# Patient Record
Sex: Male | Born: 1950 | Race: White | Hispanic: No | Marital: Married | Smoking: Never smoker
Health system: Southern US, Community
[De-identification: ages and names within clinical notes are randomized; demographics above are authoritative.]

## PROBLEM LIST (undated history)

## (undated) DIAGNOSIS — C44300 Unspecified malignant neoplasm of skin of unspecified part of face: Secondary | ICD-10-CM

## (undated) DIAGNOSIS — I712 Thoracic aortic aneurysm, without rupture: Secondary | ICD-10-CM

## (undated) DIAGNOSIS — I7121 Aneurysm of the ascending aorta, without rupture: Secondary | ICD-10-CM

## (undated) DIAGNOSIS — I359 Nonrheumatic aortic valve disorder, unspecified: Secondary | ICD-10-CM

## (undated) DIAGNOSIS — M7742 Metatarsalgia, left foot: Secondary | ICD-10-CM

## (undated) DIAGNOSIS — M25519 Pain in unspecified shoulder: Secondary | ICD-10-CM

## (undated) DIAGNOSIS — I2510 Atherosclerotic heart disease of native coronary artery without angina pectoris: Secondary | ICD-10-CM

## (undated) DIAGNOSIS — E079 Disorder of thyroid, unspecified: Secondary | ICD-10-CM

## (undated) DIAGNOSIS — I480 Paroxysmal atrial fibrillation: Secondary | ICD-10-CM

## (undated) DIAGNOSIS — M542 Cervicalgia: Secondary | ICD-10-CM

## (undated) DIAGNOSIS — J309 Allergic rhinitis, unspecified: Secondary | ICD-10-CM

## (undated) HISTORY — DX: Unspecified malignant neoplasm of skin of unspecified part of face: C44.300

## (undated) HISTORY — PX: EYE SURGERY: SHX253

## (undated) HISTORY — PX: CARDIAC CATHETERIZATION: SHX172

## (undated) HISTORY — DX: Pain in unspecified shoulder: M25.519

## (undated) HISTORY — DX: Metatarsalgia, left foot: M77.42

## (undated) HISTORY — DX: Allergic rhinitis, unspecified: J30.9

## (undated) HISTORY — DX: Cervicalgia: M54.2

---

## 1999-01-08 ENCOUNTER — Encounter: Admission: RE | Admit: 1999-01-08 | Discharge: 1999-01-08 | Payer: Self-pay | Admitting: Family Medicine

## 2000-05-01 ENCOUNTER — Encounter: Admission: RE | Admit: 2000-05-01 | Discharge: 2000-05-01 | Payer: Self-pay | Admitting: Sports Medicine

## 2000-06-01 ENCOUNTER — Encounter: Admission: RE | Admit: 2000-06-01 | Discharge: 2000-06-01 | Payer: Self-pay | Admitting: Sports Medicine

## 2000-06-20 ENCOUNTER — Encounter: Admission: RE | Admit: 2000-06-20 | Discharge: 2000-06-20 | Payer: Self-pay | Admitting: Family Medicine

## 2003-03-06 ENCOUNTER — Encounter: Admission: RE | Admit: 2003-03-06 | Discharge: 2003-03-06 | Payer: Self-pay | Admitting: Sports Medicine

## 2003-07-08 ENCOUNTER — Ambulatory Visit (HOSPITAL_COMMUNITY): Admission: RE | Admit: 2003-07-08 | Discharge: 2003-07-08 | Payer: Self-pay | Admitting: Sports Medicine

## 2006-11-09 DIAGNOSIS — J309 Allergic rhinitis, unspecified: Secondary | ICD-10-CM

## 2006-11-09 HISTORY — DX: Allergic rhinitis, unspecified: J30.9

## 2007-07-11 ENCOUNTER — Encounter: Payer: Self-pay | Admitting: Sports Medicine

## 2009-02-28 ENCOUNTER — Emergency Department (HOSPITAL_COMMUNITY): Admission: EM | Admit: 2009-02-28 | Discharge: 2009-02-28 | Payer: Self-pay | Admitting: Family Medicine

## 2009-02-28 ENCOUNTER — Emergency Department (HOSPITAL_COMMUNITY): Admission: EM | Admit: 2009-02-28 | Discharge: 2009-02-28 | Payer: Self-pay | Admitting: Emergency Medicine

## 2009-05-06 ENCOUNTER — Ambulatory Visit: Payer: Self-pay | Admitting: Sports Medicine

## 2009-05-06 DIAGNOSIS — M25519 Pain in unspecified shoulder: Secondary | ICD-10-CM

## 2009-05-06 DIAGNOSIS — S43109A Unspecified dislocation of unspecified acromioclavicular joint, initial encounter: Secondary | ICD-10-CM | POA: Insufficient documentation

## 2009-05-06 HISTORY — DX: Pain in unspecified shoulder: M25.519

## 2009-06-24 ENCOUNTER — Encounter: Payer: Self-pay | Admitting: Sports Medicine

## 2009-08-03 ENCOUNTER — Encounter: Payer: Self-pay | Admitting: Sports Medicine

## 2009-08-03 ENCOUNTER — Encounter: Admission: RE | Admit: 2009-08-03 | Discharge: 2009-08-03 | Payer: Self-pay | Admitting: Orthopedic Surgery

## 2009-08-04 ENCOUNTER — Encounter: Admission: RE | Admit: 2009-08-04 | Discharge: 2009-08-04 | Payer: Self-pay | Admitting: Orthopedic Surgery

## 2009-08-04 ENCOUNTER — Encounter: Payer: Self-pay | Admitting: Sports Medicine

## 2009-08-12 ENCOUNTER — Encounter: Payer: Self-pay | Admitting: Sports Medicine

## 2009-08-19 ENCOUNTER — Ambulatory Visit: Payer: Self-pay | Admitting: Sports Medicine

## 2009-08-19 DIAGNOSIS — S63529A Sprain of radiocarpal joint of unspecified wrist, initial encounter: Secondary | ICD-10-CM | POA: Insufficient documentation

## 2009-08-19 DIAGNOSIS — C44300 Unspecified malignant neoplasm of skin of unspecified part of face: Secondary | ICD-10-CM

## 2009-08-19 DIAGNOSIS — C44309 Unspecified malignant neoplasm of skin of other parts of face: Secondary | ICD-10-CM | POA: Insufficient documentation

## 2009-08-19 DIAGNOSIS — E041 Nontoxic single thyroid nodule: Secondary | ICD-10-CM | POA: Insufficient documentation

## 2009-08-19 HISTORY — DX: Unspecified malignant neoplasm of skin of unspecified part of face: C44.300

## 2009-08-19 LAB — CONVERTED CEMR LAB
T4, Total: 5.9 ug/dL (ref 5.0–12.5)
TSH: 6.346 microintl units/mL — ABNORMAL HIGH (ref 0.350–4.500)

## 2009-08-25 ENCOUNTER — Ambulatory Visit (HOSPITAL_COMMUNITY): Admission: RE | Admit: 2009-08-25 | Discharge: 2009-08-25 | Payer: Self-pay | Admitting: Sports Medicine

## 2009-08-27 ENCOUNTER — Ambulatory Visit: Payer: Self-pay | Admitting: Sports Medicine

## 2009-10-15 ENCOUNTER — Ambulatory Visit: Payer: Self-pay | Admitting: Family Medicine

## 2010-06-10 ENCOUNTER — Encounter: Payer: Self-pay | Admitting: Sports Medicine

## 2010-11-03 ENCOUNTER — Encounter: Payer: Self-pay | Admitting: *Deleted

## 2011-02-08 ENCOUNTER — Encounter: Payer: Self-pay | Admitting: Sports Medicine

## 2011-02-08 ENCOUNTER — Ambulatory Visit (INDEPENDENT_AMBULATORY_CARE_PROVIDER_SITE_OTHER): Payer: 59 | Admitting: Sports Medicine

## 2011-02-08 VITALS — BP 156/80 | HR 48 | Ht 68.0 in | Wt 135.0 lb

## 2011-02-08 DIAGNOSIS — S86919A Strain of unspecified muscle(s) and tendon(s) at lower leg level, unspecified leg, initial encounter: Secondary | ICD-10-CM | POA: Insufficient documentation

## 2011-02-08 DIAGNOSIS — IMO0002 Reserved for concepts with insufficient information to code with codable children: Secondary | ICD-10-CM

## 2011-02-08 NOTE — Assessment & Plan Note (Addendum)
Muscle strain of likely soleus muscle without evidence of tendon rupture.  Given instruction on eccentric heel exercises.  With excellent doppler flow I think we can hold off NTG at this point.consider restarting this in 2 weeks if no change  would hold running for at least 2 wks and then start slowly Use heel lifts

## 2011-02-08 NOTE — Progress Notes (Signed)
  Subjective:    Patient ID: James Mayo, male    DOB: 1950-09-24, 59 y.o.   MRN: [id number]  HPI 10 day history of right calf pain.  Acute onset while running.  No tripping or twisting, soreness despite icing afterwards.  Has been increasing walk-run and plans to do a triathlon in 2 weeks. Continued pain despite off x 1 week, pain when jogging with tightness in calves. Using some Dr Scholl's heel lifts and these do help.   Review of Systems See HPI    Objective:   Physical Exam Right Ankle/leg: No visible erythema or swelling. Range of motion is full in all directions or ankle, decreased flexibility of achilles No visible bruising or swelling Leg lengths, Right is .75 cm longer than leftresisted testing of post tib, flex hallus and flex digitorum all unremarkable Resisted testing of calf is neg Bent leg heel raise on 1 leg does hurt and fell tight on RT but not with straight leg  MSK [place]: Right Achilles tendon 0.54 At insertion of soleus into AT there is some disruption of distal mm fibers Hypoechoic change noted in this area There is also increased doppler flow AT is intact but is thicker than expected at 5 to 8 cms above insertion       Assessment & Plan:

## 2011-08-16 ENCOUNTER — Other Ambulatory Visit: Payer: Self-pay | Admitting: *Deleted

## 2011-08-16 MED ORDER — ZOSTER VACCINE LIVE 19400 UNT/0.65ML ~~LOC~~ SOLR: 0.6500 mL | Freq: Once | SUBCUTANEOUS | Status: AC

## 2011-11-14 ENCOUNTER — Other Ambulatory Visit: Payer: Self-pay | Admitting: Sports Medicine

## 2012-02-14 ENCOUNTER — Ambulatory Visit: Payer: 59 | Admitting: Sports Medicine

## 2012-02-16 ENCOUNTER — Ambulatory Visit (INDEPENDENT_AMBULATORY_CARE_PROVIDER_SITE_OTHER): Payer: 59 | Admitting: Sports Medicine

## 2012-02-16 VITALS — BP 128/70

## 2012-02-16 DIAGNOSIS — S86919A Strain of unspecified muscle(s) and tendon(s) at lower leg level, unspecified leg, initial encounter: Secondary | ICD-10-CM

## 2012-02-16 DIAGNOSIS — IMO0002 Reserved for concepts with insufficient information to code with codable children: Secondary | ICD-10-CM

## 2012-02-16 NOTE — Progress Notes (Signed)
  Subjective:    Patient ID: James Mayo, male    DOB: [birth year], 61 y.o.   MRN: [id number]  HPI 61 yo has a two month history of a crampy sensation upon training. He describes his training as a minute walk then a three minute jog and interval for 5 times.  He notes that after his second three minute run he starts to have a crampy pain in his right lateral lower leg that start at the lateral knee and radiates to the posterior leg.  The pain lasts for 24 hours and dissipates after 48 hours.     He was first evaluated by me for this one year ago. On ultrasound at that time he had a partial tear of the soleus muscle coming into the portion that comes Achilles tendon. He did cath exercises for while and use some compression and heel lifts. This improved his symptoms but they came back when he started his new running program.  Review of Systems     Objective:   Physical Exam NAD  Right gastrocnemius 1 1/2 cm smaller in circumference than the left gastrocnemius   5/5 strength bil in lower extremities. No loss of sensation in lower extremities bil  Leg length is normal  Cavus feet are noted bilaterally with marked widening of the forefoot  Running gait shows some intoeing of the left foot but not as bad as in the past and is generally neutral except he is not pushing off quite as well on the right leg       Assessment & Plan:

## 2012-02-16 NOTE — Assessment & Plan Note (Signed)
I think his muscle injury may have involved the sural nerve on the right calf as he has developed some significant atrophy His symptoms of tightness start fairly quickly and may represent some increased Sural irritation  We placed him in a calf compression sleeve He is to continue using heel lifts I gave him sports insoles to add a little bit of arch support  Start on calf exercise program and instructed in this  See how he responds to this with a gradual increase in his running as tolerated over the next one to 2 months

## 2013-05-09 ENCOUNTER — Other Ambulatory Visit: Payer: Self-pay | Admitting: Sports Medicine

## 2013-11-08 ENCOUNTER — Encounter: Payer: Self-pay | Admitting: Family Medicine

## 2013-11-08 ENCOUNTER — Ambulatory Visit (INDEPENDENT_AMBULATORY_CARE_PROVIDER_SITE_OTHER): Payer: 59 | Admitting: Family Medicine

## 2013-11-08 VITALS — BP 120/62 | HR 48 | Temp 97.8°F | Resp 16 | Wt 140.1 lb

## 2013-11-08 DIAGNOSIS — M999 Biomechanical lesion, unspecified: Secondary | ICD-10-CM | POA: Insufficient documentation

## 2013-11-08 DIAGNOSIS — M9981 Other biomechanical lesions of cervical region: Secondary | ICD-10-CM

## 2013-11-08 DIAGNOSIS — M542 Cervicalgia: Secondary | ICD-10-CM

## 2013-11-08 HISTORY — DX: Cervicalgia: M54.2

## 2013-11-08 NOTE — Progress Notes (Signed)
Pre visit review using our clinic review tool, if applicable. No additional management support is needed unless otherwise documented below in the visit note. 

## 2013-11-08 NOTE — Assessment & Plan Note (Signed)
Decision today to treat with OMT was based on Physical Exam  After verbal consent patient was treated with HVLA, ME techniques in cervical, thoracic, lumbar and sacral areas  Patient tolerated the procedure well with improvement in symptoms  Patient given exercises, stretches and lifestyle modifications  See medications in patient instructions if given  Patient will follow up in 3 weeks    

## 2013-11-08 NOTE — Patient Instructions (Signed)
Great to see you both Posture exercise heels, butt, shoulder and head against wall for goal for 5 minutes.  Y-T-A hold two seconds at each position one set of 10 reps daily.  Hip flexor stretch one knee down and then pelvis tilt forward feel in back and groin hold 10 seconds and repeat.  Take pills 3 times daily for 3 days.  Ice at end of activity if feeling tight.  Turmeric 500mg  twice daily Vitamin D 20000 IU daily.  Come back after your great

## 2013-11-08 NOTE — Progress Notes (Signed)
  [facility] [place],  [address] Phone: [phone] Subjective:     CC: neck pain left side and now both sides.   YJE:HUDJSHFWYO James Mayo is a 63 y.o. male coming in with complaint of neck pain mostly left-sided. Patient states it's been going on for multiple months. Seems to be worsening. Patient does not remember any true injury. Started more at the junction of the neck and the left shoulder and seem to radiate up towards his head now is bilateral. Denies any radiation in the arms, any numbness or weakness of the upper extremities. Patient states is very difficult to turn his head to the right side secondary to tightness he feels. Patient is an avid athlete and does have a bicycling trip to [place] where she will be riding 1200 miles in a week and states that he does not know if he is going to be able to tolerate it secondary to this pain. Patient rated 7/10 in severity. States that the pain does wake him up at night.     Past medical history, social, surgical and family history all reviewed in electronic medical record.   Review of Systems: No headache, visual changes, nausea, vomiting, diarrhea, constipation, dizziness, abdominal pain, skin rash, fevers, chills, night sweats, weight loss, swollen lymph nodes, body aches, joint swelling, muscle aches, chest pain, shortness of breath, mood changes.   Objective Blood pressure 120/62, pulse 48, temperature 97.8 F (36.6 C), temperature source Oral, resp. rate 16, weight 140 lb 1.9 oz (63.558 kg), SpO2 96.00%.  General: No apparent distress alert and oriented x3 mood and affect normal, dressed appropriately.  HEENT: Pupils equal, extraocular movements intact  Respiratory: Patient's speak in full sentences and does not appear short of breath  Cardiovascular: No lower extremity edema, non tender, no erythema  Skin: Warm dry intact with no signs of infection or rash on extremities or on axial  skeleton.  Abdomen: Soft nontender  Neuro: Cranial nerves II through XII are intact, neurovascularly intact in all extremities with 2+ DTRs and 2+ pulses.  Lymph: No lymphadenopathy of posterior or anterior cervical chain or axillae bilaterally.  Gait normal with good balance and coordination.  MSK:  Non tender with full range of motion and good stability and symmetric strength and tone of shoulders, elbows, wrist, hip, knee and ankles bilaterally.  Neck: Inspection unremarkable. Mild increase in lordosis No palpable stepoffs. Negative Spurling's maneuver. Limited range of motion with rotation to the right as well as side bending bilaterally. Full flexion and extension. Grip strength and sensation normal in bilateral hands Strength good C4 to T1 distribution No sensory change to C4 to T1 Negative Hoffman sign bilaterally Reflexes normal  OMT Physical Exam  Standing structural       Occiput  Shoulder right lower.   Inferior angle of scapula right lower.    Cervical  C2 flexed rotated and side bent left C7 flexed rotated and side bend right  Thoracic T1 rotated and side bend left and extension with elevated first rib T5 extended rotated and side bent left  Lumbar L2 flexed rotated inside that right significantly tight hip psoas muscle.  Sacrum Left on left     Impression and Recommendations:     This case required medical decision making of moderate complexity.

## 2013-11-08 NOTE — Assessment & Plan Note (Signed)
Patient's the pain is likely multifactorial with osteoarthritis as well as muscular skeletal complaints an elevated first rib Patient did respond to osteopathic manipulation. Patient was given different postural exercises and discussed sleeping position. Patient try these interventions and come back and see me again in 3-4 weeks. Likely will continue with osteopathic manipulation a regular basis.

## 2013-11-11 ENCOUNTER — Ambulatory Visit: Payer: 59 | Admitting: Family Medicine

## 2014-01-03 ENCOUNTER — Ambulatory Visit (INDEPENDENT_AMBULATORY_CARE_PROVIDER_SITE_OTHER): Payer: 59 | Admitting: Family Medicine

## 2014-01-03 VITALS — BP 112/72 | HR 55 | Wt 136.0 lb

## 2014-01-03 DIAGNOSIS — M542 Cervicalgia: Secondary | ICD-10-CM

## 2014-01-03 DIAGNOSIS — M999 Biomechanical lesion, unspecified: Secondary | ICD-10-CM

## 2014-01-03 DIAGNOSIS — M9981 Other biomechanical lesions of cervical region: Secondary | ICD-10-CM

## 2014-01-03 NOTE — Patient Instructions (Signed)
Good to see you and welcome back ! Monitor at eye level Two tennis balls duct tape together and put at base of skull and lay on it for 5 minutes if tension felt.  Continue exercises 3 times daily   See me again in 8 weeks.

## 2014-01-03 NOTE — Progress Notes (Signed)
  [facility] [place], [place] [address] Phone: [phone] Subjective:     CC: neck pain followup  James Mayo is a 63 y.o. male coming in  for followup of neck pain. Patient was seen previously and did have osteopathic manipulation as well as given home exercise program working on posture and back strengthening. Patient did go to [place] and did do a very long bicycle ride over the course multiple weeks. Patient was doing his exercises as well and has been back for 2 weeks. Patient states his pain is approximately 85% better and does have much more range of motion of his neck. Patient denies any radiation to his arms or any numbness or any new symptoms. Patient is very happy with the results so far. .     Past medical history, social, surgical and family history all reviewed in electronic medical record.   Review of Systems: No headache, visual changes, nausea, vomiting, diarrhea, constipation, dizziness, abdominal pain, skin rash, fevers, chills, night sweats, weight loss, swollen lymph nodes, body aches, joint swelling, muscle aches, chest pain, shortness of breath, mood changes.   Objective Blood pressure 112/72, pulse 55, weight 136 lb (61.689 kg), SpO2 97.00%.  General: No apparent distress alert and oriented x3 mood and affect normal, dressed appropriately.  HEENT: Pupils equal, extraocular movements intact  Respiratory: Patient's speak in full sentences and does not appear short of breath  Cardiovascular: No lower extremity edema, non tender, no erythema  Skin: Warm dry intact with no signs of infection or rash on extremities or on axial skeleton.  Abdomen: Soft nontender  Neuro: Cranial nerves II through XII are intact, neurovascularly intact in all extremities with 2+ DTRs and 2+ pulses.  Lymph: No lymphadenopathy of posterior or anterior cervical chain or axillae bilaterally.  Gait normal with good balance and  coordination.  MSK:  Non tender with full range of motion and good stability and symmetric strength and tone of shoulders, elbows, wrist, hip, knee and ankles bilaterally.  Neck: Inspection unremarkable. Mild increase in lordosis No palpable stepoffs. Negative Spurling's maneuver. Significant more rotation as well as side bending than previous exam. Patient does have some increased lordosis of the cervical spine. Full flexion and extension. Grip strength and sensation normal in bilateral hands Strength good C4 to T1 distribution No sensory change to C4 to T1 Negative Hoffman sign bilaterally Reflexes normal  OMT Physical Exam   Cervical  C2 flexed rotated and side bent left C7 flexed rotated and side bend right  Thoracic T1 rotated and side bend left and extension with elevated first rib T5 extended rotated and side bent left  Lumbar L2 flexed rotated inside that right significantly tight hip psoas muscle.  Sacrum Left on left     Impression and Recommendations:     This case required medical decision making of moderate complexity.

## 2014-01-04 ENCOUNTER — Encounter: Payer: Self-pay | Admitting: Family Medicine

## 2014-01-04 NOTE — Assessment & Plan Note (Signed)
Patient is doing considerably better. Patient does have likely a significant amount of osteoarthritic changes but I do not think this would change management at this time. Patient is responding well to conservative therapy. Patient will continue with this. Patient will stay active and will followup in 8 weeks for further evaluation and treatment.

## 2014-01-04 NOTE — Assessment & Plan Note (Addendum)
Decision today to treat with OMT was based on Physical Exam  After verbal consent patient was treated with HVLA, ME techniques in cervical, thoracic, lumbar and sacral areas  Patient tolerated the procedure well with improvement in symptoms  Patient given exercises, stretches and lifestyle modifications  See medications in patient instructions if given  Patient will follow up in 8 weeks

## 2014-02-28 ENCOUNTER — Ambulatory Visit: Payer: 59 | Admitting: Family Medicine

## 2014-04-29 ENCOUNTER — Encounter: Payer: Self-pay | Admitting: Sports Medicine

## 2014-04-29 ENCOUNTER — Ambulatory Visit (INDEPENDENT_AMBULATORY_CARE_PROVIDER_SITE_OTHER): Payer: 59 | Admitting: Sports Medicine

## 2014-04-29 VITALS — BP 127/75 | Ht 68.0 in | Wt 140.0 lb

## 2014-04-29 DIAGNOSIS — M7742 Metatarsalgia, left foot: Secondary | ICD-10-CM

## 2014-04-29 DIAGNOSIS — M7750 Other enthesopathy of unspecified foot: Secondary | ICD-10-CM

## 2014-04-29 DIAGNOSIS — M7741 Metatarsalgia, right foot: Secondary | ICD-10-CM

## 2014-04-29 HISTORY — DX: Metatarsalgia, left foot: M77.42

## 2014-04-29 NOTE — Progress Notes (Signed)
   Subjective:    Patient ID: James Mayo, male    DOB: 1951/03/24, 63 y.o.   MRN: [id number]  HPI Mr. Sparr is a 63 year old male who presents for left foot pain and pressure when he is biking beyond 30 miles. He he notes onset of symptoms was approximately one month ago following a bike trip to [place]. He notes that he had been biking at least 70 miles per day for one month continuously. 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 He says that his current pain feels different, as he does not feel a discrete nodule, numbness, tingling, or reproducibility with palpation. He notes no known relieving factors, nor has he tried anything. He does notice that he has a bunion deformity on the left, though this is not the region of his pain.  No past medical history on file. History   Social History  . Marital Status: Married    Spouse Name: N/A    Number of Children: N/A  . Years of Education: N/A   Occupational History  . Not on file.   Social History Main Topics  . Smoking status: Never Smoker   . Smokeless tobacco: Never Used  . Alcohol Use: Not on file  . Drug Use: Not on file  . Sexual Activity: Not on file   Other Topics Concern  . Not on file   Social History Narrative  . No narrative on file    Review of Systems As per history of present illness. 11 point review of systems was performed is otherwise negative.    Objective:   Physical Exam BP 127/75  Ht 5\' 8"  (1.727 m)  Wt 140 lb (63.504 kg)  BMI 21.29 kg/m2 GEN: The patient is well-developed well-nourished male and in no acute distress.  He is awake alert and oriented x3. SKIN: warm and well-perfused, no rash  EXTR: No lower extremity edema or calf tenderness Neuro: Strength 5/5 globally. Sensation intact throughout. DTRs 2/4 bilaterally. No focal deficits. Vasc: +2 bilateral distal pulses. No edema.  MSK: Examination of the  left foot reveals a slight bunion deformity of approximately 20 of valgus deviation at the first MTP joint. He has a slight pes planus bilaterally. Negative metatarsal compression test. No palpable masses in the inter webspace of the metatarsal heads. Healed incision is seen between the second and third metatarsals. No heel pain or pain with passive dorsiflexion of the foot. No pain with standing on tiptoes or with walking barefoot.     Assessment & Plan:  Please see problem based assessment in the problem list.

## 2014-04-29 NOTE — Assessment & Plan Note (Addendum)
Presents only when the patient has been cycling for 30 miles or longer. Without significant numbness or tingling. -Today we applied a metatarsal pad to the inferior surface of his bike sole. We also applied a Morton's neuroma pad between the first and second metatarsal, which the patient reported felt comfortable. -He will continue his cycling and see how these interventions work with her leaving some of the pain and pressure in the area of the left foot. -We will see him back in approximately one month if his symptoms persist or sooner if he desires to be reevaluated.

## 2014-08-05 ENCOUNTER — Ambulatory Visit (INDEPENDENT_AMBULATORY_CARE_PROVIDER_SITE_OTHER): Payer: Commercial Managed Care - PPO | Admitting: Physician Assistant

## 2014-08-05 VITALS — BP 120/62 | HR 49 | Temp 98.2°F | Resp 16 | Ht 67.5 in | Wt 137.0 lb

## 2014-08-05 DIAGNOSIS — Z130 Encounter for screening for diseases of the blood and blood-forming organs and certain disorders involving the immune mechanism: Secondary | ICD-10-CM

## 2014-08-05 DIAGNOSIS — Z1389 Encounter for screening for other disorder: Secondary | ICD-10-CM

## 2014-08-05 DIAGNOSIS — E039 Hypothyroidism, unspecified: Secondary | ICD-10-CM

## 2014-08-05 DIAGNOSIS — Z0000 Encounter for general adult medical examination without abnormal findings: Secondary | ICD-10-CM

## 2014-08-05 DIAGNOSIS — Z13220 Encounter for screening for lipoid disorders: Secondary | ICD-10-CM

## 2014-08-05 DIAGNOSIS — Z1329 Encounter for screening for other suspected endocrine disorder: Secondary | ICD-10-CM

## 2014-08-05 DIAGNOSIS — Z23 Encounter for immunization: Secondary | ICD-10-CM

## 2014-08-05 LAB — CBC WITH DIFFERENTIAL/PLATELET
BASOS PCT: 1 % (ref 0–1)
Basophils Absolute: 0.1 10*3/uL (ref 0.0–0.1)
EOS PCT: 0 % (ref 0–5)
Eosinophils Absolute: 0 10*3/uL (ref 0.0–0.7)
HEMATOCRIT: 42.2 % (ref 39.0–52.0)
HEMOGLOBIN: 14.4 g/dL (ref 13.0–17.0)
LYMPHS PCT: 26 % (ref 12–46)
Lymphs Abs: 1.5 10*3/uL (ref 0.7–4.0)
MCH: 33.5 pg (ref 26.0–34.0)
MCHC: 34.1 g/dL (ref 30.0–36.0)
MCV: 98.1 fL (ref 78.0–100.0)
MONO ABS: 0.4 10*3/uL (ref 0.1–1.0)
MONOS PCT: 7 % (ref 3–12)
MPV: 11.6 fL (ref 9.4–12.4)
NEUTROS ABS: 3.7 10*3/uL (ref 1.7–7.7)
Neutrophils Relative %: 66 % (ref 43–77)
Platelets: 188 10*3/uL (ref 150–400)
RBC: 4.3 MIL/uL (ref 4.22–5.81)
RDW: 13.4 % (ref 11.5–15.5)
WBC: 5.6 10*3/uL (ref 4.0–10.5)

## 2014-08-05 LAB — TSH: TSH: 6.843 u[IU]/mL — AB (ref 0.350–4.500)

## 2014-08-05 LAB — COMPREHENSIVE METABOLIC PANEL
ALT: 16 U/L (ref 0–53)
AST: 27 U/L (ref 0–37)
Albumin: 4.2 g/dL (ref 3.5–5.2)
Alkaline Phosphatase: 50 U/L (ref 39–117)
BILIRUBIN TOTAL: 0.9 mg/dL (ref 0.2–1.2)
BUN: 30 mg/dL — AB (ref 6–23)
CALCIUM: 8.9 mg/dL (ref 8.4–10.5)
CHLORIDE: 103 meq/L (ref 96–112)
CO2: 26 mEq/L (ref 19–32)
CREATININE: 1.07 mg/dL (ref 0.50–1.35)
Glucose, Bld: 96 mg/dL (ref 70–99)
Potassium: 4.3 mEq/L (ref 3.5–5.3)
Sodium: 136 mEq/L (ref 135–145)
Total Protein: 6.1 g/dL (ref 6.0–8.3)

## 2014-08-05 LAB — LIPID PANEL
CHOL/HDL RATIO: 2.4 ratio
CHOLESTEROL: 195 mg/dL (ref 0–200)
HDL: 81 mg/dL (ref >39)
LDL CALC: 101 mg/dL — AB (ref 0–99)
TRIGLYCERIDES: 63 mg/dL (ref <150)
VLDL: 13 mg/dL (ref 0–40)

## 2014-08-05 LAB — T4, FREE: FREE T4: 0.94 ng/dL (ref 0.80–1.80)

## 2014-08-05 NOTE — Progress Notes (Addendum)
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  SUBJECTIVE  Chief Complaint: Annual Exam   History of present illness: James Mayo is a 63 y.o. year old male who presents for an annual exam.   He has not had the seasonal flu vaccine and would like that today.  He is not eligible for pneumococal vaccine at this time.  He has received a shingles vaccine in 2013.    He has a history of hypothyroidism diagnosed in the summer of 2010.  He is not sure if he still needs this medication and would like to have his thyroid checked today.  He does not take an ASA daily.  He is a non smoker.  His blood pressure is well controlled.  He does report that he had some hypertension 8 years ago, but he reduced his salt intake and his BP returned to normal levels, and has been normal since.    He had a colonoscopy two years ago. He was negative for polyps at that time. He was ask to have another screening in ten years     He does not want his prostate screened at this time.    He has been  married over 30 years in a monogamous relationship and denies a history of IV drug use.    He reports exercising 6 days a week at roughly 90 mins per session. He also walks the dog daily.   He has a Paediatric nurse and dentist whom he sees yearly.  He does not have an eye doctor.    He  has a past medical history of SHOULDER PAIN, RIGHT (05/06/2009); Neck pain, bilateral (11/08/2013); BASAL CELL CARCINOMA, FACE (08/19/2009); RHINITIS, ALLERGIC (11/09/2006); BASAL CELL CARCINOMA, FACE (08/19/2009); and Metatarsalgia of left foot (04/29/2014).    He has a current medication list which includes the following prescription(s): levothyroxine.  James Mayo has No Known Allergies. He  reports that he has never smoked. He has never used smokeless tobacco. He  has no sexual activity history on file.  The patient  has no past surgical  history on file.  His family history is not on file.  Review of Systems  Constitutional: Negative for fever, chills, weight loss, malaise/fatigue and diaphoresis.  HENT: Negative for congestion, ear discharge, ear pain, hearing loss, nosebleeds, sore throat and tinnitus.   Eyes: Negative for blurred vision, double vision, photophobia, pain, discharge and redness.  Respiratory: Negative for cough, hemoptysis, shortness of breath, wheezing and stridor.   Cardiovascular: Negative for chest pain, palpitations, orthopnea, leg swelling and PND.  Gastrointestinal: Negative for heartburn, nausea, vomiting, abdominal pain, diarrhea, constipation, blood in stool and melena.  Genitourinary: Negative for dysuria, urgency, frequency, hematuria and flank pain.  Musculoskeletal: Positive for back pain. Negative for myalgias, joint pain, falls and neck pain.  Skin: Negative for itching and rash.  Neurological: Negative for dizziness, tingling, tremors, weakness and headaches.  Endo/Heme/Allergies: Negative for environmental allergies and polydipsia. Does not bruise/bleed easily.  Psychiatric/Behavioral: Negative for depression and suicidal ideas. The patient is not nervous/anxious and does not have insomnia.     OBJECTIVE  Blood pressure 120/62, pulse 49, temperature 98.2 F (36.8 C), temperature source Oral, resp. rate 16, height 5' 7.5" (1.715 m), weight 137 lb (62.143 kg), SpO2 99 %. The patient's body mass index is 21.13 kg/(m^2).  Physical Exam  Constitutional: He is oriented to person, place, and time. He  appears well-developed and well-nourished. No distress.  HENT:  Head: Normocephalic and atraumatic.  Right Ear: Hearing, tympanic membrane and external ear normal.  Left Ear: Hearing, tympanic membrane and external ear normal.  Nose: Nose normal.  Mouth/Throat: Oropharynx is clear and moist. No oropharyngeal exudate.  Eyes: Conjunctivae and EOM are normal. Pupils are equal, round, and reactive  to light. No scleral icterus.  Neck: Normal range of motion. No thyromegaly present.  Cardiovascular: Normal rate, regular rhythm, normal heart sounds and intact distal pulses.   Respiratory: Effort normal and breath sounds normal.  GI: Soft. Bowel sounds are normal.  Musculoskeletal: Normal range of motion.  Lymphadenopathy:    He has no cervical adenopathy.  Neurological: He is alert and oriented to person, place, and time. He has normal reflexes.  Skin: Skin is warm and dry. He is not diaphoretic.  Psychiatric: He has a normal mood and affect. His behavior is normal. Judgment and thought content normal.      Results for orders placed or performed in visit on 08/05/14 (from the past 24 hour(s))  TSH     Status: Abnormal   Collection Time: 08/05/14  1:18 PM  Result Value Ref Range   TSH 6.843 (H) 0.350 - 4.500 uIU/mL   Narrative   Performed at:  [address], [address]                [place], [place] [address]  T4, Free     Status: None   Collection Time: 08/05/14  1:18 PM  Result Value Ref Range   Free T4 0.94 0.80 - 1.80 ng/dL   Narrative   Performed at:  [address], [address]                [place], [place] [address]  Comprehensive metabolic panel     Status: Abnormal   Collection Time: 08/05/14  1:18 PM  Result Value Ref Range   Sodium 136 135 - 145 mEq/L   Potassium 4.3 3.5 - 5.3 mEq/L   Chloride 103 96 - 112 mEq/L   CO2 26 19 - 32 mEq/L   Glucose, Bld 96 70 - 99 mg/dL   BUN 30 (H) 6 - 23 mg/dL   Creat 1.07 0.50 - 1.35 mg/dL   Total Bilirubin 0.9 0.2 - 1.2 mg/dL   Alkaline Phosphatase 50 39 - 117 U/L   AST 27 0 - 37 U/L   ALT 16 0 - 53 U/L   Total Protein 6.1 6.0 - 8.3 g/dL   Albumin 4.2 3.5 - 5.2 g/dL   Calcium 8.9 8.4 - 10.5 mg/dL   Narrative   Performed at:  [address], [address]                [place], [place] [address]  CBC with Differential      Status: None   Collection Time: 08/05/14  1:18 PM  Result Value Ref Range   WBC 5.6 4.0 - 10.5 K/uL   RBC 4.30 4.22 - 5.81 MIL/uL   Hemoglobin 14.4 13.0 - 17.0 g/dL   HCT 42.2 39.0 - 52.0 %   MCV 98.1 78.0 - 100.0 fL   MCH  33.5 26.0 - 34.0 pg   MCHC 34.1 30.0 - 36.0 g/dL   RDW 13.4 11.5 - 15.5 %   Platelets 188 150 - 400 K/uL   Neutrophils Relative % 66 43 - 77 %   Neutro Abs 3.7 1.7 - 7.7 K/uL   Lymphocytes Relative 26 12 - 46 %   Lymphs Abs 1.5 0.7 - 4.0 K/uL   Monocytes Relative 7 3 - 12 %   Monocytes Absolute 0.4 0.1 - 1.0 K/uL   Eosinophils Relative 0 0 - 5 %   Eosinophils Absolute 0.0 0.0 - 0.7 K/uL   Basophils Relative 1 0 - 1 %   Basophils Absolute 0.1 0.0 - 0.1 K/uL   Smear Review Criteria for review not met    MPV 11.6 9.4 - 12.4 fL   Narrative   Performed at:  [address], [address]                [place], [place] [address]  Lipid panel     Status: Abnormal   Collection Time: 08/05/14  1:18 PM  Result Value Ref Range   Cholesterol 195 0 - 200 mg/dL   Triglycerides 63 <150 mg/dL   HDL 81 >39 mg/dL   Total CHOL/HDL Ratio 2.4 Ratio   VLDL 13 0 - 40 mg/dL   LDL Cholesterol 101 (H) 0 - 99 mg/dL   Narrative   Performed at:  [facility], [address]                [place], [place] [address]    ASSESSMENT & PLAN  James Mayo was seen today for annual exam.  Diagnoses and associated orders for this visit:  Physical exam  Screening for cholesterol level - Lipid panel  Screening for nephropathy - Comprehensive metabolic panel  Screening for thyroid disorder - TSH - T4, Free  Screening for deficiency anemia - CBC with Differential -     MCV approaching macrocytosis, however is normal at this time.  It is unknown weather this is chronic or acute.  Patient reports eating one to two large salads with dark leafy green vegetables daily.  Will reevaluate with an CBC in 6 weeks.    Need for  prophylactic vaccination and inoculation against influenza - Flu Vaccine QUAD 36+ mos IM  Hypothyroidism, unspecified hypothyroidism type -     TSH elevated well out of proportion to normal.  Free T4 low normal. After speaking with patient, 75 mcg or levothyroxine was ordered.  Patient advised to call or come back to clinic with symptoms of constipation, dry skin, anxiety and tachycardia.  -      Future orders for TSH and Free T4, 6 weeks from today, have been ordered      The patient was instructed to to call or comeback to clinic as needed, or should symptoms warrant.  Philis Fendt, MHS, PA-C [facility] 08/06/2014 8:34 AM

## 2014-08-05 NOTE — Progress Notes (Signed)
The patient was discussed with me and I agree with the diagnosis and treatment plan.  

## 2014-08-06 ENCOUNTER — Encounter: Payer: Self-pay | Admitting: Physician Assistant

## 2014-08-06 DIAGNOSIS — E039 Hypothyroidism, unspecified: Secondary | ICD-10-CM | POA: Insufficient documentation

## 2014-08-06 MED ORDER — LEVOTHYROXINE SODIUM 75 MCG PO TABS: 75.0000 ug | ORAL_TABLET | Freq: Every day | ORAL | Status: DC

## 2014-08-06 NOTE — Addendum Note (Signed)
Addended by: Tereasa Coop on: 08/06/2014 09:25 AM   Modules accepted: Orders, SmartSet

## 2015-02-26 ENCOUNTER — Ambulatory Visit (INDEPENDENT_AMBULATORY_CARE_PROVIDER_SITE_OTHER): Payer: 59 | Admitting: Sports Medicine

## 2015-02-26 ENCOUNTER — Encounter: Payer: Self-pay | Admitting: Sports Medicine

## 2015-02-26 VITALS — BP 140/79 | Ht 68.0 in | Wt 135.0 lb

## 2015-02-26 DIAGNOSIS — M7741 Metatarsalgia, right foot: Secondary | ICD-10-CM

## 2015-02-26 NOTE — Progress Notes (Signed)
  James Mayo - 64 y.o. male MRN [id number]  Date of birth: Jan 15, 1951  SUBJECTIVE: Including CC, HPI, ROS HISTORY:  CC: Left forefoot pain, reevaluation   HPI: Forefoot pain at begins after approximately 2 hours of riding, 30-35 miles. He first noticed this while on extended bike trip in [place] last year and has been having recurrent symptoms with this throughout the year if going greater than 2 hours. Pain is located between the first and second interspace and described as a match or flame underneath his toe. Pain is better after rest. Does not occur during other times of the day. Has tried additional metatarsal padding without significant improvement. Pain was improved with ibuprofen but is not taking this on a regular basis. No new changes in bike position, shoes or other equipment.  ROS: per HPI   No specialty comments available. Social History   Occupational History  . Not on file.   Social History Main Topics  . Smoking status: Never Smoker   . Smokeless tobacco: Never Used  . Alcohol Use: Not on file  . Drug Use: Not on file  . Sexual Activity: Not on file      Problem  Metatarsalgia of Right Foot   Transverse arch breakdown. Causing "HotSpot" during cycling. Cleat position is neutral/posterior (appropriate). Specialzied shoes.     OBJECTIVE: HT:5\' 8"  (172.7 cm) WT:135 lb (61.236 kg) BMI:20.6 BP:140/79 mmHg HR: bpm TEMP: ( ) RESP:  PHYSICAL EXAM: GENERAL: Adult male, athletic. No acute distress PSYCH: Alert and appropriately interactive. SKIN: No open skin lesions or abnormal skin markings on areas inspected as below VASCULAR: DP & PT pulses 2+/4. No pre-tibial edema NEURO: Lower extremity strength is 5+/5 in all myotomes; sensation is intact to light touch in all dermatomes. BILATERAL FEET:  Significant left transverse arch rate down with onion formation on the left only. Early Morton's callus. Positive Mulder's click bilaterally but nonpainful. Moderate  longitudinal arch that is maintained with weightbearing. Neutral calcaneus at rest with moderate posterior tibialis recruitment .     ASSESSMENT & PLAN: See Patient instructions for additional information Problem List Items Addressed This Visit    Metatarsalgia of right foot - Primary      PROCEDURE NOTE : CUSTOM ORTHOTICS - sample IGLI All Arounder  The patient was fitted for a cushioned IGLI orthotic . Patient was placed in this patient was placed in the prone position ankle was brought to subtalar neutral and carbon orthotic was heated with a heat gun and molded to the foot. Additional transverse arch button on left was added in ground down to comfort . No other additional postings provided. Patient reported comfort will at rest and will trial these bike ride today. >50% of this 30 minute visit spent in direct patient counseling and/or coordination of care.   FOLLOW UP:  Return if symptoms worsen or fail to improve.

## 2015-07-22 ENCOUNTER — Other Ambulatory Visit: Payer: Self-pay | Admitting: *Deleted

## 2015-07-22 DIAGNOSIS — M79669 Pain in unspecified lower leg: Secondary | ICD-10-CM

## 2015-07-22 DIAGNOSIS — M545 Low back pain, unspecified: Secondary | ICD-10-CM

## 2015-08-17 ENCOUNTER — Other Ambulatory Visit: Payer: Self-pay | Admitting: Physician Assistant

## 2015-08-31 ENCOUNTER — Telehealth: Payer: Self-pay

## 2015-08-31 NOTE — Telephone Encounter (Signed)
-----   Message from Tereasa Coop, PA-C sent at 08/12/2015  9:10 AM EST ----- Regarding: Overdue labs Can we call this patient and find out if he gave [place] blood or if [facility] has dropped the ball?  If he has not given labs then we may need to schedule an appointment to improve compliance.  Philis Fendt, MS, PA-C 9:11 AM, 08/12/2015   ----- Message -----    From: SYSTEM    Sent: 08/12/2015  12:04 AM      To: Tereasa Coop, PA-C

## 2015-08-31 NOTE — Telephone Encounter (Signed)
See previous labs- LMOM to CB

## 2015-09-15 NOTE — Telephone Encounter (Signed)
Spoke with pt. He had forgotten about it. He is going to set up an appt with Legrand Como for a CPE

## 2015-09-16 ENCOUNTER — Ambulatory Visit (INDEPENDENT_AMBULATORY_CARE_PROVIDER_SITE_OTHER): Payer: 59 | Admitting: Physician Assistant

## 2015-09-16 ENCOUNTER — Encounter: Payer: Self-pay | Admitting: Physician Assistant

## 2015-09-16 VITALS — BP 121/68 | HR 51 | Temp 99.4°F | Resp 16 | Ht 67.75 in | Wt 136.4 lb

## 2015-09-16 DIAGNOSIS — Z418 Encounter for other procedures for purposes other than remedying health state: Secondary | ICD-10-CM

## 2015-09-16 DIAGNOSIS — Z0000 Encounter for general adult medical examination without abnormal findings: Secondary | ICD-10-CM

## 2015-09-16 DIAGNOSIS — Z760 Encounter for issue of repeat prescription: Secondary | ICD-10-CM | POA: Diagnosis not present

## 2015-09-16 DIAGNOSIS — Z299 Encounter for prophylactic measures, unspecified: Secondary | ICD-10-CM

## 2015-09-16 DIAGNOSIS — Z23 Encounter for immunization: Secondary | ICD-10-CM

## 2015-09-16 DIAGNOSIS — Z139 Encounter for screening, unspecified: Secondary | ICD-10-CM | POA: Diagnosis not present

## 2015-09-16 LAB — COMPLETE METABOLIC PANEL WITH GFR
ALBUMIN: 4.2 g/dL (ref 3.6–5.1)
ALT: 16 U/L (ref 9–46)
AST: 24 U/L (ref 10–35)
Alkaline Phosphatase: 60 U/L (ref 40–115)
BILIRUBIN TOTAL: 1.1 mg/dL (ref 0.2–1.2)
BUN: 24 mg/dL (ref 7–25)
CO2: 28 mmol/L (ref 20–31)
CREATININE: 0.9 mg/dL (ref 0.70–1.25)
Calcium: 9.7 mg/dL (ref 8.6–10.3)
Chloride: 102 mmol/L (ref 98–110)
GFR, Est African American: >89 mL/min (ref >=60)
GLUCOSE: 102 mg/dL — AB (ref 65–99)
Potassium: 4.3 mmol/L (ref 3.5–5.3)
SODIUM: 139 mmol/L (ref 135–146)
TOTAL PROTEIN: 6.5 g/dL (ref 6.1–8.1)

## 2015-09-16 LAB — LIPID PANEL
Cholesterol: 186 mg/dL (ref 125–200)
HDL: 96 mg/dL (ref >=40)
LDL CALC: 81 mg/dL (ref <130)
Total CHOL/HDL Ratio: 1.9 Ratio (ref <=5.0)
Triglycerides: 46 mg/dL (ref <150)
VLDL: 9 mg/dL (ref <30)

## 2015-09-16 LAB — TSH: TSH: 11.87 u[IU]/mL — ABNORMAL HIGH (ref 0.350–4.500)

## 2015-09-16 MED ORDER — LEVOTHYROXINE SODIUM 75 MCG PO TABS: 75.0000 ug | ORAL_TABLET | Freq: Every day | ORAL | Status: DC

## 2015-09-16 MED FILL — LEVOTHYROXINE 75 MCG TABLET: 75 | 90 days supply | Qty: 90 | Fill #0

## 2015-09-16 NOTE — Progress Notes (Signed)
09/16/2015 3:47 PM   DOB: Dec 27, 1950 / MRN: [id number]  SUBJECTIVE:  James Mayo is a 65 y.o. male presenting for an annual physical.  He feels well today and denies complaint. He exercises daily via swimming, cycling, lifting and running, and these sessions last roughly 1 to 1.5 hours.  He eats healthily. He has a history of hypothyroidism.  He would like a flu shot and is due for a TDAP and would like this today.   His last colonoscopy was three years ago and negative for polyps. He is a never smoker.  He declines PSA screening today but would like to talk to his wife, and if he decides to screen he would like to monitor the PSA velocity.     Depression screen PHQ 2/9 09/16/2015  Decreased Interest 0  Down, Depressed, Hopeless 0  PHQ - 2 Score 0     Immunization History  Administered Date(s) Administered  . Influenza,inj,Quad PF,36+ Mos 08/05/2014, 09/16/2015  . Tdap 09/16/2015    He has No Known Allergies.   He  has a past medical history of SHOULDER PAIN, RIGHT (05/06/2009); Neck pain, bilateral (11/08/2013); BASAL CELL CARCINOMA, FACE (08/19/2009); RHINITIS, ALLERGIC (11/09/2006); BASAL CELL CARCINOMA, FACE (08/19/2009); and Metatarsalgia of left foot (04/29/2014).    He  reports that he has never smoked. He has never used smokeless tobacco. He reports that he drinks about 1.2 oz of alcohol per week. He  has no sexual activity history on file. The patient  has past surgical history that includes Eye surgery (Bilateral).  His family history is not on file.  Review of Systems  Constitutional: Negative for fever and chills.  Eyes: Negative for blurred vision.  Respiratory: Negative for cough and shortness of breath.   Cardiovascular: Negative for chest pain.  Gastrointestinal: Negative for nausea and abdominal pain.  Genitourinary: Negative for dysuria, urgency and frequency.  Musculoskeletal: Negative for myalgias.  Skin: Negative for rash.  Neurological: Negative for dizziness,  tingling and headaches.  Psychiatric/Behavioral: Negative for depression. The patient is not nervous/anxious.     Problem list and medications reviewed and updated by myself where necessary, and exist elsewhere in the encounter.   OBJECTIVE:  BP 121/68 mmHg  Pulse 51  Temp(Src) 99.4 F (37.4 C) (Oral)  Resp 16  Ht 5' 7.75" (1.721 m)  Wt 136 lb 6.4 oz (61.871 kg)  BMI 20.89 kg/m2  SpO2 97%  Physical Exam  Constitutional: He is oriented to person, place, and time. He appears well-developed. He does not appear ill.  Eyes: Conjunctivae and EOM are normal. Pupils are equal, round, and reactive to light.  Cardiovascular: Normal rate, regular rhythm, normal heart sounds and intact distal pulses.   Pulmonary/Chest: Effort normal and breath sounds normal.  Abdominal: He exhibits no distension.  Musculoskeletal: Normal range of motion.  Neurological: He is alert and oriented to person, place, and time. No cranial nerve deficit. Coordination normal.  Skin: Skin is warm and dry. He is not diaphoretic.  Psychiatric: He has a normal mood and affect.  Nursing note and vitals reviewed.   No results found for this or any previous visit (from the past 48 hour(s)).  [place] was seen today for annual exam and medication refill.  Diagnoses and all orders for this visit:  Annual physical exam: He is doing very well. He exercises daily and has a resting bradycardia.  No colonoscopy exist in [facility] however he did receive this three years ago and  had no polyps. He will bring documentation for scanning purposes.   Will hold on PSA screening for now given the risk.  Advised that if he changes his mind after speaking with his wife will add on to today's labs and all he needs to do is call.    Screening -     HIV antibody -     TSH -     Hepatitis C antibody -     Hemoglobin A1c -     COMPLETE METABOLIC PANEL WITH GFR -     Lipid panel  Need for prophylactic measure -     Tdap  vaccine greater than or equal to 7yo IM -     Flu Vaccine QUAD 36+ mos IM  Other orders -     Cancel: Lipid panel -     Cancel: Ambulatory referral to Gastroenterology    The patient was advised to call or return to clinic if he does not see an improvement in symptoms or to seek the care of the closest emergency department if he worsens with the above plan.   Philis Fendt, MHS, PA-C [facility] 09/16/2015 3:47 PM

## 2015-09-17 LAB — HEMOGLOBIN A1C
Hgb A1c MFr Bld: 5.7 % — ABNORMAL HIGH (ref <5.7)
MEAN PLASMA GLUCOSE: 117 mg/dL — AB (ref <117)

## 2015-09-17 LAB — HEPATITIS C ANTIBODY: HCV Ab: NEGATIVE

## 2015-09-17 LAB — HIV ANTIBODY (ROUTINE TESTING W REFLEX): HIV 1&2 Ab, 4th Generation: NONREACTIVE

## 2015-09-21 ENCOUNTER — Other Ambulatory Visit: Payer: Self-pay | Admitting: Physician Assistant

## 2015-09-21 DIAGNOSIS — R7989 Other specified abnormal findings of blood chemistry: Secondary | ICD-10-CM

## 2015-09-21 MED ORDER — LEVOTHYROXINE SODIUM 25 MCG PO TABS: 25.0000 ug | ORAL_TABLET | Freq: Every day | ORAL | Status: DC

## 2015-10-07 MED FILL — LEVOTHYROXINE 25 MCG TABLET: 25 | 90 days supply | Qty: 90 | Fill #0

## 2015-10-08 ENCOUNTER — Encounter: Payer: Self-pay | Admitting: *Deleted

## 2015-10-08 ENCOUNTER — Encounter: Payer: Self-pay | Admitting: Physician Assistant

## 2015-10-14 ENCOUNTER — Ambulatory Visit (INDEPENDENT_AMBULATORY_CARE_PROVIDER_SITE_OTHER): Payer: 59 | Admitting: Family Medicine

## 2015-10-14 ENCOUNTER — Encounter: Payer: Self-pay | Admitting: Family Medicine

## 2015-10-14 VITALS — BP 124/78 | HR 55 | Temp 98.8°F | Resp 17 | Ht 67.75 in | Wt 137.0 lb

## 2015-10-14 DIAGNOSIS — R5383 Other fatigue: Secondary | ICD-10-CM

## 2015-10-14 LAB — POCT CBC
Granulocyte percent: 75.5 %G (ref 37–80)
HCT, POC: 44 % (ref 43.5–53.7)
Hemoglobin: 15 g/dL (ref 14.1–18.1)
Lymph, poc: 1.3 (ref 0.6–3.4)
MCH, POC: 32.7 pg — AB (ref 27–31.2)
MCHC: 34.1 g/dL (ref 31.8–35.4)
MCV: 96 fL (ref 80–97)
MID (cbc): 0.4 (ref 0–0.9)
MPV: 7.3 fL (ref 0–99.8)
POC Granulocyte: 5.3 (ref 2–6.9)
POC LYMPH PERCENT: 19.1 %L (ref 10–50)
POC MID %: 5.4 %M (ref 0–12)
Platelet Count, POC: 206 10*3/uL (ref 142–424)
RBC: 4.58 M/uL — AB (ref 4.69–6.13)
RDW, POC: 13.2 %
WBC: 7 10*3/uL (ref 4.6–10.2)

## 2015-10-14 MED ORDER — AMOXICILLIN-POT CLAVULANATE 875-125 MG PO TABS: 1.0000 | ORAL_TABLET | Freq: Two times a day (BID) | ORAL | Status: DC

## 2015-10-14 MED FILL — AMOX TR-K CLV 875-125 MG TA: 875-125 | 10 days supply | Qty: 20 | Fill #0

## 2015-10-14 NOTE — Progress Notes (Addendum)
   Subjective:    Patient ID: James Mayo, male    DOB: 1951-02-11, 65 y.o.   MRN: [id number] By signing my name below, I, Zola Button, attest that this documentation has been prepared under the direction and in the presence of Robyn Haber, MD.  Electronically Signed: Zola Button, Medical Scribe. 10/14/2015. 1:25 PM.  HPI HPI Comments: James Mayo is a 65 y.o. male with a history of hypothyroidism who presents to the [facility] complaining of gradual onset, generalized, mild fatigue only with exercise that started about a month ago. Patient notes his symptoms were the worst about a week ago, when he began having chills and a subjective fever. He states that he has been having to increase his physical exertion for his normal exercise routine; his day-to-day activities have not been affected. Patient denies SOB, appetite changes, abdominal pain, cough, and congestion. He has not had any difficulty sleeping. His TSH was elevated during his annual physical exam with Philis Fendt, PA-C so the dosage of his thyroid medications were changed at that time.   Review of Systems  Constitutional: Positive for fever, chills and fatigue. Negative for appetite change.  HENT: Negative for congestion.   Respiratory: Negative for cough and shortness of breath.   Gastrointestinal: Negative for abdominal pain.  Psychiatric/Behavioral: Negative for sleep disturbance.       Objective:   Physical Exam CONSTITUTIONAL: Well developed/well nourished HEAD: Normocephalic/atraumatic EYES: EOM/PERRL ENMT: Mucous membranes moist NECK: supple no meningeal signs. No thyromegaly. SPINE: entire spine nontender CV: S1/S2 noted, no rubs/gallops noted. Soft murmur of the right sternal border without radiation. LUNGS: Lungs are clear to auscultation bilaterally, no apparent distress ABDOMEN: soft, nontender, no rebound or guarding. No splenomegaly. GU: no cva tenderness NEURO: Pt is awake/alert,  moves all extremitiesx4 EXTREMITIES: pulses normal, full ROM SKIN: warm, color normal PSYCH: no abnormalities of mood noted  Results for orders placed or performed in visit on 10/14/15  POCT CBC  Result Value Ref Range   WBC 7.0 4.6 - 10.2 K/uL   Lymph, poc 1.3 0.6 - 3.4   POC LYMPH PERCENT 19.1 10 - 50 %L   MID (cbc) 0.4 0 - 0.9   POC MID % 5.4 0 - 12 %M   POC Granulocyte 5.3 2 - 6.9   Granulocyte percent 75.5 37 - 80 %G   RBC 4.58 (A) 4.69 - 6.13 M/uL   Hemoglobin 15.0 14.1 - 18.1 g/dL   HCT, POC 44.0 43.5 - 53.7 %   MCV 96.0 80 - 97 fL   MCH, POC 32.7 (A) 27 - 31.2 pg   MCHC 34.1 31.8 - 35.4 g/dL   RDW, POC 13.2 %   Platelet Count, POC 206 142 - 424 K/uL   MPV 7.3 0 - 99.8 fL         Assessment & Plan:   This chart was scribed in my presence and reviewed by me personally.    ICD-9-CM ICD-10-CM   1. Other fatigue 780.79 R53.83 POCT CBC     amoxicillin-clavulanate (AUGMENTIN) 875-125 MG tablet     Signed, Robyn Haber, MD

## 2015-11-25 ENCOUNTER — Other Ambulatory Visit (INDEPENDENT_AMBULATORY_CARE_PROVIDER_SITE_OTHER): Payer: 59 | Admitting: *Deleted

## 2015-11-25 DIAGNOSIS — R7989 Other specified abnormal findings of blood chemistry: Secondary | ICD-10-CM

## 2015-11-25 DIAGNOSIS — R946 Abnormal results of thyroid function studies: Secondary | ICD-10-CM | POA: Diagnosis not present

## 2015-11-26 LAB — T4, FREE: FREE T4: 1.3 ng/dL (ref 0.8–1.8)

## 2015-11-26 LAB — T3, FREE: T3, Free: 2.8 pg/mL (ref 2.3–4.2)

## 2015-11-26 LAB — TSH: TSH: 3.13 mIU/L (ref 0.40–4.50)

## 2015-12-23 ENCOUNTER — Other Ambulatory Visit: Payer: Self-pay | Admitting: Physician Assistant

## 2015-12-24 MED FILL — LEVOTHYROXINE 75 MCG TABLET: 75 | 90 days supply | Qty: 90 | Fill #1

## 2016-02-22 DIAGNOSIS — C44319 Basal cell carcinoma of skin of other parts of face: Secondary | ICD-10-CM | POA: Diagnosis not present

## 2016-02-22 DIAGNOSIS — L820 Inflamed seborrheic keratosis: Secondary | ICD-10-CM | POA: Diagnosis not present

## 2016-02-22 DIAGNOSIS — L570 Actinic keratosis: Secondary | ICD-10-CM | POA: Diagnosis not present

## 2016-02-22 DIAGNOSIS — D225 Melanocytic nevi of trunk: Secondary | ICD-10-CM | POA: Diagnosis not present

## 2016-02-22 DIAGNOSIS — L821 Other seborrheic keratosis: Secondary | ICD-10-CM | POA: Diagnosis not present

## 2016-02-22 DIAGNOSIS — Z85828 Personal history of other malignant neoplasm of skin: Secondary | ICD-10-CM | POA: Diagnosis not present

## 2016-02-22 DIAGNOSIS — D485 Neoplasm of uncertain behavior of skin: Secondary | ICD-10-CM | POA: Diagnosis not present

## 2016-03-16 MED FILL — LEVOTHYROXINE 75 MCG TABLET: 75 | 90 days supply | Qty: 90 | Fill #2

## 2016-05-05 DIAGNOSIS — L570 Actinic keratosis: Secondary | ICD-10-CM | POA: Diagnosis not present

## 2016-06-02 MED FILL — LEVOTHYROXINE 75 MCG TABLET: 75 | 90 days supply | Qty: 90 | Fill #3

## 2016-06-06 DIAGNOSIS — C44319 Basal cell carcinoma of skin of other parts of face: Secondary | ICD-10-CM | POA: Diagnosis not present

## 2016-08-18 DIAGNOSIS — L570 Actinic keratosis: Secondary | ICD-10-CM | POA: Diagnosis not present

## 2016-08-25 ENCOUNTER — Ambulatory Visit (INDEPENDENT_AMBULATORY_CARE_PROVIDER_SITE_OTHER): Payer: 59 | Admitting: Physician Assistant

## 2016-08-25 ENCOUNTER — Encounter: Payer: Self-pay | Admitting: Physician Assistant

## 2016-08-25 VITALS — BP 110/78 | HR 52 | Temp 98.5°F | Resp 16 | Ht 67.75 in | Wt 134.0 lb

## 2016-08-25 DIAGNOSIS — Z1389 Encounter for screening for other disorder: Secondary | ICD-10-CM

## 2016-08-25 DIAGNOSIS — Z13220 Encounter for screening for lipoid disorders: Secondary | ICD-10-CM | POA: Diagnosis not present

## 2016-08-25 DIAGNOSIS — Z131 Encounter for screening for diabetes mellitus: Secondary | ICD-10-CM

## 2016-08-25 DIAGNOSIS — Z23 Encounter for immunization: Secondary | ICD-10-CM | POA: Diagnosis not present

## 2016-08-25 DIAGNOSIS — Z1329 Encounter for screening for other suspected endocrine disorder: Secondary | ICD-10-CM

## 2016-08-25 DIAGNOSIS — E039 Hypothyroidism, unspecified: Secondary | ICD-10-CM

## 2016-08-25 DIAGNOSIS — Z130 Encounter for screening for diseases of the blood and blood-forming organs and certain disorders involving the immune mechanism: Secondary | ICD-10-CM | POA: Diagnosis not present

## 2016-08-25 DIAGNOSIS — Z0000 Encounter for general adult medical examination without abnormal findings: Secondary | ICD-10-CM | POA: Diagnosis not present

## 2016-08-25 MED FILL — LEVOTHYROXINE 75 MCG TABLET: 75 | 90 days supply | Qty: 90 | Fill #4

## 2016-08-25 NOTE — Progress Notes (Addendum)
08/25/2016 1:57 PM   DOB: 12-Dec-1950 / MRN: [id number]  SUBJECTIVE:  James Mayo is a 65 y.o. male presenting for an annual physical.  He does aerobic exercise for about 10 hours a week.  Never smoker.  Drinks 3-5 beers a week and can only drink two at a time. Takes thyroid medication.    He has a history of skin cancer and sees a dermatologist 1-2 times per year. Jari Pigg).  He does wear sunscreen if he is out side.   Depression screen PHQ 2/9 08/25/2016  Decreased Interest 0  Down, Depressed, Hopeless 0  PHQ - 2 Score 0     Immunization History  Administered Date(s) Administered  . Influenza,inj,Quad PF,36+ Mos 08/05/2014, 09/16/2015  . Tdap 09/16/2015   He has No Known Allergies.   He  has a past medical history of BASAL CELL CARCINOMA, FACE (08/19/2009); BASAL CELL CARCINOMA, FACE (08/19/2009); Metatarsalgia of left foot (04/29/2014); Neck pain, bilateral (11/08/2013); RHINITIS, ALLERGIC (11/09/2006); and SHOULDER PAIN, RIGHT (05/06/2009).    He  reports that he has never smoked. He has never used smokeless tobacco. He reports that he drinks about 1.2 oz of alcohol per week . He  has no sexual activity history on file. The patient  has a past surgical history that includes Eye surgery (Bilateral).  His family history is not on file.  Review of Systems  Constitutional: Negative for chills and fever.  Eyes: Negative for blurred vision.  Respiratory: Negative for cough and shortness of breath.   Cardiovascular: Negative for chest pain and leg swelling.  Gastrointestinal: Negative for abdominal pain and nausea.  Genitourinary: Negative for dysuria, frequency and urgency.  Musculoskeletal: Negative for myalgias.  Skin: Negative for rash.  Neurological: Negative for dizziness, tingling and headaches.  Psychiatric/Behavioral: Negative for depression. The patient is not nervous/anxious.     The problem list and medications were reviewed and updated by myself where necessary and  exist elsewhere in the encounter.   OBJECTIVE:  BP 110/78 (BP Location: Right Arm, Patient Position: Sitting, Cuff Size: Small)   Pulse (!) 52   Temp 98.5 F (36.9 C) (Oral)   Resp 16   Ht 5' 7.75" (1.721 m)   Wt 134 lb (60.8 kg)   SpO2 99%   BMI 20.53 kg/m   Physical Exam  Constitutional: He is oriented to person, place, and time. He appears well-developed. He does not appear ill.  Eyes: Conjunctivae and EOM are normal. Pupils are equal, round, and reactive to light.  Cardiovascular: Normal rate.   Pulmonary/Chest: Effort normal and breath sounds normal.  Abdominal: He exhibits no distension and no mass. There is no tenderness. There is no rebound and no guarding.  Musculoskeletal: Normal range of motion.  Neurological: He is alert and oriented to person, place, and time. No cranial nerve deficit. Coordination normal.  Skin: Skin is warm and dry. He is not diaphoretic.  Psychiatric: He has a normal mood and affect.  Nursing note and vitals reviewed.  Lab Results  Component Value Date   TSH 3.13 11/25/2015   Lab Results  Component Value Date   ALT 16 09/16/2015   AST 24 09/16/2015   ALKPHOS 60 09/16/2015   BILITOT 1.1 09/16/2015   Lab Results  Component Value Date   CREATININE 0.90 09/16/2015    No results found for this or any previous visit (from the past 72 hour(s)).  No results found.  [place] was seen today for annual  exam.  Diagnoses and all orders for this visit:  Annual physical exam: He is incredibly healthy.  Needs flu and first pneumo shot today.  Up to date otherwise.   Screening for deficiency anemia -     CBC  Screening for nephropathy -     CMP14+EGFR  Screening for lipid disorders -     Lipid panel  Screening for thyroid disorder -     TSH  Screening for diabetes mellitus -     Hemoglobin A1c  Hypothyroidism unspecified and elevated TSH: See phone encounter on 09/12/16.        The patient is advised to call  or return to clinic if he does not see an improvement in symptoms, or to seek the care of the closest emergency department if he worsens with the above plan.   Philis Fendt, MHS, PA-C [facility] 08/25/2016 1:57 PM

## 2016-08-26 LAB — HEMOGLOBIN A1C
ESTIMATED AVERAGE GLUCOSE: 117 mg/dL
HEMOGLOBIN A1C: 5.7 % — AB (ref 4.8–5.6)

## 2016-08-26 LAB — CBC
HEMATOCRIT: 43.8 % (ref 37.5–51.0)
HEMOGLOBIN: 15.1 g/dL (ref 13.0–17.7)
MCH: 33.4 pg — AB (ref 26.6–33.0)
MCHC: 34.5 g/dL (ref 31.5–35.7)
MCV: 97 fL (ref 79–97)
Platelets: 220 10*3/uL (ref 150–379)
RBC: 4.52 x10E6/uL (ref 4.14–5.80)
RDW: 13.7 % (ref 12.3–15.4)
WBC: 6 10*3/uL (ref 3.4–10.8)

## 2016-08-26 LAB — CMP14+EGFR
A/G RATIO: 2.5 — AB (ref 1.2–2.2)
ALBUMIN: 5 g/dL — AB (ref 3.6–4.8)
ALK PHOS: 52 IU/L (ref 39–117)
ALT: 20 IU/L (ref 0–44)
AST: 36 IU/L (ref 0–40)
BILIRUBIN TOTAL: 1 mg/dL (ref 0.0–1.2)
BUN / CREAT RATIO: 25 — AB (ref 10–24)
BUN: 23 mg/dL (ref 8–27)
CO2: 26 mmol/L (ref 18–29)
CREATININE: 0.92 mg/dL (ref 0.76–1.27)
Calcium: 9.9 mg/dL (ref 8.6–10.2)
Chloride: 101 mmol/L (ref 96–106)
GFR calc Af Amer: 101 mL/min/{1.73_m2} (ref >59)
GFR calc non Af Amer: 87 mL/min/{1.73_m2} (ref >59)
GLOBULIN, TOTAL: 2 g/dL (ref 1.5–4.5)
Glucose: 90 mg/dL (ref 65–99)
POTASSIUM: 4.7 mmol/L (ref 3.5–5.2)
SODIUM: 144 mmol/L (ref 134–144)
Total Protein: 7 g/dL (ref 6.0–8.5)

## 2016-08-26 LAB — LIPID PANEL
CHOLESTEROL TOTAL: 195 mg/dL (ref 100–199)
Chol/HDL Ratio: 1.9 ratio units (ref 0.0–5.0)
HDL: 101 mg/dL (ref >39)
LDL CALC: 85 mg/dL (ref 0–99)
TRIGLYCERIDES: 44 mg/dL (ref 0–149)
VLDL Cholesterol Cal: 9 mg/dL (ref 5–40)

## 2016-08-26 LAB — TSH: TSH: 4.69 u[IU]/mL — ABNORMAL HIGH (ref 0.450–4.500)

## 2016-09-12 ENCOUNTER — Telehealth: Payer: Self-pay | Admitting: Physician Assistant

## 2016-09-12 DIAGNOSIS — E039 Hypothyroidism, unspecified: Secondary | ICD-10-CM

## 2016-09-12 DIAGNOSIS — R7989 Other specified abnormal findings of blood chemistry: Secondary | ICD-10-CM

## 2016-09-12 MED ORDER — LEVOTHYROXINE SODIUM 75 MCG PO TABS: 75.0000 ug | ORAL_TABLET | Freq: Every day | ORAL | 1 refills | Status: DC

## 2016-09-12 NOTE — Telephone Encounter (Signed)
Please call and let him know that his TSH is mildly elevated.  I'd like to maintain his current dose for the next six months and then have him come in for a recheck as I'd prefer not to increase his medication for such a mild increase.

## 2016-09-13 MED FILL — LEVOTHYROXINE 75 MCG TABLET: 75 | 90 days supply | Qty: 90 | Fill #0

## 2016-09-13 NOTE — Telephone Encounter (Signed)
Left message to return call for lab results.

## 2016-09-20 DIAGNOSIS — L570 Actinic keratosis: Secondary | ICD-10-CM | POA: Diagnosis not present

## 2016-09-22 ENCOUNTER — Encounter: Payer: Self-pay | Admitting: Emergency Medicine

## 2016-11-22 ENCOUNTER — Telehealth: Payer: Self-pay | Admitting: Physician Assistant

## 2016-11-22 ENCOUNTER — Ambulatory Visit (INDEPENDENT_AMBULATORY_CARE_PROVIDER_SITE_OTHER): Payer: 59 | Admitting: Physician Assistant

## 2016-11-22 VITALS — BP 140/78 | HR 56 | Temp 98.6°F | Resp 16 | Ht 67.75 in | Wt 138.0 lb

## 2016-11-22 DIAGNOSIS — C44612 Basal cell carcinoma of skin of right upper limb, including shoulder: Secondary | ICD-10-CM | POA: Diagnosis not present

## 2016-11-22 DIAGNOSIS — R5383 Other fatigue: Secondary | ICD-10-CM

## 2016-11-22 DIAGNOSIS — Z23 Encounter for immunization: Secondary | ICD-10-CM | POA: Diagnosis not present

## 2016-11-22 DIAGNOSIS — I517 Cardiomegaly: Secondary | ICD-10-CM

## 2016-11-22 DIAGNOSIS — R6883 Chills (without fever): Secondary | ICD-10-CM

## 2016-11-22 DIAGNOSIS — R52 Pain, unspecified: Secondary | ICD-10-CM | POA: Diagnosis not present

## 2016-11-22 DIAGNOSIS — R9431 Abnormal electrocardiogram [ECG] [EKG]: Secondary | ICD-10-CM

## 2016-11-22 DIAGNOSIS — Z85828 Personal history of other malignant neoplasm of skin: Secondary | ICD-10-CM | POA: Diagnosis not present

## 2016-11-22 DIAGNOSIS — L570 Actinic keratosis: Secondary | ICD-10-CM | POA: Diagnosis not present

## 2016-11-22 DIAGNOSIS — L821 Other seborrheic keratosis: Secondary | ICD-10-CM | POA: Diagnosis not present

## 2016-11-22 DIAGNOSIS — D485 Neoplasm of uncertain behavior of skin: Secondary | ICD-10-CM | POA: Diagnosis not present

## 2016-11-22 LAB — POCT URINALYSIS DIP (MANUAL ENTRY)
Bilirubin, UA: NEGATIVE
Glucose, UA: NEGATIVE
Ketones, POC UA: NEGATIVE
Leukocytes, UA: NEGATIVE
NITRITE UA: NEGATIVE
PH UA: 5.5
Protein Ur, POC: NEGATIVE
Spec Grav, UA: 1.01
Urobilinogen, UA: 0.2

## 2016-11-22 MED ORDER — AMOXICILLIN-POT CLAVULANATE 875-125 MG PO TABS: 1.0000 | ORAL_TABLET | Freq: Two times a day (BID) | ORAL | 0 refills | Status: AC

## 2016-11-22 MED ORDER — AMOXICILLIN-POT CLAVULANATE 875-125 MG PO TABS: 1.0000 | ORAL_TABLET | Freq: Two times a day (BID) | ORAL | 0 refills | Status: DC

## 2016-11-22 MED FILL — AMOX-CLAV 875-125 MG TABLET: 875-125 | 10 days supply | Qty: 20 | Fill #0

## 2016-11-22 NOTE — Progress Notes (Signed)
PRIMARY CARE AT [place], [place] [address] [phone]  Date:  11/22/2016   Name:  James Mayo   DOB:  April 15, 1951   MRN:  [id number]  PCP:  Kathlen Brunswick, PA-C    History of Present Illness:  James Mayo is a 66 y.o. male patient who presents to PCP with  Chief Complaint  Patient presents with  . Chills    Pt states he has symptoms for about 7 weeks   . Generalized Body Aches  . Fatigue   Patient reports flu-like symptoms for 7 weeks.  Chills and aching, but the symptoms would return.  He has another bout of these symptoms.  Night sweats.  Feels feverish but is around 98.6.  Body aches are generalized. He exercises frequently per week, he feels much more fatigue.  Feels like 70%.  He feels more hot than achey.  He has no coughing.  Mild sore throat.  No ear pain. No dysuria, hematuria, frequency, or abdominal pain.   No black or blood in stooll No sob, or chest pains.   Non-smoker.  No procedures on the thyroid.  He is compliant on the synthroid. He has no fever.     Patient Active Problem List   Diagnosis Date Noted  . Metatarsalgia of right foot 02/26/2015  . Hypothyroidism 08/06/2014  . 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 BASAL CELL CARCINOMA, FACE 08/19/2009   Qualifier: Diagnosis of By: Oneida Alar MD, KARL    . BASAL CELL CARCINOMA, FACE 08/19/2009   Qualifier: Diagnosis of  By: Oneida Alar MD, KARL    . Metatarsalgia of left foot 04/29/2014  . Neck pain, bilateral 11/08/2013  . RHINITIS, ALLERGIC 11/09/2006   Qualifier: Diagnosis of  By: Samara Snide    . SHOULDER PAIN, RIGHT 05/06/2009   Qualifier: Diagnosis of  By: Oneida Alar MD, KARL      Past Surgical History:  Procedure Laterality Date  . EYE SURGERY Bilateral    LASIK 10 years    Social History  Substance Use Topics  . Smoking status: Never Smoker  . Smokeless tobacco: Never Used  . Alcohol use 1.2 oz/week    2 Standard drinks or equivalent per week   Comment: BEER    No family history on file.  No Known Allergies  Medication list has been reviewed and updated.  Current Outpatient Prescriptions on File Prior to Visit  Medication Sig Dispense Refill  . levothyroxine (SYNTHROID) 75 MCG tablet Take 1 tablet (75 mcg total) by mouth daily before breakfast. Please come in for lab draw only in June 18. 90 tablet 1   No current facility-administered medications on file prior to visit.     ROS ROS otherwise unremarkable unless listed above.  Physical Examination: BP 140/78   Pulse (!) 56   Temp 98.6 F (37 C) (Oral)   Resp 16   Ht 5' 7.75" (1.721 m)   Wt 138 lb (62.6 kg)   SpO2 99%   BMI 21.14 kg/m  Ideal Body Weight: Weight in (lb) to have BMI = 25: 162.9  Physical Exam  Constitutional: He is oriented to person, place, and time. He appears well-developed and well-nourished. No distress.  HENT:  Head: Normocephalic and atraumatic.  Right Ear: Tympanic membrane, external ear and ear canal normal.  Left Ear: Tympanic membrane, external ear and ear canal normal.  Nose: No mucosal edema or rhinorrhea.  Mouth/Throat: No oropharyngeal exudate or posterior oropharyngeal  edema.  Eyes: Conjunctivae and EOM are normal. Pupils are equal, round, and reactive to light.  Cardiovascular: Regular rhythm.  Bradycardia present.  Exam reveals no friction rub.   Murmur (grade 3 systolic murmur appreciated at the right sternal border.) heard. Pulses:      Carotid pulses are 2+ on the right side, and 2+ on the left side.      Radial pulses are 2+ on the right side, and 2+ on the left side.       Dorsalis pedis pulses are 2+ on the right side, and 2+ on the left side.  Pulmonary/Chest: Effort normal. No accessory muscle usage. No apnea. No respiratory distress. He has no decreased breath sounds. He has no wheezes. He has no rhonchi.  Neurological: He is alert and oriented to person, place, and time.  Skin: Skin is warm and dry. He is not  diaphoretic.  Psychiatric: He has a normal mood and affect. His behavior is normal.     Assessment and Plan: James Mayo is a 66 y.o. male who is here today for flu-like symptoms.   We will recheck his tsh today.   Possible aging process, hypothyroidism, malignancy, pulmonary etiology, mono, cardiac etiology.   Due to abnormal ekg, cardiology referral appreciated at this time.    Chills - Plan: TSH, CBC with Differential/Platelet, Sedimentation Rate, CMP14+EGFR, EKG 12-Lead, POCT urinalysis dipstick  Other fatigue - Plan: TSH, CBC with Differential/Platelet, Sedimentation Rate, CMP14+EGFR, EKG 12-Lead, POCT urinalysis dipstick  Body aches - Plan: TSH, CBC with Differential/Platelet, Sedimentation Rate, CMP14+EGFR, EKG 12-Lead, POCT urinalysis dipstick  Nonspecific abnormal electrocardiogram (ECG) (EKG) - Plan: Ambulatory referral to Cardiology  LVH (left ventricular hypertrophy) - Plan: Ambulatory referral to Cardiology  Ivar Drape, PA-C [facility] 3/14/201811:36 AM

## 2016-11-22 NOTE — Telephone Encounter (Signed)
[facility] called to state that the medication that was called in to be cancelled the patient has already picked it up and they have tried to call and leave him a message that he should not take it

## 2016-11-22 NOTE — Patient Instructions (Addendum)
  I will have your lab results shortly. Please await contact for the cardiology referral.      IF you received an x-ray today, you will receive an invoice from [facility]. Please contact [facility] at [phone] with questions or concerns regarding your invoice.   IF you received labwork today, you will receive an invoice from [facility]. Please contact [facility] at [phone] with questions or concerns regarding your invoice.   Our billing staff will not be able to assist you with questions regarding bills from these companies.  You will be contacted with the lab results as soon as they are available. The fastest way to get your results is to activate your My Chart account. Instructions are located on the last page of this paperwork. If you have not heard from [place] regarding the results in 2 weeks, please contact this office.

## 2016-11-22 NOTE — Telephone Encounter (Signed)
resolved 

## 2016-11-23 LAB — CBC WITH DIFFERENTIAL/PLATELET
Basophils Absolute: 0 10*3/uL (ref 0.0–0.2)
Basos: 0 %
EOS (ABSOLUTE): 0 10*3/uL (ref 0.0–0.4)
Eos: 1 %
HEMATOCRIT: 44.6 % (ref 37.5–51.0)
Hemoglobin: 15.1 g/dL (ref 13.0–17.7)
Immature Grans (Abs): 0 10*3/uL (ref 0.0–0.1)
Immature Granulocytes: 0 %
LYMPHS ABS: 1.3 10*3/uL (ref 0.7–3.1)
Lymphs: 20 %
MCH: 32.8 pg (ref 26.6–33.0)
MCHC: 33.9 g/dL (ref 31.5–35.7)
MCV: 97 fL (ref 79–97)
MONOS ABS: 0.5 10*3/uL (ref 0.1–0.9)
Monocytes: 8 %
NEUTROS PCT: 71 %
Neutrophils Absolute: 4.7 10*3/uL (ref 1.4–7.0)
Platelets: 187 10*3/uL (ref 150–379)
RBC: 4.6 x10E6/uL (ref 4.14–5.80)
RDW: 14.1 % (ref 12.3–15.4)
WBC: 6.6 10*3/uL (ref 3.4–10.8)

## 2016-11-23 LAB — CMP14+EGFR
ALBUMIN: 4.2 g/dL (ref 3.6–4.8)
ALK PHOS: 47 IU/L (ref 39–117)
ALT: 15 IU/L (ref 0–44)
AST: 23 IU/L (ref 0–40)
Albumin/Globulin Ratio: 2 (ref 1.2–2.2)
BILIRUBIN TOTAL: 0.8 mg/dL (ref 0.0–1.2)
BUN / CREAT RATIO: 19 (ref 10–24)
BUN: 18 mg/dL (ref 8–27)
CHLORIDE: 102 mmol/L (ref 96–106)
CO2: 24 mmol/L (ref 18–29)
Calcium: 9.8 mg/dL (ref 8.6–10.2)
Creatinine, Ser: 0.93 mg/dL (ref 0.76–1.27)
GFR calc Af Amer: 99 mL/min/{1.73_m2} (ref >59)
GFR calc non Af Amer: 86 mL/min/{1.73_m2} (ref >59)
GLUCOSE: 112 mg/dL — AB (ref 65–99)
Globulin, Total: 2.1 g/dL (ref 1.5–4.5)
Potassium: 4.7 mmol/L (ref 3.5–5.2)
SODIUM: 139 mmol/L (ref 134–144)
Total Protein: 6.3 g/dL (ref 6.0–8.5)

## 2016-11-23 LAB — SEDIMENTATION RATE: Sed Rate: 2 mm/hr (ref 0–30)

## 2016-11-23 LAB — TSH: TSH: 5.87 u[IU]/mL — ABNORMAL HIGH (ref 0.450–4.500)

## 2016-12-23 ENCOUNTER — Ambulatory Visit (INDEPENDENT_AMBULATORY_CARE_PROVIDER_SITE_OTHER): Payer: 59 | Admitting: Interventional Cardiology

## 2016-12-23 ENCOUNTER — Encounter: Payer: Self-pay | Admitting: Interventional Cardiology

## 2016-12-23 VITALS — BP 142/80 | HR 53 | Resp 16 | Ht 68.0 in | Wt 135.8 lb

## 2016-12-23 DIAGNOSIS — E039 Hypothyroidism, unspecified: Secondary | ICD-10-CM

## 2016-12-23 DIAGNOSIS — R011 Cardiac murmur, unspecified: Secondary | ICD-10-CM | POA: Diagnosis not present

## 2016-12-23 DIAGNOSIS — R9431 Abnormal electrocardiogram [ECG] [EKG]: Secondary | ICD-10-CM | POA: Diagnosis not present

## 2016-12-23 NOTE — Patient Instructions (Signed)
Medication Instructions:  Your physician recommends that you continue on your current medications as directed. Please refer to the Current Medication list given to you today.   Labwork: None ordered  Testing/Procedures: Your physician has requested that you have an echocardiogram. Echocardiography is a painless test that uses sound waves to create images of your heart. It provides your doctor with information about the size and shape of your heart and how well your heart's chambers and valves are working. This procedure takes approximately one hour. There are no restrictions for this procedure.    Follow-Up: As needed   Any Other Special Instructions Will Be Listed Below (If Applicable).  Echocardiogram An echocardiogram, or echocardiography, uses sound waves (ultrasound) to produce an image of your heart. The echocardiogram is simple, painless, obtained within a short period of time, and offers valuable information to your health care provider. The images from an echocardiogram can provide information such as:  Evidence of coronary artery disease (CAD).  Heart size.  Heart muscle function.  Heart valve function.  Aneurysm detection.  Evidence of a past heart attack.  Fluid buildup around the heart.  Heart muscle thickening.  Assess heart valve function. Tell a health care provider about:  Any allergies you have.  All medicines you are taking, including vitamins, herbs, eye drops, creams, and over-the-counter medicines.  Any problems you or family members have had with anesthetic medicines.  Any blood disorders you have.  Any surgeries you have had.  Any medical conditions you have.  Whether you are pregnant or may be pregnant. What happens before the procedure? No special preparation is needed. Eat and drink normally. What happens during the procedure?  In order to produce an image of your heart, gel will be applied to your chest and a wand-like tool  (transducer) will be moved over your chest. The gel will help transmit the sound waves from the transducer. The sound waves will harmlessly bounce off your heart to allow the heart images to be captured in real-time motion. These images will then be recorded.  You may need an IV to receive a medicine that improves the quality of the pictures. What happens after the procedure? You may return to your normal schedule including diet, activities, and medicines, unless your health care provider tells you otherwise. This information is not intended to replace advice given to you by your health care provider. Make sure you discuss any questions you have with your health care provider. Document Released: 08/26/2000 Document Revised: 04/16/2016 Document Reviewed: 05/06/2013 Elsevier Interactive Patient Education  2017 [employer].    If you need a refill on your cardiac medications before your next appointment, please call your pharmacy.

## 2016-12-23 NOTE — Progress Notes (Signed)
Cardiology Office Note   Date:  12/23/2016   ID:  James Mayo, DOB 12/07/1950, MRN [id number]  PCP:  Kathlen Brunswick, PA-C    No chief complaint on file. ? Enlarged heart   Wt Readings from Last 3 Encounters:  12/23/16 135 lb 12.8 oz (61.6 kg)  11/22/16 138 lb (62.6 kg)  08/25/16 134 lb (60.8 kg)       History of Present Illness: James Mayo is a 66 y.o. male  who has a history of hypothyroidism. He is referred to our office due to an abnormal ECG. He was told that he had an enlarged heart. He has been very athletic for many years. He does endurance training.  Of the past 10 weeks, he has had some flulike symptoms. He is still having some minor chills and dizziness as well. He feels he is about 80% of full strength. He also feels that his exercise tolerance has decreased slightly. He continues to swim and bike and lift weights. His only lab abnormality has been an elevated TSH.  Although, below his usual standard, he is still exercising at a high level.  No chest pain or SHOB.   He heats healthy as well.  James Mayo is a 65 y.o. male who is being seen today for the evaluation of enlarged heart by ECG at the request of English, Stephanie D, [place].    Past Medical History:  Diagnosis Date  . BASAL CELL CARCINOMA, FACE 08/19/2009   Qualifier: Diagnosis of By: Oneida Alar MD, KARL    . BASAL CELL CARCINOMA, FACE 08/19/2009   Qualifier: Diagnosis of  By: Oneida Alar MD, KARL    . Metatarsalgia of left foot 04/29/2014  . Neck pain, bilateral 11/08/2013  . RHINITIS, ALLERGIC 11/09/2006   Qualifier: Diagnosis of  By: Samara Snide    . SHOULDER PAIN, RIGHT 05/06/2009   Qualifier: Diagnosis of  By: Oneida Alar MD, KARL      Past Surgical History:  Procedure Laterality Date  . EYE SURGERY Bilateral    LASIK 10 years     Current Outpatient Prescriptions  Medication Sig Dispense Refill  . levothyroxine (SYNTHROID) 75 MCG tablet Take 1 tablet (75 mcg total) by mouth daily before  breakfast. Please come in for lab draw only in June 18. 90 tablet 1   No current facility-administered medications for this visit.     Allergies:   Patient has no known allergies.    Social History:  The patient  reports that he has never smoked. He has never used smokeless tobacco. He reports that he drinks about 1.2 oz of alcohol per week .   Family History:  The patient's family history includes Hypertension in his mother; Mental illness in his brother.    ROS:  Please see the history of present illness.   Otherwise, review of systems are positive for mild fatigue.   All other systems are reviewed and negative.    PHYSICAL EXAM: VS:  BP (!) 142/80   Pulse (!) 53   Resp 16   Ht 5\' 8"  (1.727 m)   Wt 135 lb 12.8 oz (61.6 kg)   SpO2 98%   BMI 20.65 kg/m  , BMI Body mass index is 20.65 kg/m. GEN: Well nourished, well developed, in no acute distress  HEENT: normal  Neck: no JVD, carotid bruits, or masses Cardiac: RRR; 2/6 early systolic and 2/6 holodiastolic murmurs, no rubs, or gallops,no edema  Respiratory:  clear to auscultation bilaterally, normal work  of breathing GI: soft, nontender, nondistended, + BS MS: no deformity or atrophy  Skin: warm and dry, no rash Neuro:  Strength and sensation are intact Psych: euthymic mood, full affect   EKG:   The ekg ordered today demonstrates NSR, increased voltage, inferior T wave inversions   Recent Labs: 11/22/2016: ALT 15; BUN 18; Creatinine, Ser 0.93; Platelets 187; Potassium 4.7; Sodium 139; TSH 5.870   Lipid Panel    Component Value Date/Time   CHOL 195 08/25/2016 1437   TRIG 44 08/25/2016 1437   HDL 101 08/25/2016 1437   CHOLHDL 1.9 08/25/2016 1437   CHOLHDL 1.9 09/16/2015 1529   VLDL 9 09/16/2015 1529   LDLCALC 85 08/25/2016 1437     Other studies Reviewed: Additional studies/ records that were reviewed today with results demonstrating: TSH increased.   ASSESSMENT AND PLAN:  1. Abnormal ECG: Likely that we  will find an athletic heart on echo..   No cardiac sx at this time.  No restrictions on activity.  Echo would be best test to eval for LVH or chamber enlargement, suspected by ECG finding. 2. Murmur: Sounds like mild AI with some outflow murmur, possible aortic sclerosis.  Check echo. 3. Hypothyroid: TSH increased. Consider checking free T4.   Current medicines are reviewed at length with the patient today.  The patient concerns regarding his medicines were addressed.  The following changes have been made:  No change  Labs/ tests ordered today include: echo  Orders Placed This Encounter  Procedures  . ECHOCARDIOGRAM COMPLETE    Recommend 150 minutes/week of aerobic exercise Low fat, low carb, high fiber diet recommended  Disposition:   FU in prn   Signed, Larae Grooms, MD  12/23/2016 1:58 PM    [facility] [address] [address], [place], [place]  [address] Phone: [phone]; Fax: [phone]

## 2016-12-27 ENCOUNTER — Ambulatory Visit: Payer: 59 | Admitting: Interventional Cardiology

## 2017-01-04 ENCOUNTER — Emergency Department (HOSPITAL_COMMUNITY)
Admission: EM | Admit: 2017-01-04 | Discharge: 2017-01-04 | Disposition: A | Discharge status: Home / Self Care | Payer: 59 | Attending: Emergency Medicine | Admitting: Emergency Medicine

## 2017-01-04 ENCOUNTER — Encounter (HOSPITAL_COMMUNITY): Payer: Self-pay | Admitting: Emergency Medicine

## 2017-01-04 ENCOUNTER — Emergency Department (HOSPITAL_COMMUNITY): Payer: 59

## 2017-01-04 DIAGNOSIS — S0083XA Contusion of other part of head, initial encounter: Secondary | ICD-10-CM | POA: Insufficient documentation

## 2017-01-04 DIAGNOSIS — S22009A Unspecified fracture of unspecified thoracic vertebra, initial encounter for closed fracture: Secondary | ICD-10-CM

## 2017-01-04 DIAGNOSIS — R930 Abnormal findings on diagnostic imaging of skull and head, not elsewhere classified: Secondary | ICD-10-CM | POA: Diagnosis not present

## 2017-01-04 DIAGNOSIS — Y939 Activity, unspecified: Secondary | ICD-10-CM | POA: Diagnosis not present

## 2017-01-04 DIAGNOSIS — S0081XA Abrasion of other part of head, initial encounter: Secondary | ICD-10-CM | POA: Insufficient documentation

## 2017-01-04 DIAGNOSIS — S2241XA Multiple fractures of ribs, right side, initial encounter for closed fracture: Secondary | ICD-10-CM | POA: Insufficient documentation

## 2017-01-04 DIAGNOSIS — Y92410 Unspecified street and highway as the place of occurrence of the external cause: Secondary | ICD-10-CM | POA: Diagnosis not present

## 2017-01-04 DIAGNOSIS — S0993XA Unspecified injury of face, initial encounter: Secondary | ICD-10-CM | POA: Diagnosis not present

## 2017-01-04 DIAGNOSIS — I712 Thoracic aortic aneurysm, without rupture, unspecified: Secondary | ICD-10-CM

## 2017-01-04 DIAGNOSIS — S80211A Abrasion, right knee, initial encounter: Secondary | ICD-10-CM | POA: Insufficient documentation

## 2017-01-04 DIAGNOSIS — S22018A Other fracture of first thoracic vertebra, initial encounter for closed fracture: Secondary | ICD-10-CM | POA: Insufficient documentation

## 2017-01-04 DIAGNOSIS — M542 Cervicalgia: Secondary | ICD-10-CM | POA: Diagnosis not present

## 2017-01-04 DIAGNOSIS — Y999 Unspecified external cause status: Secondary | ICD-10-CM | POA: Diagnosis not present

## 2017-01-04 DIAGNOSIS — S299XXA Unspecified injury of thorax, initial encounter: Secondary | ICD-10-CM | POA: Diagnosis present

## 2017-01-04 DIAGNOSIS — S0990XA Unspecified injury of head, initial encounter: Secondary | ICD-10-CM | POA: Diagnosis not present

## 2017-01-04 DIAGNOSIS — R51 Headache: Secondary | ICD-10-CM | POA: Diagnosis not present

## 2017-01-04 DIAGNOSIS — S199XXA Unspecified injury of neck, initial encounter: Secondary | ICD-10-CM | POA: Diagnosis not present

## 2017-01-04 DIAGNOSIS — S27321A Contusion of lung, unilateral, initial encounter: Secondary | ICD-10-CM | POA: Diagnosis not present

## 2017-01-04 HISTORY — DX: Disorder of thyroid, unspecified: E07.9

## 2017-01-04 LAB — CBC WITH DIFFERENTIAL/PLATELET
BASOS PCT: 0 %
Basophils Absolute: 0 10*3/uL (ref 0.0–0.1)
EOS ABS: 0.2 10*3/uL (ref 0.0–0.7)
Eosinophils Relative: 4 %
HEMATOCRIT: 42.6 % (ref 39.0–52.0)
HEMOGLOBIN: 14.3 g/dL (ref 13.0–17.0)
LYMPHS ABS: 1.5 10*3/uL (ref 0.7–4.0)
Lymphocytes Relative: 30 %
MCH: 32.7 pg (ref 26.0–34.0)
MCHC: 33.6 g/dL (ref 30.0–36.0)
MCV: 97.5 fL (ref 78.0–100.0)
MONO ABS: 0.3 10*3/uL (ref 0.1–1.0)
MONOS PCT: 7 %
NEUTROS ABS: 2.9 10*3/uL (ref 1.7–7.7)
Neutrophils Relative %: 59 %
Platelets: 157 10*3/uL (ref 150–400)
RBC: 4.37 MIL/uL (ref 4.22–5.81)
RDW: 13.2 % (ref 11.5–15.5)
WBC: 4.9 10*3/uL (ref 4.0–10.5)

## 2017-01-04 LAB — COMPREHENSIVE METABOLIC PANEL
ALBUMIN: 3.8 g/dL (ref 3.5–5.0)
ALK PHOS: 44 U/L (ref 38–126)
ALT: 56 U/L (ref 17–63)
AST: 81 U/L — ABNORMAL HIGH (ref 15–41)
Anion gap: 10 (ref 5–15)
BUN: 18 mg/dL (ref 6–20)
CHLORIDE: 104 mmol/L (ref 101–111)
CO2: 24 mmol/L (ref 22–32)
Calcium: 8.8 mg/dL — ABNORMAL LOW (ref 8.9–10.3)
Creatinine, Ser: 1.07 mg/dL (ref 0.61–1.24)
GFR calc Af Amer: >60 mL/min (ref >60)
GFR calc non Af Amer: >60 mL/min (ref >60)
GLUCOSE: 135 mg/dL — AB (ref 65–99)
Potassium: 4.3 mmol/L (ref 3.5–5.1)
SODIUM: 138 mmol/L (ref 135–145)
Total Bilirubin: 0.9 mg/dL (ref 0.3–1.2)
Total Protein: 5.9 g/dL — ABNORMAL LOW (ref 6.5–8.1)

## 2017-01-04 MED ORDER — BACITRACIN ZINC 500 UNIT/GM EX OINT
1.0000 "application " | TOPICAL_OINTMENT | Freq: Two times a day (BID) | CUTANEOUS | Status: DC
  Administered 2017-01-04: 1 via TOPICAL
  Filled 2017-01-04: qty 1.8

## 2017-01-04 MED ORDER — MORPHINE SULFATE (PF) 4 MG/ML IV SOLN
4.0000 mg | Freq: Once | INTRAVENOUS | Status: AC
  Administered 2017-01-04: 4 mg via INTRAVENOUS
  Filled 2017-01-04: qty 1

## 2017-01-04 MED ORDER — IOPAMIDOL (ISOVUE-370) INJECTION 76%
INTRAVENOUS | Status: AC
  Administered 2017-01-04: 100 mL
  Filled 2017-01-04: qty 100

## 2017-01-04 MED ORDER — HYDROCODONE-ACETAMINOPHEN 5-325 MG PO TABS
2.0000 | ORAL_TABLET | Freq: Once | ORAL | Status: AC
  Administered 2017-01-04: 2 via ORAL
  Filled 2017-01-04: qty 2

## 2017-01-04 MED ORDER — OXYCODONE-ACETAMINOPHEN 5-325 MG PO TABS: 1.0000 | ORAL_TABLET | Freq: Four times a day (QID) | ORAL | 0 refills | Status: DC | PRN

## 2017-01-04 NOTE — ED Notes (Signed)
Pt to CT scan for CT chest

## 2017-01-04 NOTE — ED Notes (Signed)
Aspen collar applied by this RN while maintaining stabilization of c-spine.  Pt a/o x 4 at discharge.  Pt was ambulatory with steady gait but was escorted out via wheelchair.

## 2017-01-04 NOTE — ED Triage Notes (Signed)
See trauma chart

## 2017-01-04 NOTE — Discharge Instructions (Signed)
Use incentive spirometer multiple times daily. Take ibuprofen on a scheduled for the next 3-4 days and use Percocet as needed for breakthrough pain. Return immediately if you have any numbness or weakness of extremities, breathing problems, fever, abdominal pain, vomiting, or confusion.

## 2017-01-04 NOTE — ED Notes (Signed)
Family at beside. Family given emotional support. 
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
## 2017-01-04 NOTE — Progress Notes (Signed)
Orthopedic Tech Progress Note Patient Details:  James Mayo 10-Sep-1951 [id number]  Patient ID: James Mayo, male   DOB: 1950/10/19, 66 y.o.   MRN: [id number]   James Mayo 01/04/2017, 9:58 AM Made level 2 trauma visit

## 2017-01-04 NOTE — Progress Notes (Signed)
   Chaplain unable to reach Emergency Contact (spouse).  Met w/ patient at bedside.  Per patient, Emergency Contact informed of patient admission. Unclear whether or not EC is coming to hospital.   Will follow, as needed.   - Rev. [place] MDiv ThM

## 2017-01-04 NOTE — ED Provider Notes (Addendum)
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 reassuring vital signs. He was mildly bradycardic but reports that this is his baseline. He complained of mild right chest wall tenderness but no breathing problems and had no crepitus on exam. Abrasions as above. Obtained CT of head, C-spine, and face as well as chest x-ray. Gave Norco for pain as he reports his pain is very mild.  Chest x-ray showed multiple rib fractures therefore obtained CT angiogram of the chest which showed right posterior rib fractures 1 through 9 and incidental finding of thoracic aortic aneurysm. I did inform this patient of his aneurysm and need for outpatient follow-up for serial imaging. Because of the number of rib fractures, I did contact trauma surgery and patient was evaluated by Dr. Barry Dienes. Pain has been very well controlled here, 2500 on IS, and he wants to go home. She extensively discussed return precautions with him and the risks of multiple rib fractures and he has voiced understanding.  His head CT was negative for intercranial injury but he did have possible C7 fracture and T1 transverse process fracture on cervical CT. I discussed with neurosurgery, Dr. Ronnald Ramp, who recommended MRI. MRI shows  transverse process fracture but no C7 fracture and no evidence of impingement. Discussed w/ Dr. Ronnald Ramp, who recommended aspen collar and f/u in 2 weeks. Patient is completely asymptomatic with no neck pain and no upper ext numbness/weakness. I discussed supportive measures. Extensively reviewed return precautions regarding blunt trauma with the patient and his wife. They voiced understanding and patient was discharged in satisfactory condition.  Final Clinical Impressions(s) / ED Diagnoses   Final diagnoses:  Closed fracture of multiple ribs of right side, initial encounter  Abrasion of face, initial encounter  Abrasion of right knee, initial encounter  Closed fracture of transverse process of thoracic vertebra, initial encounter (HCC)    New Prescriptions New Prescriptions   OXYCODONE-ACETAMINOPHEN (PERCOCET) 5-325 MG TABLET    Take 1-2 tablets by mouth every 6 (six) hours as needed.     Sharlett Iles, MD 01/04/17 [place], MD 01/04/17 [phone]

## 2017-01-05 ENCOUNTER — Encounter: Payer: Self-pay | Admitting: Physician Assistant

## 2017-01-05 ENCOUNTER — Encounter: Payer: Self-pay | Admitting: Thoracic Surgery (Cardiothoracic Vascular Surgery)

## 2017-01-05 ENCOUNTER — Encounter: Payer: Self-pay | Admitting: Interventional Cardiology

## 2017-01-05 DIAGNOSIS — E039 Hypothyroidism, unspecified: Secondary | ICD-10-CM | POA: Insufficient documentation

## 2017-01-05 DIAGNOSIS — I712 Thoracic aortic aneurysm, without rupture, unspecified: Secondary | ICD-10-CM | POA: Insufficient documentation

## 2017-01-05 MED FILL — OXYCODONE W/APAP 5/325 TAB: 5-325 | 3 days supply | Qty: 18 | Fill #0

## 2017-01-09 ENCOUNTER — Encounter: Payer: Self-pay | Admitting: Sports Medicine

## 2017-01-09 MED ORDER — OXYCODONE-ACETAMINOPHEN 5-325 MG PO TABS: 1.0000 | ORAL_TABLET | Freq: Four times a day (QID) | ORAL | 0 refills | Status: DC | PRN

## 2017-01-09 MED FILL — OXYCODONE W/APAP 5/325 TAB: 5-325 | 2 days supply | Qty: 10 | Fill #0

## 2017-01-10 ENCOUNTER — Encounter: Payer: Self-pay | Admitting: Sports Medicine

## 2017-01-10 ENCOUNTER — Ambulatory Visit (INDEPENDENT_AMBULATORY_CARE_PROVIDER_SITE_OTHER): Payer: 59 | Admitting: Sports Medicine

## 2017-01-10 ENCOUNTER — Other Ambulatory Visit (HOSPITAL_COMMUNITY): Payer: 59

## 2017-01-10 VITALS — BP 167/78 | Ht 68.0 in | Wt 135.0 lb

## 2017-01-10 DIAGNOSIS — S2241XA Multiple fractures of ribs, right side, initial encounter for closed fracture: Secondary | ICD-10-CM | POA: Diagnosis not present

## 2017-01-10 DIAGNOSIS — I712 Thoracic aortic aneurysm, without rupture, unspecified: Secondary | ICD-10-CM

## 2017-01-10 DIAGNOSIS — S2239XA Fracture of one rib, unspecified side, initial encounter for closed fracture: Secondary | ICD-10-CM | POA: Insufficient documentation

## 2017-01-10 DIAGNOSIS — S2249XA Multiple fractures of ribs, unspecified side, initial encounter for closed fracture: Secondary | ICD-10-CM | POA: Insufficient documentation

## 2017-01-10 MED ORDER — OXYCODONE-ACETAMINOPHEN 5-325 MG PO TABS: 1.0000 | ORAL_TABLET | Freq: Four times a day (QID) | ORAL | 0 refills | Status: DC | PRN

## 2017-01-10 MED ORDER — PROMETHAZINE HCL 25 MG PO TABS: 25.0000 mg | ORAL_TABLET | Freq: Four times a day (QID) | ORAL | 0 refills | Status: DC | PRN

## 2017-01-10 MED FILL — OXYCODONE W/APAP 5/325 TAB: 5-325 | 5 days supply | Qty: 40 | Fill #0

## 2017-01-10 NOTE — Patient Instructions (Signed)
Rest!  You will get a call from the cardiothoracic surgeon concerning follow up on the aneurysm..  Continue the Norco and Ibuprofen.  Follow up as needed.
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
## 2017-01-12 ENCOUNTER — Encounter: Payer: Self-pay | Admitting: Cardiothoracic Surgery

## 2017-01-12 ENCOUNTER — Institutional Professional Consult (permissible substitution) (INDEPENDENT_AMBULATORY_CARE_PROVIDER_SITE_OTHER): Payer: 59 | Admitting: Cardiothoracic Surgery

## 2017-01-12 VITALS — BP 170/90 | HR 53 | Resp 16 | Ht 68.0 in | Wt 137.0 lb

## 2017-01-12 DIAGNOSIS — I712 Thoracic aortic aneurysm, without rupture, unspecified: Secondary | ICD-10-CM
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
## 2017-01-12 NOTE — Patient Instructions (Signed)
It's best to avoid activities that cause grunting or straining (medically referred to as a "valsalva maneuver"). This happens when a person bears down against a closed throat to increase the strength of arm or abdominal muscles. There's often a tendency to do this when lifting heavy weights, doing sit-ups, push-ups or chin-ups, etc., but it may be harmful.      Thoracic Aortic Aneurysm An aneurysm is a bulge in an artery. It happens when blood pushes up against a weakened or damaged artery wall. A thoracic aortic aneurysm is an aneurysm that occurs in the first part of the aorta, between the heart and the diaphragm. The aorta is the main artery of the body. It supplies blood from the heart to the rest of the body. Some aneurysms may not cause symptoms or problems. However, the major concern with a thoracic aortic aneurysm is that it can enlarge and burst (rupture), or blood can flow between the layers of the wall of the aorta through a tear (aorticdissection). Both of these conditions can cause bleeding inside the body and can be life-threatening if they are not diagnosed and treated right away. What are the causes? The exact cause of this condition is not known. What increases the risk? The following factors may make you more likely to develop this condition:  Being age 75 or older.  Having a hardening of the arteries caused by the buildup of fat and other substances in the lining of a blood vessel (arteriosclerosis).  Having inflammation of the walls of an artery (arteritis).  Having a genetic disease that weakens the body's connective tissue, such as Marfan syndrome.  Having an injury or trauma to the aorta.  Having an infection that is caused by bacteria, such as syphilis or staphylococcus, in the wall of the aorta (infectious aortitis).  Having high blood pressure (hypertension).  Being male.  Being white (Caucasian).  Having high cholesterol.  Having a family history of  aneurysms.  Using tobacco.  Having chronic obstructive pulmonary disease (COPD). What are the signs or symptoms? Symptoms of this condition vary depending on the size and rate of growth of the aneurysm. Most grow slowly and do not cause any symptoms. When symptoms do occur, they may include:  Pain in the chest, back, sides, or abdomen. The pain may vary in intensity. A sudden onset of severe pain may indicate that the aneurysm has ruptured.  Hoarseness.  Cough.  Shortness of breath.  Swallowing problems.  Swelling in the face, arms, or legs.  Fever.  Unexplained weight loss. How is this diagnosed? This condition may be diagnosed with:  An ultrasound.  X-rays.  A CT scan.  An MRI.  Tests to check the arteries for damage or blockages (angiogram). Most unruptured thoracic aortic aneurysms cause no symptoms, so they are often found during exams for other conditions. How is this treated? Treatment for this condition depends on:  The size of the aneurysm.  How fast the aneurysm is growing.  Your age.  Risk factors for rupture. Aneurysms that are smaller than 2.2 inches (5.5 cm) may be managed by using medicines to control blood pressure, manage pain, or fight infection. You may need regular monitoring to see if the aneurysm is getting bigger. Your health care provider may recommend that you have an ultrasound every year or every 6 months. How often you need to have an ultrasound depends on the size of the aneurysm, how fast it is growing, and whether you have a family history  of aneurysms. Surgical repair may be needed if your aneurysm is larger than 2.2 inches or if it is growing quickly. Follow these instructions at home: Eating and drinking   Eat a healthy diet. Your health care provider may recommend that you:  Lower your salt (sodium) intake. In some people, too much salt can raise blood pressure and increase the risk of thoracic aortic aneurysm.  Avoid foods  that are high in saturated fat and cholesterol, such as red meat and dairy.  Eat a diet that is low in sugar.  Increase your fiber intake by including whole grains, vegetables, and fruits in your diet. Eating these foods may help to lower blood pressure.  Limit or avoid alcohol as recommended by your health care provider. Lifestyle   Follow instructions from your health care provider about healthy lifestyle habits. Your health care provider may recommend that you:  Do not use any products that contain nicotine or tobacco, such as cigarettes and e-cigarettes. If you need help quitting, ask your health care provider.  Keep your blood pressure within normal limits. The target limit for most people is below 120/80. Check your blood pressure regularly. If it is high, ask your health care provider about ways that you can control it.  Keep your blood sugar (glucose) level and cholesterol levels within normal limits. Target limits for most people are:  Blood glucose level: Less than 100 mg/dL.  Total cholesterol level: Less than 200 mg/dL.  Maintain a healthy weight. Activity   Stay physically active and exercise regularly. Talk with your health care provider about how often you should exercise and ask which types of exercise are safe for you.  Avoid heavy lifting and activities that take a lot of effort (are strenuous). Ask your health care provider what activities are safe for you. General instructions   Keep all follow-up visits as told by your health care provider. This is important.  Talk with your health care provider about regular screenings to see if the aneurysm is getting bigger.  Take over-the-counter and prescription medicines only as told by your health care provider. Contact a health care provider if:  You have discomfort in your upper back, neck, or abdomen.  You have trouble swallowing.  You have a cough or hoarseness.  You have a family history of aneurysms.  You  have unexplained weight loss. Get help right away if:  You have sudden, severe pain in your upper back and abdomen. This pain may move into your chest and arms.  You have shortness of breath.  You have a fever. This information is not intended to replace advice given to you by your health care provider. Make sure you discuss any questions you have with your health care provider. Document Released: 08/29/2005 Document Revised: 06/10/2016 Document Reviewed: 06/10/2016 Elsevier Interactive Patient Education  2017 [employer] [place] [employer].  Aortic Dissection An aortic dissection happens when there is a tear in the main blood vessel of the body (aorta). The aorta comes out of the heart, curves around, and then goes down the chest (thoracic aorta) and into the abdomen (abdominal aorta) to supply arteries with blood. The wall of the aorta has inner and outer layers. Aortic dissection occurs most often in the thoracic aorta. As the tear widens and blood flows through it, the aorta becomes "double-barreled." This means that one part of the aorta continues to carry blood to the body, but blood also flows into the tear, between the layers of the aorta. The torn  part of the aorta fills with blood and swells up. This can reduce blood flow through the part of the aorta that is still supplying blood to the body. Aortic dissection is a medical emergency. What are the causes? An aortic dissection is commonly caused by weakening of the artery wall due to high blood pressure. Other causes may include:  An injury, such as from a car crash.  Birth defects that affect the heart (congenital heart defects).  Thickening of the artery walls. In some cases, the cause is not known. What increases the risk? The following factors may make you more likely to develop this condition:  Having certain medical conditions, such as:  High blood pressure (hypertension).  Hardening and narrowing of the arteries (atherosclerosis).  A  genetic disorder that affects the connective tissue, such as Marfan syndrome or Ehlers-Danlos syndrome.  A condition that causes inflammation of blood vessels, such as giant cell arteritis.  Having a chest injury.  Having surgery on the aorta.  Being born with a congenital heart defect.  Being male.  Being older than age 47.  Using cocaine.  Smoking.  Lifting heavy weights or doing other types of high-intensity resistance training. What are the signs or symptoms? Signs and symptoms of aortic dissection start suddenly. The most common symptoms are:  Severe chest pain that may feel like tearing, stabbing, or sharp pain.  Severe pain that spreads (radiates) to the back, neck, jaw, or abdomen. Other symptoms may include:  Trouble breathing.  Dizziness or fainting.  Sudden weakness on one side of the body.  Nausea or vomiting.  Trouble swallowing.  Coughing up blood.  Vomiting blood.  Clammy skin. How is this diagnosed? This condition may be diagnosed based on:  Your symptoms.  A physical exam. This may include:  Listening for abnormal blood flow sounds (murmurs) in your chest or abdomen.  Checking your pulse in your arms and legs.  Checking your blood pressure to see whether it is low or whether there is a difference between the measurements in your right and left arm.  Electrocardiogram (ECG). This test measures the electrical activity in your heart.  Chest X-ray.  CT scan.  MRI.  Aortic angiogram. This test involves injecting dye to make it easier to see your blood vessels clearly.  Echocardiogram to study your heart using sound waves.  Blood tests. How is this treated? It is important to treat an aortic dissection as quickly as possible. Treatment may start as soon as your health care provider thinks that you have aortic dissection. Treatment depends on the location and severity of your dissection and your overall health. Treatment may  include:  Medicines to lower your blood pressure.  Surgery to repair the dissected part of your aorta with artificial material (syntheticgraft).  A medical procedure to insert a stent-graft into the aorta (endovascular procedure). During this procedure, a long, thin tube (stent) is inserted into an artery near the groin (femoral artery) and moved up to the damaged part of the aorta. Then, the stent is opened to help improve blood flow and prevent future dissection. Follow these instructions at home: Activity   Avoid activities that could injure your chest or your abdomen. Ask your health care provider what activities are safe for you.  After you have recovered, try to stay active. Ask your health care provider what activities are safe for you after recovery.  Do not lift anything that is heavier than 10 lb (4.5 kg) until your health care  provider approves.  Do not drive or use heavy machinery while taking prescription pain medicine. Eating and drinking   Eat a heart-healthy diet, which includes lots of fresh fruits and vegetables, low-fat (lean) protein, and whole grains.  Check ingredients and nutrition facts on packaged foods and beverages, and avoid foods with high amounts of:  Salt (sodium).  Saturated fats (like red meat).  Trans fats (like fried food). General instructions   Take over-the-counter and prescription medicines only as told by your health care provider.  Work with your health care provider to manage your blood pressure.  Talk with your health care provider about how to manage stress.  Do not use any products that contain nicotine or tobacco, such as cigarettes and e-cigarettes. If you need help quitting, ask your health care provider.  Keep all follow-up visits as told by your health care provider. This is important. Get help right away if:  You develop any symptoms of aortic dissection after treatment, including severe pain in your chest, back, or  abdomen.  You have a pain in your abdomen.  You have trouble breathing or you develop a cough.  You faint.  You develop a racing heartbeat. These symptoms may represent a serious problem that is an emergency. Do not wait to see if the symptoms will go away. Get medical help right away. Call your local emergency services (911 in the [place] Do not drive yourself to the hospital. Summary  An aortic dissection happens when there is a tear in the main blood vessel of the body (aorta). It is a medical emergency.  The most common symptom is severe pain in the chest that spreads (radiates) to the back, neck, jaw, or abdomen.  It is important to treat an aortic dissection as quickly as possible. Treatment typically includes surgery and medicines. This information is not intended to replace advice given to you by your health care provider. Make sure you discuss any questions you have with your health care provider. Document Released: 12/06/2007 Document Revised: 07/18/2016 Document Reviewed: 07/18/2016 Elsevier Interactive Patient Education  2017 [employer].

## 2017-01-13 ENCOUNTER — Other Ambulatory Visit: Payer: Self-pay | Admitting: *Deleted

## 2017-01-13 ENCOUNTER — Telehealth: Payer: Self-pay | Admitting: Interventional Cardiology

## 2017-01-13 DIAGNOSIS — I712 Thoracic aortic aneurysm, without rupture, unspecified: Secondary | ICD-10-CM

## 2017-01-13 NOTE — Telephone Encounter (Signed)
New Message     Pt wants to know if he should change appt for Dr Irish Lack due to not having echo done ?

## 2017-01-13 NOTE — Telephone Encounter (Signed)
Patient wanting to know if he needs to wait to have his OV with Dr. Irish Lack on 01/16/17 rescheduled until after the echocardiogram is done. Patient made aware that Dr. Servando Snare wanted him to have an echocardiogram done and to see Dr. Irish Lack for a reevaluation to see if he needed a heart cath. Patient requesting to reschedule appointment until after he has his echo, so that he can discuss the echo and possible catheterization all at the same time. Patient made aware that it could delay his surgery. Patient would still like to reschedule so that he can discuss everything together. Appointment rescheduled for 01/18/17 at 11:40 AM (first available after echo). Patient verbalized understanding.

## 2017-01-16 ENCOUNTER — Ambulatory Visit (HOSPITAL_COMMUNITY)
Admission: RE | Admit: 2017-01-16 | Discharge: 2017-01-16 | Disposition: A | Discharge status: Home / Self Care | Payer: 59 | Source: Ambulatory Visit | Attending: Cardiothoracic Surgery | Admitting: Cardiothoracic Surgery

## 2017-01-16 ENCOUNTER — Encounter (HOSPITAL_COMMUNITY): Payer: Self-pay

## 2017-01-16 ENCOUNTER — Ambulatory Visit: Payer: 59 | Admitting: Interventional Cardiology

## 2017-01-16 ENCOUNTER — Other Ambulatory Visit: Payer: Self-pay

## 2017-01-16 ENCOUNTER — Ambulatory Visit (HOSPITAL_BASED_OUTPATIENT_CLINIC_OR_DEPARTMENT_OTHER): Payer: 59

## 2017-01-16 DIAGNOSIS — I712 Thoracic aortic aneurysm, without rupture, unspecified: Secondary | ICD-10-CM

## 2017-01-16 DIAGNOSIS — I2510 Atherosclerotic heart disease of native coronary artery without angina pectoris: Secondary | ICD-10-CM | POA: Diagnosis not present

## 2017-01-16 DIAGNOSIS — I253 Aneurysm of heart: Secondary | ICD-10-CM | POA: Diagnosis not present

## 2017-01-16 DIAGNOSIS — R011 Cardiac murmur, unspecified: Secondary | ICD-10-CM | POA: Diagnosis not present

## 2017-01-16 DIAGNOSIS — I352 Nonrheumatic aortic (valve) stenosis with insufficiency: Secondary | ICD-10-CM | POA: Diagnosis not present

## 2017-01-16 DIAGNOSIS — Z01818 Encounter for other preprocedural examination: Secondary | ICD-10-CM | POA: Diagnosis not present

## 2017-01-16 LAB — ECHOCARDIOGRAM COMPLETE

## 2017-01-16 MED ORDER — METOPROLOL TARTRATE 50 MG PO TABS
ORAL_TABLET | ORAL | Status: AC
  Filled 2017-01-16: qty 1

## 2017-01-16 MED ORDER — IOPAMIDOL (ISOVUE-370) INJECTION 76%
INTRAVENOUS | Status: AC
  Administered 2017-01-16: 80 mL
  Filled 2017-01-16: qty 100

## 2017-01-16 MED ORDER — NITROGLYCERIN 0.4 MG SL SUBL
0.4000 mg | SUBLINGUAL_TABLET | SUBLINGUAL | Status: DC | PRN
  Administered 2017-01-16: 0.4 mg via SUBLINGUAL

## 2017-01-16 MED ORDER — NITROGLYCERIN 0.4 MG SL SUBL
SUBLINGUAL_TABLET | SUBLINGUAL | Status: AC
  Filled 2017-01-16: qty 1

## 2017-01-16 MED ORDER — METOPROLOL TARTRATE 50 MG PO TABS
50.0000 mg | ORAL_TABLET | Freq: Once | ORAL | Status: AC
  Administered 2017-01-16: 50 mg via ORAL

## 2017-01-16 NOTE — Progress Notes (Signed)
CT scan completed. Tolerated well. D/C home walking with wife. Awake and alert. In no distress. 

## 2017-01-17 ENCOUNTER — Encounter: Payer: 59 | Admitting: Thoracic Surgery (Cardiothoracic Vascular Surgery)

## 2017-01-17 ENCOUNTER — Encounter: Payer: Self-pay | Admitting: Interventional Cardiology

## 2017-01-18 ENCOUNTER — Encounter: Payer: Self-pay | Admitting: Cardiothoracic Surgery

## 2017-01-18 ENCOUNTER — Encounter: Payer: Self-pay | Admitting: Interventional Cardiology

## 2017-01-18 ENCOUNTER — Ambulatory Visit (INDEPENDENT_AMBULATORY_CARE_PROVIDER_SITE_OTHER): Payer: 59 | Admitting: Cardiothoracic Surgery

## 2017-01-18 ENCOUNTER — Ambulatory Visit (INDEPENDENT_AMBULATORY_CARE_PROVIDER_SITE_OTHER): Payer: 59 | Admitting: Interventional Cardiology

## 2017-01-18 VITALS — BP 170/98 | HR 48 | Ht 68.0 in | Wt 135.0 lb

## 2017-01-18 VITALS — BP 162/90 | HR 62 | Resp 20 | Ht 68.0 in | Wt 135.0 lb

## 2017-01-18 DIAGNOSIS — I712 Thoracic aortic aneurysm, without rupture, unspecified: Secondary | ICD-10-CM

## 2017-01-18 DIAGNOSIS — I351 Nonrheumatic aortic (valve) insufficiency: Secondary | ICD-10-CM | POA: Diagnosis not present

## 2017-01-18 DIAGNOSIS — I350 Nonrheumatic aortic (valve) stenosis: Secondary | ICD-10-CM | POA: Diagnosis not present
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
## 2017-01-18 NOTE — Patient Instructions (Signed)
Medication Instructions:  Your physician recommends that you continue on your current medications as directed. Please refer to the Current Medication list given to you today.   Labwork: CBC, BMET, PT/INR  Testing/Procedures: Your physician has requested that you have a cardiac catheterization. Cardiac catheterization is used to diagnose and/or treat various heart conditions. Doctors may recommend this procedure for a number of different reasons. The most common reason is to evaluate chest pain. Chest pain can be a symptom of coronary artery disease (CAD), and cardiac catheterization can show whether plaque is narrowing or blocking your heart's arteries. This procedure is also used to evaluate the valves, as well as measure the blood flow and oxygen levels in different parts of your heart. For further information please visit [email or web address]. Please follow instruction sheet, as given.     [facility] [address], [place] [address] [place] [address] Dept: [phone] Loc: [place]  01/18/2017  You are scheduled for a Cardiac Catheterization on Wednesday, May 16 with Dr. Shelva Majestic.  1. Please arrive at the [facility] (Main Entrance A) at [facility]: [address] [place], [place] [address] at 11:00 AM (two hours before your procedure to ensure your preparation). Free valet parking service is available.   Special note: Every effort is made to have your procedure done on time. Please understand that emergencies sometimes delay scheduled procedures.  2. Diet: Do not eat or drink anything after midnight prior to your procedure except sips of water to take medications.  3. Labs: Labs drawn today.  4. Medication instructions in preparation for your procedure:    On the morning of your procedure, take your morning medicines NOT listed above.  You may use sips of  water.  5. Plan for one night stay--bring personal belongings. 6. Bring a current list of your medications and current insurance cards. 7. You MUST have a responsible person to drive you home. 8. Someone MUST be with you the first 24 hours after you arrive home or your discharge will be delayed. 9. Please wear clothes that are easy to get on and off and wear slip-on shoes.  Thank you for allowing [place] to care for you!   -- [facility]    Follow-Up: Follow up with Dr Irish Lack after cath and surgery as needed.  Any Other Special Instructions Will Be Listed Below (If Applicable).  Coronary Angiogram A coronary angiogram is an X-ray procedure that is used to examine the arteries in the heart. In this procedure, a dye (contrast dye) is injected through a long, thin tube (catheter). The catheter is inserted through the groin, wrist, or arm. The dye is injected into each artery, then X-rays are taken to show if there is a blockage in the arteries of the heart. This procedure can also show if you have valve disease or a disease of the aorta, and it can be used to check the overall function of your heart muscle. You may have a coronary angiogram if:  You are having chest pain, or other symptoms of angina, and you are at risk for heart disease.  You have an abnormal electrocardiogram (ECG) or stress test.  You have chest pain and heart failure.  You are having irregular heart rhythms.  You and your health care provider determine that the benefits of the test information outweigh the risks of the procedure. Let your health care provider  know about:  Any allergies you have, including allergies to contrast dye.  All medicines you are taking, including vitamins, herbs, eye drops, creams, and over-the-counter medicines.  Any problems you or family members have had with anesthetic medicines.  Any blood disorders you have.  Any surgeries you have had.  History  of kidney problems or kidney failure.  Any medical conditions you have.  Whether you are pregnant or may be pregnant. What are the risks? Generally, this is a safe procedure. However, problems may occur, including:  Infection.  Allergic reaction to medicines or dyes that are used.  Bleeding from the access site or other locations.  Kidney injury, especially in people with impaired kidney function.  Stroke (rare).  Heart attack (rare).  Damage to other structures or organs. What happens before the procedure? Staying hydrated  Follow instructions from your health care provider about hydration, which may include:  Up to 2 hours before the procedure - you may continue to drink clear liquids, such as water, clear fruit juice, black coffee, and plain tea. Eating and drinking restrictions  Follow instructions from your health care provider about eating and drinking, which may include:  8 hours before the procedure - stop eating heavy meals or foods such as meat, fried foods, or fatty foods.  6 hours before the procedure - stop eating light meals or foods, such as toast or cereal.  2 hours before the procedure - stop drinking clear liquids. General instructions   Ask your health care provider about:  Changing or stopping your regular medicines. This is especially important if you are taking diabetes medicines or blood thinners.  Taking medicines such as ibuprofen. These medicines can thin your blood. Do not take these medicines before your procedure if your health care provider instructs you not to, though aspirin may be recommended prior to coronary angiograms.  Plan to have someone take you home from the hospital or clinic.  You may need to have blood tests or X-rays done. What happens during the procedure?  An IV tube will be inserted into one of your veins.  You will be given one or more of the following:  A medicine to help you relax (sedative).  A medicine to numb  the area where the catheter will be inserted into an artery (local anesthetic).  To reduce your risk of infection:  Your health care team will wash or sanitize their hands.  Your skin will be washed with soap.  Hair may be removed from the area where the catheter will be inserted.  You will be connected to a continuous ECG monitor.  The catheter will be inserted into an artery. The location may be in your groin, in your wrist, or in the fold of your arm (near your elbow).  A type of X-ray (fluoroscopy) will be used to help guide the catheter to the opening of the blood vessel that is being examined.  A dye will be injected into the catheter, and X-rays will be taken. The dye will help to show where any narrowing or blockages are located in the heart arteries.  Tell your health care provider if you have any chest pain or trouble breathing during the procedure.  If blockages are found, your health care provider may perform another procedure, such as inserting a coronary stent. The procedure may vary among health care providers and hospitals. What happens after the procedure?  After the procedure, you will need to keep the area still for a few  hours, or for as long as told by your health care provider. If the procedure is done through the groin, you will be instructed to not bend and not cross your legs.  The insertion site will be checked frequently.  The pulse in your foot or wrist will be checked frequently.  You may have additional blood tests, X-rays, and a test that records the electrical activity of your heart (ECG).  Do not drive for 24 hours if you were given a sedative. Summary  A coronary angiogram is an X-ray procedure that is used to look into the arteries in the heart.  During the procedure, a dye (contrast dye) is injected through a long, thin tube (catheter). The catheter is inserted through the groin, wrist, or arm.  Tell your health care provider about any  allergies you have, including allergies to contrast dye.  After the procedure, you will need to keep the area still for a few hours, or for as long as told by your health care provider. This information is not intended to replace advice given to you by your health care provider. Make sure you discuss any questions you have with your health care provider. Document Released: 03/05/2003 Document Revised: 06/10/2016 Document Reviewed: 06/10/2016 Elsevier Interactive Patient Education  2017 [employer].     If you need a refill on your cardiac medications before your next appointment, please call your pharmacy.

## 2017-01-19 LAB — CBC
HEMATOCRIT: 43 % (ref 37.5–51.0)
Hemoglobin: 14.3 g/dL (ref 13.0–17.7)
MCH: 32.1 pg (ref 26.6–33.0)
MCHC: 33.3 g/dL (ref 31.5–35.7)
MCV: 96 fL (ref 79–97)
Platelets: 330 10*3/uL (ref 150–379)
RBC: 4.46 x10E6/uL (ref 4.14–5.80)
RDW: 14.5 % (ref 12.3–15.4)
WBC: 6.8 10*3/uL (ref 3.4–10.8)

## 2017-01-19 LAB — PROTIME-INR
INR: 1 (ref 0.8–1.2)
PROTHROMBIN TIME: 10.4 s (ref 9.1–12.0)

## 2017-01-19 LAB — BASIC METABOLIC PANEL
BUN / CREAT RATIO: 16 (ref 10–24)
BUN: 16 mg/dL (ref 8–27)
CALCIUM: 9.1 mg/dL (ref 8.6–10.2)
CHLORIDE: 100 mmol/L (ref 96–106)
CO2: 26 mmol/L (ref 18–29)
Creatinine, Ser: 0.98 mg/dL (ref 0.76–1.27)
GFR calc Af Amer: 92 mL/min/{1.73_m2} (ref >59)
GFR calc non Af Amer: 80 mL/min/{1.73_m2} (ref >59)
GLUCOSE: 97 mg/dL (ref 65–99)
Potassium: 5 mmol/L (ref 3.5–5.2)
Sodium: 141 mmol/L (ref 134–144)

## 2017-01-20 ENCOUNTER — Other Ambulatory Visit (HOSPITAL_COMMUNITY): Payer: 59

## 2017-01-20 ENCOUNTER — Telehealth: Payer: Self-pay | Admitting: Interventional Cardiology

## 2017-01-20 NOTE — Telephone Encounter (Signed)
Returned call to wife (ok per DPR)-questions answered in regards to upcoming heart cath.  Wife aware and verbalized understanding.  Advised to call with further questions or concerns.

## 2017-01-20 NOTE — Telephone Encounter (Signed)
New message      Does someone need to stay with her husband after the Cath ?  Is it necessary for her to be at the hospital while pt is in recovery ?  How long can she be with him pre-procedure?   Can her husband bring his phone and a book with him while he is waiting pre procedure and after?

## 2017-01-23 MED FILL — LEVOTHYROXINE 75 MCG TABLET: 75 | 90 days supply | Qty: 90 | Fill #1

## 2017-01-24 ENCOUNTER — Encounter: Payer: Self-pay | Admitting: Sports Medicine

## 2017-01-24 ENCOUNTER — Telehealth: Payer: Self-pay | Admitting: Cardiovascular Disease

## 2017-01-24 ENCOUNTER — Ambulatory Visit (INDEPENDENT_AMBULATORY_CARE_PROVIDER_SITE_OTHER): Payer: 59 | Admitting: Sports Medicine

## 2017-01-24 DIAGNOSIS — I712 Thoracic aortic aneurysm, without rupture, unspecified: Secondary | ICD-10-CM

## 2017-01-24 DIAGNOSIS — S2241XD Multiple fractures of ribs, right side, subsequent encounter for fracture with routine healing: Secondary | ICD-10-CM

## 2017-01-24 MED ORDER — OXYCODONE-ACETAMINOPHEN 5-325 MG PO TABS: 1.0000 | ORAL_TABLET | Freq: Four times a day (QID) | ORAL | 0 refills | Status: DC | PRN

## 2017-01-24 MED FILL — OXYCODONE W/APAP 5/325 TAB: 5-325 | 4 days supply | Qty: 28 | Fill #0

## 2017-01-24 NOTE — Assessment & Plan Note (Signed)
Under evaluation and plan is for surgery in June  Cardiac Cath tomorrow

## 2017-01-24 NOTE — Assessment & Plan Note (Signed)
These are healing on schedule Cont. Vicodin primarily at night  I suggested biking outside was high risk at present Stationary bike OK to swim easy  Walking  Deep breathing

## 2017-01-24 NOTE — Telephone Encounter (Signed)
Patient wife calling, states that patient is scheduled for cath tomorrow and that she received two conflicting sets of instructions. Mrs. Latterell would like to know if patient can have a "small breakfast and a cup of coffee" and if he can, what time should he eat? Please call, thanks.

## 2017-01-24 NOTE — Telephone Encounter (Signed)
Lm2cb-per cath letter:  Diet: Do not eat or drink anything after midnight prior to your procedure except sips of water to take medications.
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
## 2017-01-25 ENCOUNTER — Ambulatory Visit (HOSPITAL_COMMUNITY)
Admission: RE | Admit: 2017-01-25 | Discharge: 2017-01-25 | Disposition: A | Discharge status: Home / Self Care | Payer: 59 | Source: Ambulatory Visit | Attending: Cardiovascular Disease | Admitting: Cardiovascular Disease

## 2017-01-25 ENCOUNTER — Encounter (HOSPITAL_COMMUNITY)
Admission: RE | Disposition: A | Discharge status: Home / Self Care | Payer: Self-pay | Source: Ambulatory Visit | Attending: Cardiovascular Disease

## 2017-01-25 DIAGNOSIS — I2510 Atherosclerotic heart disease of native coronary artery without angina pectoris: Secondary | ICD-10-CM | POA: Diagnosis not present

## 2017-01-25 DIAGNOSIS — I712 Thoracic aortic aneurysm, without rupture: Secondary | ICD-10-CM | POA: Insufficient documentation

## 2017-01-25 DIAGNOSIS — R001 Bradycardia, unspecified: Secondary | ICD-10-CM | POA: Diagnosis not present

## 2017-01-25 DIAGNOSIS — E039 Hypothyroidism, unspecified: Secondary | ICD-10-CM | POA: Diagnosis not present

## 2017-01-25 DIAGNOSIS — I352 Nonrheumatic aortic (valve) stenosis with insufficiency: Secondary | ICD-10-CM | POA: Insufficient documentation

## 2017-01-25 DIAGNOSIS — I350 Nonrheumatic aortic (valve) stenosis: Secondary | ICD-10-CM

## 2017-01-25 HISTORY — PX: RIGHT/LEFT HEART CATH AND CORONARY ANGIOGRAPHY: CATH118266

## 2017-01-25 LAB — POCT I-STAT 3, VENOUS BLOOD GAS (G3P V)
ACID-BASE EXCESS: 2 mmol/L (ref 0.0–2.0)
Bicarbonate: 26.7 mmol/L (ref 20.0–28.0)
O2 Saturation: 75 %
PH VEN: 7.402 (ref 7.250–7.430)
TCO2: 28 mmol/L (ref 0–100)
pCO2, Ven: 42.9 mmHg — ABNORMAL LOW (ref 44.0–60.0)
pO2, Ven: 40 mmHg (ref 32.0–45.0)

## 2017-01-25 LAB — POCT I-STAT 3, ART BLOOD GAS (G3+)
ACID-BASE DEFICIT: 1 mmol/L (ref 0.0–2.0)
Bicarbonate: 22.8 mmol/L (ref 20.0–28.0)
O2 SAT: 98 %
PCO2 ART: 35.9 mmHg (ref 32.0–48.0)
PO2 ART: 100 mmHg (ref 83.0–108.0)
TCO2: 24 mmol/L (ref 0–100)
pH, Arterial: 7.41 (ref 7.350–7.450)

## 2017-01-25 SURGERY — RIGHT/LEFT HEART CATH AND CORONARY ANGIOGRAPHY
Anesthesia: LOCAL

## 2017-01-25 MED ORDER — SODIUM CHLORIDE 0.9 % IV SOLN: 250.0000 mL | INTRAVENOUS | Status: DC | PRN

## 2017-01-25 MED ORDER — MIDAZOLAM HCL 2 MG/2ML IJ SOLN
INTRAMUSCULAR | Status: AC
  Filled 2017-01-25: qty 2

## 2017-01-25 MED ORDER — ONDANSETRON HCL 4 MG/2ML IJ SOLN: 4.0000 mg | Freq: Four times a day (QID) | INTRAMUSCULAR | Status: DC | PRN

## 2017-01-25 MED ORDER — MIDAZOLAM HCL 2 MG/2ML IJ SOLN
INTRAMUSCULAR | Status: DC | PRN
  Administered 2017-01-25: 2 mg via INTRAVENOUS
  Administered 2017-01-25: 1 mg via INTRAVENOUS

## 2017-01-25 MED ORDER — SODIUM CHLORIDE 0.9 % WEIGHT BASED INFUSION: 1.0000 mL/kg/h | INTRAVENOUS | Status: DC

## 2017-01-25 MED ORDER — HEPARIN (PORCINE) IN NACL 2-0.9 UNIT/ML-% IJ SOLN
INTRAMUSCULAR | Status: AC | PRN
  Administered 2017-01-25: 1000 mL

## 2017-01-25 MED ORDER — VERAPAMIL HCL 2.5 MG/ML IV SOLN
INTRAVENOUS | Status: DC | PRN
  Administered 2017-01-25: 10 mL via INTRA_ARTERIAL

## 2017-01-25 MED ORDER — SODIUM CHLORIDE 0.9 % IV SOLN: INTRAVENOUS | Status: DC

## 2017-01-25 MED ORDER — LIDOCAINE HCL (PF) 1 % IJ SOLN
INTRAMUSCULAR | Status: AC
  Filled 2017-01-25: qty 30

## 2017-01-25 MED ORDER — LIDOCAINE HCL (PF) 1 % IJ SOLN
INTRAMUSCULAR | Status: DC | PRN
  Administered 2017-01-25 (×2): 2 mL

## 2017-01-25 MED ORDER — IOPAMIDOL (ISOVUE-370) INJECTION 76%
INTRAVENOUS | Status: AC
  Filled 2017-01-25: qty 100

## 2017-01-25 MED ORDER — FENTANYL CITRATE (PF) 100 MCG/2ML IJ SOLN
INTRAMUSCULAR | Status: DC | PRN
  Administered 2017-01-25 (×2): 25 ug via INTRAVENOUS

## 2017-01-25 MED ORDER — HEPARIN SODIUM (PORCINE) 1000 UNIT/ML IJ SOLN
INTRAMUSCULAR | Status: AC
  Filled 2017-01-25: qty 1

## 2017-01-25 MED ORDER — ACETAMINOPHEN 325 MG PO TABS: 650.0000 mg | ORAL_TABLET | ORAL | Status: DC | PRN

## 2017-01-25 MED ORDER — VERAPAMIL HCL 2.5 MG/ML IV SOLN
INTRAVENOUS | Status: AC
  Filled 2017-01-25: qty 2

## 2017-01-25 MED ORDER — SODIUM CHLORIDE 0.9% FLUSH: 3.0000 mL | INTRAVENOUS | Status: DC | PRN

## 2017-01-25 MED ORDER — SODIUM CHLORIDE 0.9% FLUSH: 3.0000 mL | Freq: Two times a day (BID) | INTRAVENOUS | Status: DC

## 2017-01-25 MED ORDER — HEPARIN SODIUM (PORCINE) 1000 UNIT/ML IJ SOLN
INTRAMUSCULAR | Status: DC | PRN
  Administered 2017-01-25: 3500 [IU] via INTRAVENOUS

## 2017-01-25 MED ORDER — FENTANYL CITRATE (PF) 100 MCG/2ML IJ SOLN
INTRAMUSCULAR | Status: AC
  Filled 2017-01-25: qty 2

## 2017-01-25 MED ORDER — SODIUM CHLORIDE 0.9 % WEIGHT BASED INFUSION
3.0000 mL/kg/h | INTRAVENOUS | Status: DC
  Administered 2017-01-25: 3 mL/kg/h via INTRAVENOUS

## 2017-01-25 MED ORDER — IOPAMIDOL (ISOVUE-370) INJECTION 76%
INTRAVENOUS | Status: DC | PRN
  Administered 2017-01-25: 100 mL via INTRA_ARTERIAL

## 2017-01-25 MED ORDER — HEPARIN (PORCINE) IN NACL 2-0.9 UNIT/ML-% IJ SOLN
INTRAMUSCULAR | Status: AC
  Filled 2017-01-25: qty 1000

## 2017-01-25 SURGICAL SUPPLY — 16 items
CATH BALLN WEDGE 5F 110CM (CATHETERS) ×2 IMPLANT
CATH INFINITI 5FR AL1 (CATHETERS) ×4 IMPLANT
CATH INFINITI 5FR ANG PIGTAIL (CATHETERS) ×2 IMPLANT
CATH INFINITI 5FR JL5 (CATHETERS) ×2 IMPLANT
CATH OPTITORQUE TIG 4.0 5F (CATHETERS) ×2 IMPLANT
DEVICE RAD COMP TR BAND LRG (VASCULAR PRODUCTS) ×2 IMPLANT
GLIDESHEATH SLEND SS 6F .021 (SHEATH) ×2 IMPLANT
GUIDEWIRE INQWIRE 1.5J.035X260 (WIRE) ×1 IMPLANT
INQWIRE 1.5J .035X260CM (WIRE) ×2
KIT HEART LEFT (KITS) ×2 IMPLANT
PACK CARDIAC CATHETERIZATION (CUSTOM PROCEDURE TRAY) ×2 IMPLANT
SHEATH GLIDE SLENDER 4/5FR (SHEATH) ×2 IMPLANT
SYR MEDRAD MARK V 150ML (SYRINGE) ×2 IMPLANT
TRANSDUCER W/STOPCOCK (MISCELLANEOUS) ×2 IMPLANT
TUBING CIL FLEX 10 FLL-RA (TUBING) ×2 IMPLANT
WIRE EMERALD ST .035X260CM (WIRE) ×2 IMPLANT

## 2017-01-25 NOTE — Telephone Encounter (Signed)
Patient had cath 5/16. Encounter closed.

## 2017-01-25 NOTE — Interval H&P Note (Signed)
History and Physical Interval Note:  01/25/2017 1:10 PM  James Mayo  has presented today for surgery, with the diagnosis of thoracic aneurysm with rupture  The various methods of treatment have been discussed with the patient and family. After consideration of risks, benefits and other options for treatment, the patient has consented to  Procedure(s): Right/Left Heart Cath and Coronary Angiography (N/A) as a surgical intervention .  The patient's history has been reviewed, patient examined, no change in status, stable for surgery.  I have reviewed the patient's chart and labs.  Questions were answered to the patient's satisfaction.     Shelva Majestic

## 2017-01-25 NOTE — Discharge Instructions (Signed)
Radial Site Care °Refer to this sheet in the next few weeks. These instructions provide you with information about caring for yourself after your procedure. Your health care provider may also give you more specific instructions. Your treatment has been planned according to current medical practices, but problems sometimes occur. Call your health care provider if you have any problems or questions after your procedure. °What can I expect after the procedure? °After your procedure, it is typical to have the following: °· Bruising at the radial site that usually fades within 1-2 weeks. °· Blood collecting in the tissue (hematoma) that may be painful to the touch. It should usually decrease in size and tenderness within 1-2 weeks. °Follow these instructions at home: °· Take medicines only as directed by your health care provider. °· You may shower 24-48 hours after the procedure or as directed by your health care provider. Remove the bandage (dressing) and gently wash the site with plain soap and water. Pat the area dry with a clean towel. Do not rub the site, because this may cause bleeding. °· Do not take baths, swim, or use a hot tub until your health care provider approves. °· Check your insertion site every day for redness, swelling, or drainage. °· Do not apply powder or lotion to the site. °· Do not flex or bend the affected arm for 24 hours or as directed by your health care provider. °· Do not push or pull heavy objects with the affected arm for 24 hours or as directed by your health care provider. °· Do not lift over 10 lb (4.5 kg) for 5 days after your procedure or as directed by your health care provider. °· Ask your health care provider when it is okay to: °¨ Return to work or school. °¨ Resume usual physical activities or sports. °¨ Resume sexual activity. °· Do not drive home if you are discharged the same day as the procedure. Have someone else drive you. °· You may drive 24 hours after the procedure  unless otherwise instructed by your health care provider. °· Do not operate machinery or power tools for 24 hours after the procedure. °· If your procedure was done as an outpatient procedure, which means that you went home the same day as your procedure, a responsible adult should be with you for the first 24 hours after you arrive home. °· Keep all follow-up visits as directed by your health care provider. This is important. °Contact a health care provider if: °· You have a fever. °· You have chills. °· You have increased bleeding from the radial site. Hold pressure on the site. °Get help right away if: °· You have unusual pain at the radial site. °· You have redness, warmth, or swelling at the radial site. °· You have drainage (other than a small amount of blood on the dressing) from the radial site. °· The radial site is bleeding, and the bleeding does not stop after 30 minutes of holding steady pressure on the site. °· Your arm or hand becomes pale, cool, tingly, or numb. °This information is not intended to replace advice given to you by your health care provider. Make sure you discuss any questions you have with your health care provider. °Document Released: 10/01/2010 Document Revised: 02/04/2016 Document Reviewed: 03/17/2014 °Elsevier Interactive Patient Education © 2017 Elsevier Inc. ° °
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
## 2017-01-26 ENCOUNTER — Encounter (HOSPITAL_COMMUNITY): Payer: Self-pay | Admitting: Cardiovascular Disease

## 2017-02-02 ENCOUNTER — Encounter: Payer: Self-pay | Admitting: Cardiothoracic Surgery

## 2017-02-02 ENCOUNTER — Other Ambulatory Visit: Payer: Self-pay | Admitting: *Deleted

## 2017-02-02 ENCOUNTER — Ambulatory Visit (INDEPENDENT_AMBULATORY_CARE_PROVIDER_SITE_OTHER): Payer: 59 | Admitting: Cardiothoracic Surgery

## 2017-02-02 VITALS — BP 173/90 | HR 55 | Resp 16 | Ht 68.0 in | Wt 135.0 lb

## 2017-02-02 DIAGNOSIS — I712 Thoracic aortic aneurysm, without rupture, unspecified: Secondary | ICD-10-CM

## 2017-02-02 DIAGNOSIS — I77810 Thoracic aortic ectasia: Secondary | ICD-10-CM
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
## 2017-02-19 ENCOUNTER — Telehealth: Payer: Self-pay | Admitting: Physician Assistant

## 2017-02-19 DIAGNOSIS — E039 Hypothyroidism, unspecified: Secondary | ICD-10-CM

## 2017-02-19 NOTE — Telephone Encounter (Signed)
Please contact patient.  Can you have patient return for a labs only tsh and free t4 within the next 1-2 weeks.  I want to recheck this.  It was not elevated enough to warrant changing dosage, but we should make sure it has normalized.

## 2017-02-22 NOTE — Telephone Encounter (Signed)
Called , and left a message on AM asking the patient to RTC for lab. Only. Advised to call back if has any questions.

## 2017-02-28 ENCOUNTER — Ambulatory Visit (INDEPENDENT_AMBULATORY_CARE_PROVIDER_SITE_OTHER): Payer: 59 | Admitting: Sports Medicine

## 2017-02-28 VITALS — BP 120/74

## 2017-02-28 DIAGNOSIS — S2241XD Multiple fractures of ribs, right side, subsequent encounter for fracture with routine healing: Secondary | ICD-10-CM | POA: Diagnosis not present
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
## 2017-02-28 NOTE — Patient Instructions (Signed)
Chest Expansion Exercises: 1. Lift 2 pound weighs in three planes shown in clinic taking a deep breath with each rep. Do 5-10 reps in each plane. 2. H and T exercise with weights, taking 5 deep breaths with each rep. Do 3 reps of each.  Follow up after surgery.

## 2017-03-02 ENCOUNTER — Ambulatory Visit (HOSPITAL_COMMUNITY)
Admission: RE | Admit: 2017-03-02 | Discharge: 2017-03-02 | Disposition: A | Discharge status: Home / Self Care | Payer: 59 | Source: Ambulatory Visit | Attending: Cardiothoracic Surgery | Admitting: Cardiothoracic Surgery

## 2017-03-02 ENCOUNTER — Ambulatory Visit (INDEPENDENT_AMBULATORY_CARE_PROVIDER_SITE_OTHER): Payer: 59 | Admitting: Cardiothoracic Surgery

## 2017-03-02 ENCOUNTER — Encounter (HOSPITAL_COMMUNITY): Payer: Self-pay

## 2017-03-02 ENCOUNTER — Ambulatory Visit (HOSPITAL_BASED_OUTPATIENT_CLINIC_OR_DEPARTMENT_OTHER)
Admission: RE | Admit: 2017-03-02 | Discharge: 2017-03-02 | Disposition: A | Discharge status: Home / Self Care | Payer: 59 | Source: Ambulatory Visit | Attending: Cardiothoracic Surgery | Admitting: Cardiothoracic Surgery

## 2017-03-02 ENCOUNTER — Encounter: Payer: Self-pay | Admitting: Cardiothoracic Surgery

## 2017-03-02 VITALS — BP 149/80 | HR 59 | Resp 20 | Ht 67.0 in | Wt 133.4 lb

## 2017-03-02 DIAGNOSIS — R001 Bradycardia, unspecified: Secondary | ICD-10-CM | POA: Diagnosis not present

## 2017-03-02 DIAGNOSIS — I2510 Atherosclerotic heart disease of native coronary artery without angina pectoris: Secondary | ICD-10-CM | POA: Insufficient documentation

## 2017-03-02 DIAGNOSIS — Z85828 Personal history of other malignant neoplasm of skin: Secondary | ICD-10-CM | POA: Diagnosis not present

## 2017-03-02 DIAGNOSIS — I77810 Thoracic aortic ectasia: Secondary | ICD-10-CM | POA: Insufficient documentation

## 2017-03-02 DIAGNOSIS — Z0183 Encounter for blood typing: Secondary | ICD-10-CM | POA: Insufficient documentation

## 2017-03-02 DIAGNOSIS — Q231 Congenital insufficiency of aortic valve: Secondary | ICD-10-CM | POA: Diagnosis not present

## 2017-03-02 DIAGNOSIS — Z79899 Other long term (current) drug therapy: Secondary | ICD-10-CM | POA: Insufficient documentation

## 2017-03-02 DIAGNOSIS — Z01818 Encounter for other preprocedural examination: Secondary | ICD-10-CM | POA: Diagnosis not present

## 2017-03-02 DIAGNOSIS — Z01812 Encounter for preprocedural laboratory examination: Secondary | ICD-10-CM | POA: Diagnosis not present

## 2017-03-02 DIAGNOSIS — R9431 Abnormal electrocardiogram [ECG] [EKG]: Secondary | ICD-10-CM | POA: Insufficient documentation

## 2017-03-02 DIAGNOSIS — S2231XA Fracture of one rib, right side, initial encounter for closed fracture: Secondary | ICD-10-CM | POA: Diagnosis not present

## 2017-03-02 DIAGNOSIS — I712 Thoracic aortic aneurysm, without rupture, unspecified: Secondary | ICD-10-CM

## 2017-03-02 DIAGNOSIS — I517 Cardiomegaly: Secondary | ICD-10-CM | POA: Insufficient documentation

## 2017-03-02 DIAGNOSIS — E039 Hypothyroidism, unspecified: Secondary | ICD-10-CM | POA: Diagnosis not present

## 2017-03-02 HISTORY — DX: Atherosclerotic heart disease of native coronary artery without angina pectoris: I25.10

## 2017-03-02 HISTORY — DX: Thoracic aortic aneurysm, without rupture: I71.2

## 2017-03-02 HISTORY — DX: Aneurysm of the ascending aorta, without rupture: I71.21

## 2017-03-02 LAB — URINALYSIS, ROUTINE W REFLEX MICROSCOPIC
Bilirubin Urine: NEGATIVE
Glucose, UA: NEGATIVE mg/dL
Hgb urine dipstick: NEGATIVE
Ketones, ur: NEGATIVE mg/dL
Leukocytes, UA: NEGATIVE
Nitrite: NEGATIVE
Protein, ur: NEGATIVE mg/dL
Specific Gravity, Urine: 1.024 (ref 1.005–1.030)
pH: 5 (ref 5.0–8.0)

## 2017-03-02 LAB — PULMONARY FUNCTION TEST
DL/VA % pred: 107 %
DL/VA: 4.75 ml/min/mmHg/L
DLCO unc % pred: 111 %
DLCO unc: 31.5 ml/min/mmHg
FEF 25-75 Post: 2.93 L/sec
FEF 25-75 Pre: 2.52 L/sec
FEF2575-%Change-Post: 16 %
FEF2575-%Pred-Post: 122 %
FEF2575-%Pred-Pre: 105 %
FEV1-%Change-Post: 7 %
FEV1-%Pred-Post: 113 %
FEV1-%Pred-Pre: 105 %
FEV1-Post: 3.42 L
FEV1-Pre: 3.17 L
FEV1FVC-%Change-Post: 4 %
FEV1FVC-%Pred-Pre: 99 %
FEV6-%Change-Post: 4 %
FEV6-%Pred-Post: 114 %
FEV6-%Pred-Pre: 109 %
FEV6-Post: 4.41 L
FEV6-Pre: 4.21 L
FEV6FVC-%Change-Post: 1 %
FEV6FVC-%Pred-Post: 105 %
FEV6FVC-%Pred-Pre: 103 %
FVC-%Change-Post: 3 %
FVC-%Pred-Post: 109 %
FVC-%Pred-Pre: 106 %
FVC-Post: 4.47 L
FVC-Pre: 4.32 L
Post FEV1/FVC ratio: 76 %
Post FEV6/FVC ratio: 99 %
Pre FEV1/FVC ratio: 73 %
Pre FEV6/FVC Ratio: 97 %
RV % pred: 101 %
RV: 2.24 L
TLC % pred: 104 %
TLC: 6.71 L

## 2017-03-02 LAB — COMPREHENSIVE METABOLIC PANEL
ALT: 22 U/L (ref 17–63)
AST: 29 U/L (ref 15–41)
Albumin: 4.3 g/dL (ref 3.5–5.0)
Alkaline Phosphatase: 78 U/L (ref 38–126)
Anion gap: 8 (ref 5–15)
BUN: 22 mg/dL — ABNORMAL HIGH (ref 6–20)
CO2: 22 mmol/L (ref 22–32)
Calcium: 9.4 mg/dL (ref 8.9–10.3)
Chloride: 107 mmol/L (ref 101–111)
Creatinine, Ser: 0.79 mg/dL (ref 0.61–1.24)
GFR calc Af Amer: >60 mL/min (ref >60)
GFR calc non Af Amer: >60 mL/min (ref >60)
Glucose, Bld: 108 mg/dL — ABNORMAL HIGH (ref 65–99)
Potassium: 4.2 mmol/L (ref 3.5–5.1)
Sodium: 137 mmol/L (ref 135–145)
Total Bilirubin: 1.1 mg/dL (ref 0.3–1.2)
Total Protein: 6.7 g/dL (ref 6.5–8.1)

## 2017-03-02 LAB — VAS US DOPPLER PRE CABG
LEFT ECA DIAS: -10 cm/s
LEFT VERTEBRAL DIAS: -31 cm/s
Left CCA dist dias: 21 cm/s
Left CCA dist sys: 71 cm/s
Left CCA prox dias: 18 cm/s
Left CCA prox sys: 116 cm/s
Left ICA dist dias: -32 cm/s
Left ICA dist sys: -84 cm/s
Left ICA prox dias: 18 cm/s
Left ICA prox sys: 60 cm/s
RIGHT ECA DIAS: -17 cm/s
RIGHT VERTEBRAL DIAS: 17 cm/s
Right CCA prox dias: 19 cm/s
Right CCA prox sys: 89 cm/s
Right cca dist sys: -99 cm/s

## 2017-03-02 LAB — BLOOD GAS, ARTERIAL
Acid-Base Excess: 0.4 mmol/L (ref 0.0–2.0)
Bicarbonate: 23.7 mmol/L (ref 20.0–28.0)
Drawn by: 449841
FIO2: 21
O2 Saturation: 94.8 %
Patient temperature: 98.6
pCO2 arterial: 33.2 mmHg (ref 32.0–48.0)
pH, Arterial: 7.468 — ABNORMAL HIGH (ref 7.350–7.450)
pO2, Arterial: 70.4 mmHg — ABNORMAL LOW (ref 83.0–108.0)

## 2017-03-02 LAB — PROTIME-INR
INR: 0.99
Prothrombin Time: 13.1 seconds (ref 11.4–15.2)

## 2017-03-02 LAB — CBC
HCT: 42.3 % (ref 39.0–52.0)
Hemoglobin: 14.2 g/dL (ref 13.0–17.0)
MCH: 32.9 pg (ref 26.0–34.0)
MCHC: 33.6 g/dL (ref 30.0–36.0)
MCV: 97.9 fL (ref 78.0–100.0)
Platelets: 203 10*3/uL (ref 150–400)
RBC: 4.32 MIL/uL (ref 4.22–5.81)
RDW: 13.4 % (ref 11.5–15.5)
WBC: 6.5 10*3/uL (ref 4.0–10.5)

## 2017-03-02 LAB — SURGICAL PCR SCREEN
MRSA, PCR: NEGATIVE
Staphylococcus aureus: NEGATIVE

## 2017-03-02 LAB — APTT: aPTT: 24 seconds (ref 24–36)

## 2017-03-02 LAB — ABO/RH: ABO/RH(D): A POS

## 2017-03-02 MED ORDER — ALBUTEROL SULFATE (2.5 MG/3ML) 0.083% IN NEBU
2.5000 mg | INHALATION_SOLUTION | Freq: Once | RESPIRATORY_TRACT | Status: AC
  Administered 2017-03-02: 2.5 mg via RESPIRATORY_TRACT
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
## 2017-03-02 NOTE — Progress Notes (Signed)
Pre-op Cardiac Surgery  Carotid Findings:  No evidence of a significant stenosis noted in bilateral carotid arteries.   Upper Extremity Right Left  Brachial Pressures Triphasic/ 138 Triphasic/ 137  Radial Waveforms Triphasic Triphasic  Ulnar Waveforms Triphasic Triphasic  Palmar Arch (Allen's Test) WNL WNL   Doppler waveforms remain within normal limits with compression bilaterally. Oda Cogan, BS, RDMS, RVT

## 2017-03-02 NOTE — Pre-Procedure Instructions (Signed)
ZACHAREY JENSEN  03/02/2017      [place], [place] - [address] [place] [address] Phone: [phone] Fax: [phone]    Your procedure is scheduled on 03/06/2017- Monday   Report to [facility] Admitting at 5:30 A.M.    Call this number if you have problems the morning of surgery:  [phone]   Remember:  Do not eat food or drink liquids after midnight. On Sunday    Take these medicines the morning of surgery with A SIP OF WATER : Levothyroxine    Do not wear jewelry   Do not wear lotions, powders, or perfumes, or deoderant.              Men may shave face and neck.   Do not bring valuables to the hospital.  [facility] is not responsible for any belongings or valuables.  Contacts, dentures or bridgework may not be worn into surgery.  Leave your suitcase in the car.  After surgery it may be brought to your room.  For patients admitted to the hospital, discharge time will be determined by your treatment team.  Patients discharged the day of surgery will not be allowed to drive home.   Name and phone number of your driver:   With family  Special instructions:  Special Instructions:  - Preparing for Surgery  Before surgery, you can play an important role.  Because skin is not sterile, your skin needs to be as free of germs as possible.  You can reduce the number of germs on you skin by washing with CHG (chlorahexidine gluconate) soap before surgery.  CHG is an antiseptic cleaner which kills germs and bonds with the skin to continue killing germs even after washing.  Please DO NOT use if you have an allergy to CHG or antibacterial soaps.  If your skin becomes reddened/irritated stop using the CHG and inform your nurse when you arrive at Short Stay.  Do not shave (including legs and underarms) for at least 48 hours prior to the first CHG shower.  You may shave your  face.  Please follow these instructions carefully:   1.  Shower with CHG Soap the night before surgery and the  morning of Surgery.  2.  If you choose to wash your hair, wash your hair first as usual with your  normal shampoo.  3.  After you shampoo, rinse your hair and body thoroughly to remove the  Shampoo.  4.  Use CHG as you would any other liquid soap.  You can apply chg directly to the skin and wash gently with scrungie or a clean washcloth.  5.  Apply the CHG Soap to your body ONLY FROM THE NECK DOWN.    Do not use on open wounds or open sores.  Avoid contact with your eyes, ears, mouth and genitals (private parts).  Wash genitals (private parts)   with your normal soap.  6.  Wash thoroughly, paying special attention to the area where your surgery will be performed.  7.  Thoroughly rinse your body with warm water from the neck down.  8.  DO NOT shower/wash with your normal soap after using and rinsing off   the CHG Soap.  9.  Pat yourself dry with a clean towel.            10 .  Wear clean pajamas.  11.  Place clean sheets on your bed the night of your first shower and do not sleep with pets.  Day of Surgery  Do not apply any lotions/deodorants the morning of surgery.  Please wear clean clothes to the hospital/surgery center.  Please read over the following fact sheets that you were given. Pain Booklet, Coughing and Deep Breathing, MRSA Information and Surgical Site Infection Prevention
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
## 2017-03-03 ENCOUNTER — Encounter (HOSPITAL_COMMUNITY): Payer: Self-pay

## 2017-03-03 LAB — HEMOGLOBIN A1C
Hgb A1c MFr Bld: 5.5 % (ref 4.8–5.6)
Mean Plasma Glucose: 111 mg/dL
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
## 2017-03-05 MED ORDER — PLASMA-LYTE 148 IV SOLN
INTRAVENOUS | Status: DC
  Filled 2017-03-05: qty 2.5

## 2017-03-05 MED ORDER — DEXMEDETOMIDINE HCL IN NACL 400 MCG/100ML IV SOLN
0.1000 ug/kg/h | INTRAVENOUS | Status: AC
  Administered 2017-03-06: .3 ug/kg/h via INTRAVENOUS
  Filled 2017-03-05: qty 100

## 2017-03-05 MED ORDER — INSULIN REGULAR HUMAN 100 UNIT/ML IJ SOLN
INTRAMUSCULAR | Status: AC
  Administered 2017-03-06: .9 [IU]/h via INTRAVENOUS
  Filled 2017-03-05: qty 1

## 2017-03-05 MED ORDER — DEXTROSE 5 % IV SOLN
750.0000 mg | INTRAVENOUS | Status: DC
  Filled 2017-03-05: qty 750

## 2017-03-05 MED ORDER — NITROGLYCERIN IN D5W 200-5 MCG/ML-% IV SOLN
2.0000 ug/min | INTRAVENOUS | Status: AC
  Administered 2017-03-06: 10 ug/min via INTRAVENOUS
  Filled 2017-03-05: qty 250

## 2017-03-05 MED ORDER — DOPAMINE-DEXTROSE 3.2-5 MG/ML-% IV SOLN
0.0000 ug/kg/min | INTRAVENOUS | Status: AC
  Administered 2017-03-06: 3 ug/kg/min via INTRAVENOUS
  Filled 2017-03-05: qty 250

## 2017-03-05 MED ORDER — EPINEPHRINE PF 1 MG/ML IJ SOLN
0.0000 ug/min | INTRAVENOUS | Status: DC
  Filled 2017-03-05: qty 4

## 2017-03-05 MED ORDER — TRANEXAMIC ACID (OHS) BOLUS VIA INFUSION
15.0000 mg/kg | INTRAVENOUS | Status: AC
  Administered 2017-03-06: 907.5 mg via INTRAVENOUS
  Filled 2017-03-05: qty 908

## 2017-03-05 MED ORDER — SODIUM CHLORIDE 0.9 % IV SOLN
INTRAVENOUS | Status: DC
  Filled 2017-03-05: qty 30

## 2017-03-05 MED ORDER — CHLORHEXIDINE GLUCONATE 0.12 % MT SOLN
15.0000 mL | Freq: Once | OROMUCOSAL | Status: AC
  Administered 2017-03-06: 15 mL via OROMUCOSAL
  Filled 2017-03-05: qty 15

## 2017-03-05 MED ORDER — METOPROLOL TARTRATE 12.5 MG HALF TABLET
12.5000 mg | ORAL_TABLET | Freq: Once | ORAL | Status: DC
  Filled 2017-03-05: qty 1

## 2017-03-05 MED ORDER — SODIUM CHLORIDE 0.9 % IV SOLN
30.0000 ug/min | INTRAVENOUS | Status: AC
  Administered 2017-03-06: 100 ug/min via INTRAVENOUS
  Filled 2017-03-05: qty 2

## 2017-03-05 MED ORDER — VANCOMYCIN HCL 10 G IV SOLR
1250.0000 mg | INTRAVENOUS | Status: AC
  Administered 2017-03-06: 1250 mg via INTRAVENOUS
  Filled 2017-03-05: qty 1250

## 2017-03-05 MED ORDER — MAGNESIUM SULFATE 50 % IJ SOLN
40.0000 meq | INTRAMUSCULAR | Status: DC
Start: 2017-03-06 — End: 2017-03-06
  Filled 2017-03-05: qty 10

## 2017-03-05 MED ORDER — TRANEXAMIC ACID 1000 MG/10ML IV SOLN
1.5000 mg/kg/h | INTRAVENOUS | Status: AC
  Administered 2017-03-06: 1.5 mg/kg/h via INTRAVENOUS
  Filled 2017-03-05: qty 25

## 2017-03-05 MED ORDER — DEXTROSE 5 % IV SOLN
1.5000 g | INTRAVENOUS | Status: AC
  Administered 2017-03-06: .75 g via INTRAVENOUS
  Administered 2017-03-06: 1.5 g via INTRAVENOUS
  Filled 2017-03-05: qty 1.5

## 2017-03-05 MED ORDER — POTASSIUM CHLORIDE 2 MEQ/ML IV SOLN
80.0000 meq | INTRAVENOUS | Status: DC
Start: 2017-03-06 — End: 2017-03-06
  Filled 2017-03-05: qty 40

## 2017-03-05 MED ORDER — TRANEXAMIC ACID (OHS) PUMP PRIME SOLUTION
2.0000 mg/kg | INTRAVENOUS | Status: DC
  Filled 2017-03-05: qty 1.21

## 2017-03-06 ENCOUNTER — Inpatient Hospital Stay (HOSPITAL_COMMUNITY): Payer: 59

## 2017-03-06 ENCOUNTER — Encounter (HOSPITAL_COMMUNITY): Payer: Self-pay | Admitting: *Deleted

## 2017-03-06 ENCOUNTER — Inpatient Hospital Stay (HOSPITAL_COMMUNITY)
Admission: RE | Admit: 2017-03-06 | Discharge: 2017-03-12 | DRG: 220 | Disposition: A | Discharge status: Home / Self Care | Payer: 59 | Source: Ambulatory Visit | Attending: Cardiothoracic Surgery | Admitting: Cardiothoracic Surgery

## 2017-03-06 ENCOUNTER — Inpatient Hospital Stay (HOSPITAL_COMMUNITY): Payer: 59 | Admitting: Certified Registered"

## 2017-03-06 ENCOUNTER — Inpatient Hospital Stay (HOSPITAL_COMMUNITY): Payer: 59 | Admitting: Vascular Surgery

## 2017-03-06 ENCOUNTER — Encounter (HOSPITAL_COMMUNITY)
Admission: RE | Disposition: A | Discharge status: Home / Self Care | Payer: Self-pay | Source: Ambulatory Visit | Attending: Cardiothoracic Surgery

## 2017-03-06 DIAGNOSIS — I4891 Unspecified atrial fibrillation: Secondary | ICD-10-CM | POA: Diagnosis not present

## 2017-03-06 DIAGNOSIS — I712 Thoracic aortic aneurysm, without rupture: Secondary | ICD-10-CM | POA: Diagnosis not present

## 2017-03-06 DIAGNOSIS — I7102 Dissection of abdominal aorta: Secondary | ICD-10-CM | POA: Diagnosis not present

## 2017-03-06 DIAGNOSIS — E039 Hypothyroidism, unspecified: Secondary | ICD-10-CM | POA: Diagnosis present

## 2017-03-06 DIAGNOSIS — I440 Atrioventricular block, first degree: Secondary | ICD-10-CM | POA: Diagnosis not present

## 2017-03-06 DIAGNOSIS — R001 Bradycardia, unspecified: Secondary | ICD-10-CM | POA: Diagnosis present

## 2017-03-06 DIAGNOSIS — C44310 Basal cell carcinoma of skin of unspecified parts of face: Secondary | ICD-10-CM | POA: Diagnosis present

## 2017-03-06 DIAGNOSIS — E8770 Fluid overload, unspecified: Secondary | ICD-10-CM | POA: Diagnosis not present

## 2017-03-06 DIAGNOSIS — D696 Thrombocytopenia, unspecified: Secondary | ICD-10-CM | POA: Diagnosis present

## 2017-03-06 DIAGNOSIS — Z952 Presence of prosthetic heart valve: Secondary | ICD-10-CM

## 2017-03-06 DIAGNOSIS — I2510 Atherosclerotic heart disease of native coronary artery without angina pectoris: Secondary | ICD-10-CM | POA: Diagnosis present

## 2017-03-06 DIAGNOSIS — Z8249 Family history of ischemic heart disease and other diseases of the circulatory system: Secondary | ICD-10-CM | POA: Diagnosis not present

## 2017-03-06 DIAGNOSIS — I480 Paroxysmal atrial fibrillation: Secondary | ICD-10-CM

## 2017-03-06 DIAGNOSIS — Z95828 Presence of other vascular implants and grafts: Secondary | ICD-10-CM | POA: Diagnosis not present

## 2017-03-06 DIAGNOSIS — Z09 Encounter for follow-up examination after completed treatment for conditions other than malignant neoplasm: Secondary | ICD-10-CM

## 2017-03-06 DIAGNOSIS — I77810 Thoracic aortic ectasia: Secondary | ICD-10-CM

## 2017-03-06 DIAGNOSIS — I350 Nonrheumatic aortic (valve) stenosis: Secondary | ICD-10-CM | POA: Diagnosis not present

## 2017-03-06 DIAGNOSIS — J9811 Atelectasis: Secondary | ICD-10-CM | POA: Diagnosis not present

## 2017-03-06 DIAGNOSIS — D62 Acute posthemorrhagic anemia: Secondary | ICD-10-CM | POA: Diagnosis not present

## 2017-03-06 DIAGNOSIS — M7742 Metatarsalgia, left foot: Secondary | ICD-10-CM | POA: Diagnosis present

## 2017-03-06 DIAGNOSIS — I081 Rheumatic disorders of both mitral and tricuspid valves: Secondary | ICD-10-CM | POA: Diagnosis not present

## 2017-03-06 DIAGNOSIS — Q231 Congenital insufficiency of aortic valve: Secondary | ICD-10-CM | POA: Diagnosis present

## 2017-03-06 DIAGNOSIS — J309 Allergic rhinitis, unspecified: Secondary | ICD-10-CM | POA: Diagnosis present

## 2017-03-06 DIAGNOSIS — I77819 Aortic ectasia, unspecified site: Secondary | ICD-10-CM | POA: Diagnosis not present

## 2017-03-06 DIAGNOSIS — J90 Pleural effusion, not elsewhere classified: Secondary | ICD-10-CM | POA: Diagnosis not present

## 2017-03-06 DIAGNOSIS — I359 Nonrheumatic aortic valve disorder, unspecified: Secondary | ICD-10-CM

## 2017-03-06 HISTORY — DX: Nonrheumatic aortic valve disorder, unspecified: I35.9

## 2017-03-06 HISTORY — PX: AORTIC VALVE REPLACEMENT: SHX41

## 2017-03-06 HISTORY — DX: Paroxysmal atrial fibrillation: I48.0

## 2017-03-06 HISTORY — PX: TEE WITHOUT CARDIOVERSION: SHX5443

## 2017-03-06 HISTORY — PX: REPLACEMENT ASCENDING AORTA: SHX6068

## 2017-03-06 LAB — POCT I-STAT, CHEM 8
BUN: 29 mg/dL — ABNORMAL HIGH (ref 6–20)
BUN: 28 mg/dL — ABNORMAL HIGH (ref 6–20)
BUN: 26 mg/dL — ABNORMAL HIGH (ref 6–20)
BUN: 28 mg/dL — ABNORMAL HIGH (ref 6–20)
BUN: 29 mg/dL — ABNORMAL HIGH (ref 6–20)
BUN: 27 mg/dL — ABNORMAL HIGH (ref 6–20)
Calcium, Ion: 1.24 mmol/L (ref 1.15–1.40)
Calcium, Ion: 1.24 mmol/L (ref 1.15–1.40)
Calcium, Ion: 1 mmol/L — ABNORMAL LOW (ref 1.15–1.40)
Calcium, Ion: 1.13 mmol/L — ABNORMAL LOW (ref 1.15–1.40)
Calcium, Ion: 1.1 mmol/L — ABNORMAL LOW (ref 1.15–1.40)
Calcium, Ion: 1.08 mmol/L — ABNORMAL LOW (ref 1.15–1.40)
Chloride: 106 mmol/L (ref 101–111)
Chloride: 105 mmol/L (ref 101–111)
Chloride: 101 mmol/L (ref 101–111)
Chloride: 105 mmol/L (ref 101–111)
Chloride: 103 mmol/L (ref 101–111)
Chloride: 102 mmol/L (ref 101–111)
Creatinine, Ser: 0.5 mg/dL — ABNORMAL LOW (ref 0.61–1.24)
Creatinine, Ser: 0.6 mg/dL — ABNORMAL LOW (ref 0.61–1.24)
Creatinine, Ser: 0.4 mg/dL — ABNORMAL LOW (ref 0.61–1.24)
Creatinine, Ser: 0.5 mg/dL — ABNORMAL LOW (ref 0.61–1.24)
Creatinine, Ser: 0.6 mg/dL — ABNORMAL LOW (ref 0.61–1.24)
Creatinine, Ser: 0.5 mg/dL — ABNORMAL LOW (ref 0.61–1.24)
Glucose, Bld: 105 mg/dL — ABNORMAL HIGH (ref 65–99)
Glucose, Bld: 116 mg/dL — ABNORMAL HIGH (ref 65–99)
Glucose, Bld: 95 mg/dL (ref 65–99)
Glucose, Bld: 149 mg/dL — ABNORMAL HIGH (ref 65–99)
Glucose, Bld: 181 mg/dL — ABNORMAL HIGH (ref 65–99)
Glucose, Bld: 153 mg/dL — ABNORMAL HIGH (ref 65–99)
HCT: 36 % — ABNORMAL LOW (ref 39.0–52.0)
HCT: 34 % — ABNORMAL LOW (ref 39.0–52.0)
HCT: 27 % — ABNORMAL LOW (ref 39.0–52.0)
HCT: 26 % — ABNORMAL LOW (ref 39.0–52.0)
HCT: 26 % — ABNORMAL LOW (ref 39.0–52.0)
HCT: 27 % — ABNORMAL LOW (ref 39.0–52.0)
Hemoglobin: 12.2 g/dL — ABNORMAL LOW (ref 13.0–17.0)
Hemoglobin: 11.6 g/dL — ABNORMAL LOW (ref 13.0–17.0)
Hemoglobin: 9.2 g/dL — ABNORMAL LOW (ref 13.0–17.0)
Hemoglobin: 8.8 g/dL — ABNORMAL LOW (ref 13.0–17.0)
Hemoglobin: 8.8 g/dL — ABNORMAL LOW (ref 13.0–17.0)
Hemoglobin: 9.2 g/dL — ABNORMAL LOW (ref 13.0–17.0)
Potassium: 4.2 mmol/L (ref 3.5–5.1)
Potassium: 4.3 mmol/L (ref 3.5–5.1)
Potassium: 4.4 mmol/L (ref 3.5–5.1)
Potassium: 4.6 mmol/L (ref 3.5–5.1)
Potassium: 4.2 mmol/L (ref 3.5–5.1)
Potassium: 3.8 mmol/L (ref 3.5–5.1)
Sodium: 141 mmol/L (ref 135–145)
Sodium: 141 mmol/L (ref 135–145)
Sodium: 141 mmol/L (ref 135–145)
Sodium: 139 mmol/L (ref 135–145)
Sodium: 138 mmol/L (ref 135–145)
Sodium: 141 mmol/L (ref 135–145)
TCO2: 27 mmol/L (ref 0–100)
TCO2: 27 mmol/L (ref 0–100)
TCO2: 28 mmol/L (ref 0–100)
TCO2: 28 mmol/L (ref 0–100)
TCO2: 25 mmol/L (ref 0–100)
TCO2: 25 mmol/L (ref 0–100)

## 2017-03-06 LAB — GLUCOSE, CAPILLARY
Glucose-Capillary: 127 mg/dL — ABNORMAL HIGH (ref 65–99)
Glucose-Capillary: 71 mg/dL (ref 65–99)
Glucose-Capillary: 106 mg/dL — ABNORMAL HIGH (ref 65–99)
Glucose-Capillary: 155 mg/dL — ABNORMAL HIGH (ref 65–99)
Glucose-Capillary: 151 mg/dL — ABNORMAL HIGH (ref 65–99)
Glucose-Capillary: 116 mg/dL — ABNORMAL HIGH (ref 65–99)
Glucose-Capillary: 122 mg/dL — ABNORMAL HIGH (ref 65–99)
Glucose-Capillary: 144 mg/dL — ABNORMAL HIGH (ref 65–99)
Glucose-Capillary: 117 mg/dL — ABNORMAL HIGH (ref 65–99)

## 2017-03-06 LAB — POCT I-STAT 3, ART BLOOD GAS (G3+)
Acid-Base Excess: 4 mmol/L — ABNORMAL HIGH (ref 0.0–2.0)
Acid-Base Excess: 2 mmol/L (ref 0.0–2.0)
Acid-base deficit: 2 mmol/L (ref 0.0–2.0)
Acid-base deficit: 3 mmol/L — ABNORMAL HIGH (ref 0.0–2.0)
Acid-base deficit: 6 mmol/L — ABNORMAL HIGH (ref 0.0–2.0)
Bicarbonate: 29.8 mmol/L — ABNORMAL HIGH (ref 20.0–28.0)
Bicarbonate: 29 mmol/L — ABNORMAL HIGH (ref 20.0–28.0)
Bicarbonate: 24.3 mmol/L (ref 20.0–28.0)
Bicarbonate: 21.3 mmol/L (ref 20.0–28.0)
Bicarbonate: 19 mmol/L — ABNORMAL LOW (ref 20.0–28.0)
O2 Saturation: 100 %
O2 Saturation: 100 %
O2 Saturation: 99 %
O2 Saturation: 99 %
O2 Saturation: 96 %
Patient temperature: 36.4
Patient temperature: 36.8
TCO2: 31 mmol/L (ref 0–100)
TCO2: 31 mmol/L (ref 0–100)
TCO2: 26 mmol/L (ref 0–100)
TCO2: 22 mmol/L (ref 0–100)
TCO2: 20 mmol/L (ref 0–100)
pCO2 arterial: 49.2 mmHg — ABNORMAL HIGH (ref 32.0–48.0)
pCO2 arterial: 61.7 mmHg — ABNORMAL HIGH (ref 32.0–48.0)
pCO2 arterial: 46 mmHg (ref 32.0–48.0)
pCO2 arterial: 33.1 mmHg (ref 32.0–48.0)
pCO2 arterial: 33.7 mmHg (ref 32.0–48.0)
pH, Arterial: 7.39 (ref 7.350–7.450)
pH, Arterial: 7.28 — ABNORMAL LOW (ref 7.350–7.450)
pH, Arterial: 7.33 — ABNORMAL LOW (ref 7.350–7.450)
pH, Arterial: 7.413 (ref 7.350–7.450)
pH, Arterial: 7.358 (ref 7.350–7.450)
pO2, Arterial: 351 mmHg — ABNORMAL HIGH (ref 83.0–108.0)
pO2, Arterial: 435 mmHg — ABNORMAL HIGH (ref 83.0–108.0)
pO2, Arterial: 157 mmHg — ABNORMAL HIGH (ref 83.0–108.0)
pO2, Arterial: 156 mmHg — ABNORMAL HIGH (ref 83.0–108.0)
pO2, Arterial: 82 mmHg — ABNORMAL LOW (ref 83.0–108.0)

## 2017-03-06 LAB — CBC
HEMATOCRIT: 35 % — AB (ref 39.0–52.0)
HEMOGLOBIN: 11.6 g/dL — AB (ref 13.0–17.0)
MCH: 32.4 pg (ref 26.0–34.0)
MCHC: 33.1 g/dL (ref 30.0–36.0)
MCV: 97.8 fL (ref 78.0–100.0)
Platelets: 116 10*3/uL — ABNORMAL LOW (ref 150–400)
RBC: 3.58 MIL/uL — AB (ref 4.22–5.81)
RDW: 13.4 % (ref 11.5–15.5)
WBC: 9.5 10*3/uL (ref 4.0–10.5)

## 2017-03-06 LAB — POCT I-STAT 4, (NA,K, GLUC, HGB,HCT)
Glucose, Bld: 44 mg/dL — CL (ref 65–99)
HCT: 30 % — ABNORMAL LOW (ref 39.0–52.0)
Hemoglobin: 10.2 g/dL — ABNORMAL LOW (ref 13.0–17.0)
Potassium: 3.2 mmol/L — ABNORMAL LOW (ref 3.5–5.1)
Sodium: 146 mmol/L — ABNORMAL HIGH (ref 135–145)

## 2017-03-06 LAB — PLATELET COUNT: Platelets: 139 10*3/uL — ABNORMAL LOW (ref 150–400)

## 2017-03-06 LAB — HEMOGLOBIN AND HEMATOCRIT, BLOOD
HCT: 28.9 % — ABNORMAL LOW (ref 39.0–52.0)
Hemoglobin: 9.7 g/dL — ABNORMAL LOW (ref 13.0–17.0)

## 2017-03-06 LAB — ECHO TEE
AO mean calculated velocity dopler: 119 cm/s
AV Mean grad: 7 mmHg
AV Peak grad: 11 mmHg
AV pk vel: 166 cm/s
Ao-asc: 50 cm
FS: 47 % — AB (ref 28–44)
LVOT area: 6.61 cm2
LVOT diameter: 29 mm
P 1/2 time: 734 ms
VTI: 35.9 cm

## 2017-03-06 LAB — PROTIME-INR
INR: 1.48
Prothrombin Time: 18 seconds — ABNORMAL HIGH (ref 11.4–15.2)

## 2017-03-06 LAB — APTT: APTT: 38 s — AB (ref 24–36)

## 2017-03-06 LAB — PREPARE RBC (CROSSMATCH)

## 2017-03-06 LAB — FIBRINOGEN: Fibrinogen: 194 mg/dL — ABNORMAL LOW (ref 210–475)

## 2017-03-06 SURGERY — REPLACEMENT, AORTA, ASCENDING
Anesthesia: General | Site: Chest

## 2017-03-06 MED ORDER — MIDAZOLAM HCL 10 MG/2ML IJ SOLN
INTRAMUSCULAR | Status: AC
  Filled 2017-03-06: qty 2

## 2017-03-06 MED ORDER — MORPHINE SULFATE (PF) 4 MG/ML IV SOLN
2.0000 mg | INTRAVENOUS | Status: DC | PRN
  Administered 2017-03-07: 2 mg via INTRAVENOUS
  Administered 2017-03-07: 4 mg via INTRAVENOUS
  Filled 2017-03-06 (×3): qty 1

## 2017-03-06 MED ORDER — ASPIRIN EC 325 MG PO TBEC
325.0000 mg | DELAYED_RELEASE_TABLET | Freq: Every day | ORAL | Status: DC
  Administered 2017-03-07 – 2017-03-12 (×6): 325 mg via ORAL
  Filled 2017-03-06 (×6): qty 1

## 2017-03-06 MED ORDER — VANCOMYCIN HCL IN DEXTROSE 1-5 GM/200ML-% IV SOLN
1000.0000 mg | Freq: Once | INTRAVENOUS | Status: AC
  Administered 2017-03-06: 1000 mg via INTRAVENOUS
  Filled 2017-03-06: qty 200

## 2017-03-06 MED ORDER — ASPIRIN 81 MG PO CHEW
324.0000 mg | CHEWABLE_TABLET | Freq: Every day | ORAL | Status: DC
  Filled 2017-03-06: qty 4

## 2017-03-06 MED ORDER — BISACODYL 5 MG PO TBEC
10.0000 mg | DELAYED_RELEASE_TABLET | Freq: Every day | ORAL | Status: DC
  Administered 2017-03-07 – 2017-03-12 (×5): 10 mg via ORAL
  Filled 2017-03-06 (×5): qty 2

## 2017-03-06 MED ORDER — ACETAMINOPHEN 500 MG PO TABS
1000.0000 mg | ORAL_TABLET | Freq: Four times a day (QID) | ORAL | Status: AC
  Administered 2017-03-07 – 2017-03-11 (×19): 1000 mg via ORAL
  Filled 2017-03-06 (×20): qty 2

## 2017-03-06 MED ORDER — CHLORHEXIDINE GLUCONATE 4 % EX LIQD: 30.0000 mL | CUTANEOUS | Status: DC

## 2017-03-06 MED ORDER — PROPOFOL 10 MG/ML IV BOLUS
INTRAVENOUS | Status: AC
  Filled 2017-03-06: qty 20

## 2017-03-06 MED ORDER — HEMOSTATIC AGENTS (NO CHARGE) OPTIME
TOPICAL | Status: DC | PRN
  Administered 2017-03-06 (×4): 1 via TOPICAL

## 2017-03-06 MED ORDER — ETOMIDATE 2 MG/ML IV SOLN
INTRAVENOUS | Status: DC | PRN
  Administered 2017-03-06: 20 mg via INTRAVENOUS

## 2017-03-06 MED ORDER — SODIUM CHLORIDE 0.9 % IV SOLN: 250.0000 mL | INTRAVENOUS | Status: DC

## 2017-03-06 MED ORDER — SODIUM CHLORIDE 0.9 % IV SOLN: INTRAVENOUS | Status: DC

## 2017-03-06 MED ORDER — METOCLOPRAMIDE HCL 5 MG/ML IJ SOLN
10.0000 mg | Freq: Four times a day (QID) | INTRAMUSCULAR | Status: AC
  Administered 2017-03-06 – 2017-03-07 (×3): 10 mg via INTRAVENOUS
  Filled 2017-03-06 (×3): qty 2

## 2017-03-06 MED ORDER — FENTANYL CITRATE (PF) 250 MCG/5ML IJ SOLN
INTRAMUSCULAR | Status: DC | PRN
  Administered 2017-03-06: 100 ug via INTRAVENOUS
  Administered 2017-03-06: 300 ug via INTRAVENOUS
  Administered 2017-03-06 (×3): 100 ug via INTRAVENOUS
  Administered 2017-03-06: 150 ug via INTRAVENOUS
  Administered 2017-03-06: 100 ug via INTRAVENOUS
  Administered 2017-03-06: 200 ug via INTRAVENOUS
  Administered 2017-03-06: 100 ug via INTRAVENOUS

## 2017-03-06 MED ORDER — PANTOPRAZOLE SODIUM 40 MG PO TBEC
40.0000 mg | DELAYED_RELEASE_TABLET | Freq: Every day | ORAL | Status: DC
  Administered 2017-03-08 – 2017-03-12 (×5): 40 mg via ORAL
  Filled 2017-03-06 (×5): qty 1

## 2017-03-06 MED ORDER — LACTATED RINGERS IV SOLN: INTRAVENOUS | Status: DC

## 2017-03-06 MED ORDER — LACTATED RINGERS IV SOLN
INTRAVENOUS | Status: DC | PRN
  Administered 2017-03-06: 07:00:00 via INTRAVENOUS

## 2017-03-06 MED ORDER — ROCURONIUM BROMIDE 10 MG/ML (PF) SYRINGE
PREFILLED_SYRINGE | INTRAVENOUS | Status: DC | PRN
  Administered 2017-03-06: 50 mg via INTRAVENOUS
  Administered 2017-03-06: 30 mg via INTRAVENOUS
  Administered 2017-03-06: 20 mg via INTRAVENOUS
  Administered 2017-03-06 (×4): 50 mg via INTRAVENOUS

## 2017-03-06 MED ORDER — BISACODYL 10 MG RE SUPP
10.0000 mg | Freq: Every day | RECTAL | Status: DC
  Filled 2017-03-06: qty 1

## 2017-03-06 MED ORDER — DOCUSATE SODIUM 100 MG PO CAPS
200.0000 mg | ORAL_CAPSULE | Freq: Every day | ORAL | Status: DC
  Administered 2017-03-07 – 2017-03-12 (×5): 200 mg via ORAL
  Filled 2017-03-06 (×5): qty 2

## 2017-03-06 MED ORDER — SODIUM CHLORIDE 0.9% FLUSH: 3.0000 mL | INTRAVENOUS | Status: DC | PRN

## 2017-03-06 MED ORDER — METOPROLOL TARTRATE 5 MG/5ML IV SOLN
2.5000 mg | INTRAVENOUS | Status: DC | PRN
Start: 2017-03-06 — End: 2017-03-12

## 2017-03-06 MED ORDER — NITROGLYCERIN IN D5W 200-5 MCG/ML-% IV SOLN: 0.0000 ug/min | INTRAVENOUS | Status: DC

## 2017-03-06 MED ORDER — METHYLPREDNISOLONE SODIUM SUCC 125 MG IJ SOLR
125.0000 mg | INTRAMUSCULAR | Status: AC
  Administered 2017-03-06: 125 mg via INTRAVENOUS
  Filled 2017-03-06: qty 2

## 2017-03-06 MED ORDER — ACETAMINOPHEN 160 MG/5ML PO SOLN
1000.0000 mg | Freq: Four times a day (QID) | ORAL | Status: AC
  Filled 2017-03-06: qty 40.6

## 2017-03-06 MED ORDER — SODIUM CHLORIDE 0.9 % IV SOLN: 0.0000 ug/min | INTRAVENOUS | Status: DC

## 2017-03-06 MED ORDER — ONDANSETRON HCL 4 MG/2ML IJ SOLN
4.0000 mg | Freq: Four times a day (QID) | INTRAMUSCULAR | Status: DC | PRN
  Administered 2017-03-07 – 2017-03-08 (×4): 4 mg via INTRAVENOUS
  Filled 2017-03-06 (×4): qty 2

## 2017-03-06 MED ORDER — TRAMADOL HCL 50 MG PO TABS
50.0000 mg | ORAL_TABLET | ORAL | Status: DC | PRN
  Administered 2017-03-07 (×3): 50 mg via ORAL
  Administered 2017-03-07 – 2017-03-12 (×9): 100 mg via ORAL
  Filled 2017-03-06 (×4): qty 2
  Filled 2017-03-06: qty 1
  Filled 2017-03-06: qty 2
  Filled 2017-03-06: qty 1
  Filled 2017-03-06: qty 2
  Filled 2017-03-06: qty 1
  Filled 2017-03-06 (×3): qty 2

## 2017-03-06 MED ORDER — ACETAMINOPHEN 650 MG RE SUPP
650.0000 mg | Freq: Once | RECTAL | Status: AC
  Administered 2017-03-06: 650 mg via RECTAL

## 2017-03-06 MED ORDER — LACTATED RINGERS IV SOLN
INTRAVENOUS | Status: DC | PRN
  Administered 2017-03-06 (×2): via INTRAVENOUS

## 2017-03-06 MED ORDER — SODIUM CHLORIDE 0.9% FLUSH
3.0000 mL | Freq: Two times a day (BID) | INTRAVENOUS | Status: DC
  Administered 2017-03-07: 3 mL via INTRAVENOUS

## 2017-03-06 MED ORDER — MORPHINE SULFATE (PF) 4 MG/ML IV SOLN
1.0000 mg | INTRAVENOUS | Status: DC | PRN
  Administered 2017-03-06 (×3): 2 mg via INTRAVENOUS
  Administered 2017-03-06: 4 mg via INTRAVENOUS
  Administered 2017-03-07: 2 mg via INTRAVENOUS
  Filled 2017-03-06 (×4): qty 1

## 2017-03-06 MED ORDER — SODIUM CHLORIDE 0.9 % IV SOLN
INTRAVENOUS | Status: DC | PRN
  Administered 2017-03-06: 13:00:00 via INTRAVENOUS

## 2017-03-06 MED ORDER — ALBUMIN HUMAN 5 % IV SOLN
250.0000 mL | INTRAVENOUS | Status: AC | PRN
  Administered 2017-03-06 (×2): 250 mL via INTRAVENOUS

## 2017-03-06 MED ORDER — ARTIFICIAL TEARS OPHTHALMIC OINT
TOPICAL_OINTMENT | OPHTHALMIC | Status: DC | PRN
  Administered 2017-03-06: 1 via OPHTHALMIC

## 2017-03-06 MED ORDER — ORAL CARE MOUTH RINSE
15.0000 mL | Freq: Four times a day (QID) | OROMUCOSAL | Status: DC
  Administered 2017-03-07 – 2017-03-10 (×4): 15 mL via OROMUCOSAL

## 2017-03-06 MED ORDER — FENTANYL CITRATE (PF) 250 MCG/5ML IJ SOLN
INTRAMUSCULAR | Status: AC
  Filled 2017-03-06: qty 20

## 2017-03-06 MED ORDER — PROTAMINE SULFATE 10 MG/ML IV SOLN
INTRAVENOUS | Status: AC
  Filled 2017-03-06: qty 25

## 2017-03-06 MED ORDER — POTASSIUM CHLORIDE 10 MEQ/50ML IV SOLN
10.0000 meq | INTRAVENOUS | Status: AC
  Administered 2017-03-06 (×3): 10 meq via INTRAVENOUS

## 2017-03-06 MED ORDER — CHLORHEXIDINE GLUCONATE 0.12% ORAL RINSE (MEDLINE KIT)
15.0000 mL | Freq: Two times a day (BID) | OROMUCOSAL | Status: DC
  Administered 2017-03-06 – 2017-03-11 (×3): 15 mL via OROMUCOSAL

## 2017-03-06 MED ORDER — INSULIN REGULAR BOLUS VIA INFUSION
0.0000 [IU] | Freq: Three times a day (TID) | INTRAVENOUS | Status: DC
  Filled 2017-03-06: qty 10

## 2017-03-06 MED ORDER — LACTATED RINGERS IV SOLN: 500.0000 mL | Freq: Once | INTRAVENOUS | Status: DC | PRN

## 2017-03-06 MED ORDER — CHLORHEXIDINE GLUCONATE 0.12 % MT SOLN
15.0000 mL | OROMUCOSAL | Status: AC
  Administered 2017-03-06: 15 mL via OROMUCOSAL

## 2017-03-06 MED ORDER — HEPARIN SODIUM (PORCINE) 1000 UNIT/ML IJ SOLN
INTRAMUSCULAR | Status: AC
  Filled 2017-03-06: qty 4

## 2017-03-06 MED ORDER — SODIUM CHLORIDE 0.45 % IV SOLN: INTRAVENOUS | Status: DC | PRN

## 2017-03-06 MED ORDER — HEPARIN SODIUM (PORCINE) 1000 UNIT/ML IJ SOLN
INTRAMUSCULAR | Status: AC
  Filled 2017-03-06: qty 1

## 2017-03-06 MED ORDER — MORPHINE SULFATE (PF) 2 MG/ML IV SOLN: 2.0000 mg | INTRAVENOUS | Status: DC | PRN

## 2017-03-06 MED ORDER — METOPROLOL TARTRATE 12.5 MG HALF TABLET
12.5000 mg | ORAL_TABLET | Freq: Two times a day (BID) | ORAL | Status: DC
  Administered 2017-03-08 – 2017-03-10 (×5): 12.5 mg via ORAL
  Filled 2017-03-06 (×6): qty 1

## 2017-03-06 MED ORDER — FAMOTIDINE IN NACL 20-0.9 MG/50ML-% IV SOLN
20.0000 mg | Freq: Two times a day (BID) | INTRAVENOUS | Status: AC
  Administered 2017-03-06: 20 mg via INTRAVENOUS
  Filled 2017-03-06: qty 50

## 2017-03-06 MED ORDER — MAGNESIUM SULFATE 4 GM/100ML IV SOLN
4.0000 g | Freq: Once | INTRAVENOUS | Status: AC
  Administered 2017-03-06: 4 g via INTRAVENOUS
  Filled 2017-03-06: qty 100

## 2017-03-06 MED ORDER — LEVOTHYROXINE SODIUM 75 MCG PO TABS
75.0000 ug | ORAL_TABLET | Freq: Every day | ORAL | Status: DC
  Administered 2017-03-07 – 2017-03-12 (×6): 75 ug via ORAL
  Filled 2017-03-06 (×6): qty 1

## 2017-03-06 MED ORDER — OXYCODONE HCL 5 MG PO TABS
5.0000 mg | ORAL_TABLET | ORAL | Status: DC | PRN
  Administered 2017-03-07: 10 mg via ORAL
  Administered 2017-03-07: 5 mg via ORAL
  Administered 2017-03-07: 10 mg via ORAL
  Filled 2017-03-06: qty 2
  Filled 2017-03-06: qty 1
  Filled 2017-03-06: qty 2

## 2017-03-06 MED ORDER — MORPHINE SULFATE (PF) 2 MG/ML IV SOLN: 1.0000 mg | INTRAVENOUS | Status: DC | PRN

## 2017-03-06 MED ORDER — SODIUM CHLORIDE 0.9 % IJ SOLN
OROMUCOSAL | Status: DC | PRN
  Administered 2017-03-06 (×3): 4 mL via TOPICAL

## 2017-03-06 MED ORDER — DEXTROSE 50 % IV SOLN
INTRAVENOUS | Status: AC
  Administered 2017-03-06: 22 mL
  Filled 2017-03-06: qty 50

## 2017-03-06 MED ORDER — DEXTROSE 5 % IV SOLN
1.5000 g | Freq: Two times a day (BID) | INTRAVENOUS | Status: AC
  Administered 2017-03-06 – 2017-03-08 (×4): 1.5 g via INTRAVENOUS
  Filled 2017-03-06 (×4): qty 1.5

## 2017-03-06 MED ORDER — FENTANYL CITRATE (PF) 250 MCG/5ML IJ SOLN
INTRAMUSCULAR | Status: AC
  Filled 2017-03-06: qty 5

## 2017-03-06 MED ORDER — HEPARIN SODIUM (PORCINE) 1000 UNIT/ML IJ SOLN
INTRAMUSCULAR | Status: DC | PRN
  Administered 2017-03-06: 25000 [IU] via INTRAVENOUS
  Administered 2017-03-06: 9000 [IU] via INTRAVENOUS

## 2017-03-06 MED ORDER — ROCURONIUM BROMIDE 10 MG/ML (PF) SYRINGE
PREFILLED_SYRINGE | INTRAVENOUS | Status: AC
  Filled 2017-03-06: qty 5

## 2017-03-06 MED ORDER — METOPROLOL TARTRATE 25 MG/10 ML ORAL SUSPENSION
12.5000 mg | Freq: Two times a day (BID) | ORAL | Status: DC
  Filled 2017-03-06: qty 10

## 2017-03-06 MED ORDER — ACETAMINOPHEN 160 MG/5ML PO SOLN: 650.0000 mg | Freq: Once | ORAL | Status: AC

## 2017-03-06 MED ORDER — 0.9 % SODIUM CHLORIDE (POUR BTL) OPTIME
TOPICAL | Status: DC | PRN
  Administered 2017-03-06: 6000 mL

## 2017-03-06 MED ORDER — PROPOFOL 10 MG/ML IV BOLUS
INTRAVENOUS | Status: DC | PRN
  Administered 2017-03-06: 80 mg via INTRAVENOUS

## 2017-03-06 MED ORDER — SODIUM CHLORIDE 0.9 % IV SOLN: 0.0000 ug/kg/h | INTRAVENOUS | Status: DC

## 2017-03-06 MED ORDER — PROTAMINE SULFATE 10 MG/ML IV SOLN
INTRAVENOUS | Status: DC | PRN
  Administered 2017-03-06 (×5): 50 mg via INTRAVENOUS

## 2017-03-06 MED ORDER — MIDAZOLAM HCL 5 MG/5ML IJ SOLN
INTRAMUSCULAR | Status: DC | PRN
  Administered 2017-03-06: 2 mg via INTRAVENOUS
  Administered 2017-03-06: 4 mg via INTRAVENOUS
  Administered 2017-03-06: 2 mg via INTRAVENOUS

## 2017-03-06 MED ORDER — ARTIFICIAL TEARS OPHTHALMIC OINT
TOPICAL_OINTMENT | OPHTHALMIC | Status: AC
  Filled 2017-03-06: qty 3.5

## 2017-03-06 MED ORDER — MIDAZOLAM HCL 2 MG/2ML IJ SOLN: 2.0000 mg | INTRAMUSCULAR | Status: DC | PRN

## 2017-03-06 MED ORDER — ALBUMIN HUMAN 5 % IV SOLN
INTRAVENOUS | Status: DC | PRN
  Administered 2017-03-06: 14:00:00 via INTRAVENOUS

## 2017-03-06 MED FILL — Albumin, Human Inj 5%: INTRAVENOUS | Qty: 250 | Status: AC

## 2017-03-06 MED FILL — Sodium Chloride IV Soln 0.9%: INTRAVENOUS | Qty: 2000 | Status: AC

## 2017-03-06 MED FILL — Lidocaine HCl IV Inj 20 MG/ML: INTRAVENOUS | Qty: 5 | Status: AC

## 2017-03-06 MED FILL — Heparin Sodium (Porcine) Inj 1000 Unit/ML: INTRAMUSCULAR | Qty: 30 | Status: AC

## 2017-03-06 MED FILL — Mannitol IV Soln 20%: INTRAVENOUS | Qty: 500 | Status: AC

## 2017-03-06 MED FILL — Potassium Chloride Inj 2 mEq/ML: INTRAVENOUS | Qty: 10 | Status: AC

## 2017-03-06 MED FILL — Magnesium Sulfate Inj 50%: INTRAMUSCULAR | Qty: 2 | Status: AC

## 2017-03-06 MED FILL — Electrolyte-R (PH 7.4) Solution: INTRAVENOUS | Qty: 4000 | Status: AC

## 2017-03-06 MED FILL — Sodium Bicarbonate IV Soln 8.4%: INTRAVENOUS | Qty: 50 | Status: AC

## 2017-03-06 SURGICAL SUPPLY — 99 items
ADAPTER CARDIO PERF ANTE/RETRO (ADAPTER) ×4 IMPLANT
ADPR PRFSN 84XANTGRD RTRGD (ADAPTER) ×1
APL SRG 7X2 LUM MLBL SLNT (VASCULAR PRODUCTS) ×3
APPLICATOR COTTON TIP 6IN STRL (MISCELLANEOUS) IMPLANT
APPLICATOR TIP COSEAL (VASCULAR PRODUCTS) ×4 IMPLANT
BAG DECANTER FOR FLEXI CONT (MISCELLANEOUS) ×4 IMPLANT
BLADE STERNUM SYSTEM 6 (BLADE) ×4 IMPLANT
BLADE SURG 15 STRL LF DISP TIS (BLADE) ×3 IMPLANT
BLADE SURG 15 STRL SS (BLADE) ×3
BOOT SUTURE AID YELLOW STND (SUTURE) IMPLANT
CANISTER SUCT 3000ML PPV (MISCELLANEOUS) ×4 IMPLANT
CANN PRFSN .5XCNCT 15X34-48 (MISCELLANEOUS) ×3
CANNULA GUNDRY RCSP 15FR (MISCELLANEOUS) ×8 IMPLANT
CANNULA PRFSN .5XCNCT 15X34-48 (MISCELLANEOUS) ×3 IMPLANT
CANNULA VEN 2 STAGE (MISCELLANEOUS) ×2
CATH CPB KIT GERHARDT (MISCELLANEOUS) ×4 IMPLANT
CATH FOLEY 2WAY SLVR 18FR 30CC (CATHETERS) IMPLANT
CATH HEART VENT LEFT (CATHETERS) ×3 IMPLANT
CATH RETROPLEGIA CORONARY 14FR (CATHETERS) IMPLANT
CATH THORACIC 28FR (CATHETERS) ×4 IMPLANT
CATH/SQUID NICHOLS JEHLE COR (CATHETERS) ×4 IMPLANT
CAUTERY SURG HI TEMP FINE TIP (MISCELLANEOUS) ×4 IMPLANT
CONN ST 1/4X3/8  BEN (MISCELLANEOUS) ×1
CONN ST 1/4X3/8 BEN (MISCELLANEOUS) ×3 IMPLANT
CONT SPEC 4OZ CLIKSEAL STRL BL (MISCELLANEOUS) ×8 IMPLANT
CRADLE DONUT ADULT HEAD (MISCELLANEOUS) ×4 IMPLANT
DRAIN CHANNEL 28F RND 3/8 FF (WOUND CARE) ×8 IMPLANT
DRAIN CHANNEL 32F RND 10.7 FF (WOUND CARE) ×4 IMPLANT
DRAPE CARDIOVASCULAR INCISE (DRAPES) ×3
DRAPE SLUSH/WARMER DISC (DRAPES) ×4 IMPLANT
DRAPE SRG 135X102X78XABS (DRAPES) ×3 IMPLANT
DRSG AQUACEL AG ADV 3.5X14 (GAUZE/BANDAGES/DRESSINGS) ×4 IMPLANT
ELECT BLADE 4.0 EZ CLEAN MEGAD (MISCELLANEOUS) ×4
ELECT CAUTERY BLADE 6.4 (BLADE) ×4 IMPLANT
ELECT REM PT RETURN 9FT ADLT (ELECTROSURGICAL) ×8
ELECTRODE BLDE 4.0 EZ CLN MEGD (MISCELLANEOUS) ×3 IMPLANT
ELECTRODE REM PT RTRN 9FT ADLT (ELECTROSURGICAL) ×6 IMPLANT
GAUZE SPONGE 4X4 12PLY STRL (GAUZE/BANDAGES/DRESSINGS) ×8 IMPLANT
GAUZE SPONGE 4X4 12PLY STRL LF (GAUZE/BANDAGES/DRESSINGS) ×4 IMPLANT
GLOVE BIO SURGEON STRL SZ 6 (GLOVE) ×8 IMPLANT
GLOVE BIO SURGEON STRL SZ 6.5 (GLOVE) ×32 IMPLANT
GOWN STRL REUS W/ TWL LRG LVL3 (GOWN DISPOSABLE) ×24 IMPLANT
GOWN STRL REUS W/TWL LRG LVL3 (GOWN DISPOSABLE) ×24
GRAFT 4 BRANCH 32X50 (Prosthesis & Implant Heart) ×4 IMPLANT
HANDLE STAPLE ENDO GIA SHORT (STAPLE) ×1
HEMOSTAT POWDER SURGIFOAM 1G (HEMOSTASIS) ×16 IMPLANT
HEMOSTAT SURGICEL 2X14 (HEMOSTASIS) ×8 IMPLANT
INSERT FOGARTY 61MM (MISCELLANEOUS) ×4 IMPLANT
INSERT FOGARTY XLG (MISCELLANEOUS) ×4 IMPLANT
IV CATH 18G X1.75 CATHLON (IV SOLUTION) ×4 IMPLANT
KIT BASIN OR (CUSTOM PROCEDURE TRAY) ×4 IMPLANT
KIT ROOM TURNOVER OR (KITS) ×4 IMPLANT
KIT SUCTION CATH 14FR (SUCTIONS) ×20 IMPLANT
LEAD PACING MYOCARDI (MISCELLANEOUS) ×4 IMPLANT
LINE VENT (MISCELLANEOUS) ×4 IMPLANT
LOOP VESSEL SUPERMAXI WHITE (MISCELLANEOUS) ×4 IMPLANT
MARKER SKIN DUAL TIP RULER LAB (MISCELLANEOUS) ×4 IMPLANT
NEEDLE AORTIC AIR ASPIRATING (NEEDLE) ×4 IMPLANT
NS IRRIG 1000ML POUR BTL (IV SOLUTION) ×24 IMPLANT
PACK OPEN HEART (CUSTOM PROCEDURE TRAY) ×4 IMPLANT
PAD ARMBOARD 7.5X6 YLW CONV (MISCELLANEOUS) ×8 IMPLANT
RELOAD ENDO GIA 30 3.5 (STAPLE) ×8 IMPLANT
RELOAD GIA II 30 3.5 (ENDOMECHANICALS) ×8 IMPLANT
SEALANT SURG COSEAL 8ML (VASCULAR PRODUCTS) ×8 IMPLANT
SET CARDIOPLEGIA MPS 5001102 (MISCELLANEOUS) ×4 IMPLANT
SET VEIN GRAFT PERF (SET/KITS/TRAYS/PACK) ×4 IMPLANT
SPONGE LAP 18X18 X RAY DECT (DISPOSABLE) ×8 IMPLANT
STAPLER ENDO GIA 12MM SHORT (STAPLE) ×3 IMPLANT
SUT BONE WAX W31G (SUTURE) ×4 IMPLANT
SUT ETHIBON 2 0 V 52N 30 (SUTURE) ×8 IMPLANT
SUT ETHIBOND 2 0 SH (SUTURE) ×9
SUT ETHIBOND 2 0 SH 36X2 (SUTURE) ×9 IMPLANT
SUT PROLENE 3 0 RB 1 (SUTURE) ×8 IMPLANT
SUT PROLENE 3 0 SH 1 (SUTURE) ×4 IMPLANT
SUT PROLENE 3 0 SH 48 (SUTURE) IMPLANT
SUT PROLENE 3 0 SH1 36 (SUTURE) ×28 IMPLANT
SUT PROLENE 4 0 RB 1 (SUTURE) ×48
SUT PROLENE 4-0 RB1 .5 CRCL 36 (SUTURE) ×36 IMPLANT
SUT PROLENE 5 0 C 1 36 (SUTURE) ×16 IMPLANT
SUT PROLENE 6 0 C 1 30 (SUTURE) ×4 IMPLANT
SUT SILK 1 TIES 10X30 (SUTURE) ×4 IMPLANT
SUT STEEL 6MS V (SUTURE) ×4 IMPLANT
SUT STEEL SZ 6 DBL 3X14 BALL (SUTURE) ×4 IMPLANT
SUT VIC AB 1 CTX 18 (SUTURE) ×8 IMPLANT
SUTURE E-PAK OPEN HEART (SUTURE) ×4 IMPLANT
SYSTEM SAHARA CHEST DRAIN ATS (WOUND CARE) ×4 IMPLANT
TAPE CLOTH SURG 4X10 WHT LF (GAUZE/BANDAGES/DRESSINGS) ×4 IMPLANT
TAPE PAPER 2X10 WHT MICROPORE (GAUZE/BANDAGES/DRESSINGS) ×4 IMPLANT
TOWEL OR 17X24 6PK STRL BLUE (TOWEL DISPOSABLE) ×8 IMPLANT
TOWEL OR 17X26 10 PK STRL BLUE (TOWEL DISPOSABLE) ×8 IMPLANT
TRAY FOLEY SILVER 14FR TEMP (SET/KITS/TRAYS/PACK) ×4 IMPLANT
TRAY FOLEY SILVER 16FR TEMP (SET/KITS/TRAYS/PACK) ×4 IMPLANT
TUBE CONNECTING 12X1/4 (SUCTIONS) ×4 IMPLANT
TUBE FEEDING 8FR 16IN STR KANG (MISCELLANEOUS) ×4 IMPLANT
UNDERPAD 30X30 (UNDERPADS AND DIAPERS) ×4 IMPLANT
VALVE MAGNA EASE AORTIC 25MM (Prosthesis & Implant Heart) ×4 IMPLANT
VENT LEFT HEART 12002 (CATHETERS) ×4
WATER STERILE IRR 1000ML POUR (IV SOLUTION) ×8 IMPLANT
YANKAUER SUCT BULB TIP NO VENT (SUCTIONS) ×4 IMPLANT

## 2017-03-06 NOTE — Transfer of Care (Signed)
Immediate Anesthesia Transfer of Care Note  Patient: James Mayo  Procedure(s) Performed: Procedure(s): REPLACEMENT ASCENDING AORTA using 84mm Hemashield Graft - with Hypothermic Circulatory Arrest (N/A) TRANSESOPHAGEAL ECHOCARDIOGRAM (TEE) (N/A) AORTIC VALVE REPLACEMENT (AVR) using Magna Ease valve Size 23mm  Patient Location: ICU  Anesthesia Type:General  Level of Consciousness: Patient remains intubated per anesthesia plan  Airway & Oxygen Therapy: Patient remains intubated per anesthesia plan and Patient placed on Ventilator (see vital sign flow sheet for setting)  Post-op Assessment: Report given to RN  Post vital signs: Reviewed and stable  Last Vitals:  Vitals:   03/06/17 0705 03/06/17 1419  BP:    Pulse: (!) 48 80  Resp: 19 14  Temp:  (!) 35.9 C    Last Pain:  Vitals:   03/06/17 0616  TempSrc: Oral  PainSc:       Patients Stated Pain Goal: 3 (35/57/32 2025)  Complications: No apparent anesthesia complications

## 2017-03-06 NOTE — Anesthesia Procedure Notes (Signed)
Procedure Name: Intubation Date/Time: 03/06/2017 7:52 AM Performed by: Barrington Ellison Pre-anesthesia Checklist: Patient identified, Emergency Drugs available, Suction available and Patient being monitored Patient Re-evaluated:Patient Re-evaluated prior to inductionOxygen Delivery Method: Circle System Utilized Preoxygenation: Pre-oxygenation with 100% oxygen Intubation Type: IV induction Ventilation: Mask ventilation without difficulty Laryngoscope Size: Mac and 4 Grade View: Grade I Tube type: Oral Tube size: 8.0 mm Number of attempts: 1 Airway Equipment and Method: Stylet and Oral airway Placement Confirmation: ETT inserted through vocal cords under direct vision,  positive ETCO2 and breath sounds checked- equal and bilateral Secured at: 23 cm Tube secured with: Tape Dental Injury: Teeth and Oropharynx as per pre-operative assessment

## 2017-03-06 NOTE — Progress Notes (Signed)
Patient ID: GAY RAPE, male   DOB: 05-30-1951, 66 y.o.   MRN: [id number] EVENING ROUNDS NOTE :     [address]       [place] [address]             [phone]                 Day of Surgery Procedure(s) (LRB): REPLACEMENT ASCENDING AORTA using 28mm Hemashield Graft - with Hypothermic Circulatory Arrest (N/A) TRANSESOPHAGEAL ECHOCARDIOGRAM (TEE) (N/A) AORTIC VALVE REPLACEMENT (AVR) using Magna Ease valve Size 54mm  Total Length of Stay:  LOS: 0 days  BP 113/79   Pulse 80   Temp 98.8 F (37.1 C)   Resp (!) 21   Ht 5\' 7"  (1.702 m)   Wt 133 lb 2.5 oz (60.4 kg)   SpO2 99%   BMI 20.86 kg/m   .Intake/Output      06/25 0701 - 06/26 0700   I.V. (mL/kg) 3537.8 (58.6)   Blood 1000   NG/GT 30   IV Piggyback 1000   Total Intake(mL/kg) 5567.8 (92.2)   Urine (mL/kg/hr) 830 (1.1)   Emesis/NG output 5 (0)   Blood 1400 (1.8)   Chest Tube 650 (0.8)   Total Output 2885   Net +2682.8         . sodium chloride 20 mL/hr at 03/06/17 1500  . [START ON 03/07/2017] sodium chloride    . sodium chloride 20 mL/hr at 03/06/17 1500  . albumin human    . cefUROXime (ZINACEF)  IV    . dexmedetomidine (PRECEDEX) IV infusion Stopped (03/06/17 1700)  . famotidine (PEPCID) IV Stopped (03/06/17 1504)  . insulin (NOVOLIN-R) infusion 1.8 Units/hr (03/06/17 1900)  . lactated ringers    . lactated ringers 20 mL/hr at 03/06/17 1500  . lactated ringers 20 mL/hr at 03/06/17 1500  . nitroGLYCERIN Stopped (03/06/17 1435)  . phenylephrine (NEO-SYNEPHRINE) Adult infusion 5 mcg/min (03/06/17 1940)  . vancomycin       Lab Results  Component Value Date   WBC 9.5 03/06/2017   HGB 11.6 (L) 03/06/2017   HCT 35.0 (L) 03/06/2017   PLT 116 (L) 03/06/2017   GLUCOSE 44 (LL) 03/06/2017   CHOL 195 08/25/2016   TRIG 44 08/25/2016   HDL 101 08/25/2016   LDLCALC 85 08/25/2016   ALT 22 03/02/2017   AST 29 03/02/2017   NA 146 (H) 03/06/2017   K 3.2 (L) 03/06/2017   CL 102 03/06/2017   CREATININE 0.50 (L) 03/06/2017   BUN 27 (H) 03/06/2017   CO2 22 03/02/2017   TSH 5.870 (H) 11/22/2016   INR 1.48 03/06/2017   HGBA1C 5.5 03/02/2017   Now extubated, neuro inatct Not bleeding   Grace Isaac MD  Beeper [phone] Office [phone] 03/06/2017 7:41 PM

## 2017-03-06 NOTE — Brief Op Note (Addendum)
03/06/2017  12:14 PM  PATIENT:  James Mayo  66 y.o. male  PRE-OPERATIVE DIAGNOSIS:  DILATED ASCENDING AORTA and AI   POST-OPERATIVE DIAGNOSIS:  Same   PROCEDURE:  Procedure(s):  WHEAT PROCEDURE -Replacement of Ascending Aorta with a 32 mm Hemashield Side Arm Graft- Wheat Procedure  -25 mm Edwards Magna Ease Aortic Bioprosthetic vavle With hypothermic circulatory arrest  TRANSESOPHAGEAL ECHOCARDIOGRAM (TEE) (N/A)  SURGEON:  Surgeon(s) and Role:    * Grace Isaac, MD - Primary  PHYSICIAN ASSISTANT: Erin Barrett PA-C  ANESTHESIA:   general  EBL:  Total I/O In: 1500 [I.V.:1500] Out: 400 [Urine:400]  BLOOD ADMINISTERED: CELLSAVER  DRAINS: Mediastinal Chest Drains   LOCAL MEDICATIONS USED:  NONE  SPECIMEN:  Source of Specimen:  Aortic Valve Leaflets, Ascending Aorta  DISPOSITION OF SPECIMEN:  PATHOLOGY  COUNTS:  YES   DICTATION: .Dragon Dictation  PLAN OF CARE: Admit to inpatient   PATIENT DISPOSITION:  ICU - intubated and hemodynamically stable.   Delay start of Pharmacological VTE agent (>24hrs) due to surgical blood loss or risk of bleeding: yes
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
## 2017-03-06 NOTE — Procedures (Signed)
Extubation Procedure Note  Patient Details:   Name: TRYPP HECKMANN DOB: [birth year] MRN: [id number]   Airway Documentation:     Evaluation  O2 sats: stable throughout and currently acceptable Complications: No apparent complications Patient did tolerate procedure well. Bilateral Breath Sounds: Clear, Diminished   Yes   NIF -39, VC 1076  Miquel Dunn 03/06/2017, 5:28 PM

## 2017-03-06 NOTE — Anesthesia Postprocedure Evaluation (Signed)
Anesthesia Post Note  Patient: James Mayo  Procedure(s) Performed: Procedure(s) (LRB): REPLACEMENT ASCENDING AORTA using 51mm Hemashield Graft - with Hypothermic Circulatory Arrest (N/A) TRANSESOPHAGEAL ECHOCARDIOGRAM (TEE) (N/A) AORTIC VALVE REPLACEMENT (AVR) using Magna Ease valve Size 14mm     Patient location during evaluation: SICU Anesthesia Type: General Level of consciousness: sedated and patient remains intubated per anesthesia plan Pain management: pain level controlled Vital Signs Assessment: post-procedure vital signs reviewed and stable Respiratory status: patient remains intubated per anesthesia plan and patient on ventilator - see flowsheet for VS Cardiovascular status: blood pressure returned to baseline Anesthetic complications: no    Last Vitals:  Vitals:   03/06/17 1915 03/06/17 1930  BP:    Pulse: 79 80  Resp: (!) 21 (!) 21  Temp: 37.1 C 37.1 C    Last Pain:  Vitals:   03/06/17 1850  TempSrc:   PainSc: 5                  James Mayo

## 2017-03-06 NOTE — Progress Notes (Signed)
Pt dangled at bedside, stood at bedside. Pt tolerated well. RN will continue to monitor.

## 2017-03-06 NOTE — Anesthesia Procedure Notes (Addendum)
Central Venous Catheter Insertion Performed by: Effie Berkshire, anesthesiologist Start/End6/25/2018 6:50 AM, 03/06/2017 6:55 AM Patient location: Pre-op. Preanesthetic checklist: patient identified, IV checked, site marked, risks and benefits discussed, surgical consent, monitors and equipment checked, pre-op evaluation, timeout performed and anesthesia consent Hand hygiene performed  and maximum sterile barriers used  PA cath was placed.Swan type:thermodilution Procedure performed using ultrasound guided technique. Ultrasound Notes:anatomy identified, needle tip was noted to be adjacent to the nerve/plexus identified, no ultrasound evidence of intravascular and/or intraneural injection and image(s) printed for medical record Attempts: 1 Patient tolerated the procedure well with no immediate complications.

## 2017-03-06 NOTE — Progress Notes (Signed)
  Echocardiogram Echocardiogram Transesophageal has been performed.  James Mayo [name] 03/06/2017, 2:35 PM

## 2017-03-06 NOTE — OR Nursing (Signed)
13:15 - 45 minute call to SICU 13:45 - 20 minute call to SICU

## 2017-03-06 NOTE — Anesthesia Preprocedure Evaluation (Signed)
Anesthesia Evaluation  Patient identified by MRN, date of birth, ID band Patient awake    Reviewed: Allergy & Precautions, NPO status , Patient's Chart, lab work & pertinent test results  Airway Mallampati: II  TM Distance: >3 FB Neck ROM: Full    Dental  (+) Teeth Intact, Dental Advisory Given   Pulmonary    breath sounds clear to auscultation       Cardiovascular  Rhythm:Regular Rate:Normal + Diastolic murmurs    Neuro/Psych    GI/Hepatic   Endo/Other    Renal/GU      Musculoskeletal   Abdominal   Peds  Hematology   Anesthesia Other Findings   Reproductive/Obstetrics                             Anesthesia Physical Anesthesia Plan  ASA: III  Anesthesia Plan: General   Post-op Pain Management:    Induction: Intravenous  PONV Risk Score and Plan: Ondansetron  Airway Management Planned: Oral ETT  Additional Equipment: Arterial line, PA Cath, 3D TEE, Ultrasound Guidance Line Placement and CVP  Intra-op Plan:   Post-operative Plan: Post-operative intubation/ventilation  Informed Consent: I have reviewed the patients History and Physical, chart, labs and discussed the procedure including the risks, benefits and alternatives for the proposed anesthesia with the patient or authorized representative who has indicated his/her understanding and acceptance.   Dental advisory given  Plan Discussed with: CRNA  Anesthesia Plan Comments:         Anesthesia Quick Evaluation

## 2017-03-06 NOTE — Anesthesia Procedure Notes (Addendum)
Central Venous Catheter Insertion Performed by: Effie Berkshire, anesthesiologist Start/End6/25/2018 6:40 AM, 03/06/2017 6:50 AM Patient location: Pre-op. Preanesthetic checklist: patient identified, IV checked, site marked, risks and benefits discussed, surgical consent, monitors and equipment checked, pre-op evaluation, timeout performed and anesthesia consent Position: Trendelenburg Lidocaine 1% used for infiltration and patient sedated Hand hygiene performed , maximum sterile barriers used  and Seldinger technique used Catheter size: 9 Fr Total catheter length 10. Central line was placed.MAC introducer Swan type:thermodilution PA Cath depth:50 Procedure performed using ultrasound guided technique. Ultrasound Notes:anatomy identified, needle tip was noted to be adjacent to the nerve/plexus identified, no ultrasound evidence of intravascular and/or intraneural injection and image(s) printed for medical record Attempts: 1 Following insertion, line sutured and dressing applied. Post procedure assessment: blood return through all ports, free fluid flow and no air  Patient tolerated the procedure well with no immediate complications.

## 2017-03-07 ENCOUNTER — Encounter (HOSPITAL_COMMUNITY): Payer: Self-pay | Admitting: Cardiothoracic Surgery

## 2017-03-07 ENCOUNTER — Inpatient Hospital Stay (HOSPITAL_COMMUNITY): Payer: 59

## 2017-03-07 LAB — CREATININE, SERUM
Creatinine, Ser: 1.1 mg/dL (ref 0.61–1.24)
Creatinine, Ser: 1.12 mg/dL (ref 0.61–1.24)
GFR calc Af Amer: >60 mL/min (ref >60)
GFR calc non Af Amer: >60 mL/min (ref >60)
GFR calc non Af Amer: >60 mL/min (ref >60)

## 2017-03-07 LAB — CBC
HCT: 29.4 % — ABNORMAL LOW (ref 39.0–52.0)
HCT: 29.9 % — ABNORMAL LOW (ref 39.0–52.0)
HCT: 30.3 % — ABNORMAL LOW (ref 39.0–52.0)
HEMATOCRIT: 29.4 % — AB (ref 39.0–52.0)
Hemoglobin: 10.2 g/dL — ABNORMAL LOW (ref 13.0–17.0)
Hemoglobin: 9.6 g/dL — ABNORMAL LOW (ref 13.0–17.0)
Hemoglobin: 9.8 g/dL — ABNORMAL LOW (ref 13.0–17.0)
Hemoglobin: 9.8 g/dL — ABNORMAL LOW (ref 13.0–17.0)
MCH: 32.2 pg (ref 26.0–34.0)
MCH: 32.4 pg (ref 26.0–34.0)
MCH: 32.7 pg (ref 26.0–34.0)
MCH: 33.3 pg (ref 26.0–34.0)
MCHC: 32.7 g/dL (ref 30.0–36.0)
MCHC: 32.8 g/dL (ref 30.0–36.0)
MCHC: 33.3 g/dL (ref 30.0–36.0)
MCHC: 33.7 g/dL (ref 30.0–36.0)
MCV: 98 fL (ref 78.0–100.0)
MCV: 98.4 fL (ref 78.0–100.0)
MCV: 99 fL (ref 78.0–100.0)
MCV: 99.3 fL (ref 78.0–100.0)
Platelets: 107 10*3/uL — ABNORMAL LOW (ref 150–400)
Platelets: 110 10*3/uL — ABNORMAL LOW (ref 150–400)
Platelets: 115 10*3/uL — ABNORMAL LOW (ref 150–400)
Platelets: 115 10*3/uL — ABNORMAL LOW (ref 150–400)
RBC: 2.96 MIL/uL — ABNORMAL LOW (ref 4.22–5.81)
RBC: 3 MIL/uL — ABNORMAL LOW (ref 4.22–5.81)
RBC: 3.04 MIL/uL — ABNORMAL LOW (ref 4.22–5.81)
RBC: 3.06 MIL/uL — ABNORMAL LOW (ref 4.22–5.81)
RDW: 13.6 % (ref 11.5–15.5)
RDW: 13.6 % (ref 11.5–15.5)
RDW: 13.7 % (ref 11.5–15.5)
RDW: 13.8 % (ref 11.5–15.5)
WBC: 10.2 10*3/uL (ref 4.0–10.5)
WBC: 10.7 10*3/uL — ABNORMAL HIGH (ref 4.0–10.5)
WBC: 11.5 10*3/uL — AB (ref 4.0–10.5)
WBC: 12.4 10*3/uL — ABNORMAL HIGH (ref 4.0–10.5)

## 2017-03-07 LAB — BASIC METABOLIC PANEL
Anion gap: 7 (ref 5–15)
BUN: 24 mg/dL — AB (ref 6–20)
CALCIUM: 8 mg/dL — AB (ref 8.9–10.3)
CO2: 21 mmol/L — AB (ref 22–32)
CREATININE: 0.96 mg/dL (ref 0.61–1.24)
Chloride: 111 mmol/L (ref 101–111)
GFR calc non Af Amer: >60 mL/min (ref >60)
Glucose, Bld: 118 mg/dL — ABNORMAL HIGH (ref 65–99)
Potassium: 4.7 mmol/L (ref 3.5–5.1)
SODIUM: 139 mmol/L (ref 135–145)

## 2017-03-07 LAB — GLUCOSE, CAPILLARY
Glucose-Capillary: 107 mg/dL — ABNORMAL HIGH (ref 65–99)
Glucose-Capillary: 111 mg/dL — ABNORMAL HIGH (ref 65–99)
Glucose-Capillary: 122 mg/dL — ABNORMAL HIGH (ref 65–99)
Glucose-Capillary: 141 mg/dL — ABNORMAL HIGH (ref 65–99)
Glucose-Capillary: 150 mg/dL — ABNORMAL HIGH (ref 65–99)
Glucose-Capillary: 95 mg/dL (ref 65–99)

## 2017-03-07 LAB — POCT I-STAT, CHEM 8
BUN: 30 mg/dL — ABNORMAL HIGH (ref 6–20)
Calcium, Ion: 1.22 mmol/L (ref 1.15–1.40)
Chloride: 101 mmol/L (ref 101–111)
Creatinine, Ser: 1 mg/dL (ref 0.61–1.24)
Glucose, Bld: 151 mg/dL — ABNORMAL HIGH (ref 65–99)
HCT: 28 % — ABNORMAL LOW (ref 39.0–52.0)
Hemoglobin: 9.5 g/dL — ABNORMAL LOW (ref 13.0–17.0)
Potassium: 5.5 mmol/L — ABNORMAL HIGH (ref 3.5–5.1)
Sodium: 137 mmol/L (ref 135–145)
TCO2: 25 mmol/L (ref 0–100)

## 2017-03-07 LAB — CK: Total CK: 379 U/L (ref 49–397)

## 2017-03-07 LAB — MAGNESIUM
MAGNESIUM: 1.9 mg/dL (ref 1.7–2.4)
Magnesium: 2 mg/dL (ref 1.7–2.4)
Magnesium: 2.3 mg/dL (ref 1.7–2.4)

## 2017-03-07 MED ORDER — INSULIN ASPART 100 UNIT/ML ~~LOC~~ SOLN
0.0000 [IU] | SUBCUTANEOUS | Status: DC
  Administered 2017-03-07: 2 [IU] via SUBCUTANEOUS

## 2017-03-07 MED ORDER — PROMETHAZINE HCL 25 MG/ML IJ SOLN
12.5000 mg | Freq: Four times a day (QID) | INTRAMUSCULAR | Status: DC | PRN
  Administered 2017-03-07 – 2017-03-09 (×2): 12.5 mg via INTRAVENOUS
  Filled 2017-03-07 (×2): qty 1

## 2017-03-07 MED ORDER — METOCLOPRAMIDE HCL 5 MG/ML IJ SOLN
10.0000 mg | Freq: Four times a day (QID) | INTRAMUSCULAR | Status: DC | PRN
  Administered 2017-03-07: 10 mg via INTRAVENOUS
  Filled 2017-03-07: qty 2

## 2017-03-07 MED ORDER — ENOXAPARIN SODIUM 30 MG/0.3ML ~~LOC~~ SOLN
30.0000 mg | Freq: Every day | SUBCUTANEOUS | Status: DC
  Administered 2017-03-07 – 2017-03-11 (×5): 30 mg via SUBCUTANEOUS
  Filled 2017-03-07 (×5): qty 0.3

## 2017-03-07 MED ORDER — INSULIN ASPART 100 UNIT/ML ~~LOC~~ SOLN
0.0000 [IU] | SUBCUTANEOUS | Status: DC
  Administered 2017-03-07 – 2017-03-08 (×3): 2 [IU] via SUBCUTANEOUS

## 2017-03-07 NOTE — Progress Notes (Signed)
Pt vomitted x2 when getting to the chair this AM. Clear/Water

## 2017-03-07 NOTE — Progress Notes (Signed)
Anesthesiology Follow-up:  Awake and alert, neuro intact, in good spirits. Having some incisional soreness.  VS: T- 37.1 BP- 125/82 RR- 15 HR- 74 (SR) O2 Sat 100% on 2 L [place]  K- 4.7 BUN/Cr. 24/1.10 H/H 9.8/29.9 Platelets- 115,000  Extubated 3 hours post-op.  66 year old male one day S/P AVR and replacement of ascending aorta. Doing well, no apparent complications.

## 2017-03-07 NOTE — Progress Notes (Signed)
Patient ID: James Mayo, male   DOB: 1951/03/19, 66 y.o.   MRN: [id number]  SICU Evening Rounds:  Hemodynamically stable in sinus rhythm  Urine output ok  Sitting up in chair on his computer.

## 2017-03-07 NOTE — Consult Note (Signed)
   [facility] Inpatient Consult   03/07/2017  James Mayo 06-18-1951 [id number]    Patient screened for potential [facility] Care Management/Link to Wellness program for [place] employees/dependents with [employer] insurance. Chart reviewed. Currently in ICU. Will follow up at later time. Discussed with inpatient RNCM.   Marthenia Rolling, MSN-Ed, RN,BSN [facility] Liaison [phone]

## 2017-03-07 NOTE — Progress Notes (Signed)
Patient ID: James Mayo, male   DOB: 18-Dec-1950, 66 y.o.   MRN: [id number] TCTS DAILY ICU PROGRESS NOTE                   [address]            [place] [address]          [phone]   1 Day Post-Op Procedure(s) (LRB): REPLACEMENT ASCENDING AORTA using 49m Hemashield Graft - with Hypothermic Circulatory Arrest (N/A) TRANSESOPHAGEAL ECHOCARDIOGRAM (TEE) (N/A) AORTIC VALVE REPLACEMENT (AVR) using Magna Ease valve Size 283m Total Length of Stay:  LOS: 1 day   Subjective: Extubated awake and alert   Objective: Vital signs in last 24 hours: Temp:  [96.4 F (35.8 C)-99.3 F (37.4 C)] 99.1 F (37.3 C) (06/26 0700) Pulse Rate:  [77-86] 80 (06/26 0700) Cardiac Rhythm: Normal sinus rhythm (06/26 0600) Resp:  [9-29] 11 (06/26 0700) BP: (101-128)/(67-87) 111/70 (06/26 0600) SpO2:  [93 %-100 %] 94 % (06/26 0700) Arterial Line BP: (98-151)/(50-84) 121/55 (06/26 0700) FiO2 (%):  [40 %-50 %] 40 % (06/25 1631) Weight:  [133 lb 2.5 oz (60.4 kg)-142 lb 13.7 oz (64.8 kg)] 142 lb 13.7 oz (64.8 kg) (06/26 0645)  Filed Weights   03/06/17 0616 03/06/17 1500 03/07/17 0645  Weight: 133 lb 6 oz (60.5 kg) 133 lb 2.5 oz (60.4 kg) 142 lb 13.7 oz (64.8 kg)    Weight change: -3.5 oz (-0.098 kg)   Hemodynamic parameters for last 24 hours: PAP: (12-25)/(4-13) 21/7 CO:  [2.4 L/min-5.4 L/min] 5.4 L/min CI:  [1.9 L/min/m2-3.2 L/min/m2] 3.2 L/min/m2  Intake/Output from previous day: 06/25 0701 - 06/26 0700 In: 6741.5 [P.O.:30; I.V.:4431.5; Blood:1000; NG/GT:30; IV Piggyback:1250] Out: 427672Urine:1715; Emesis/NG output:5; Blood:1400; Chest Tube:1150]  Intake/Output this shift: No intake/output data recorded.  Current Meds: Scheduled Meds: . acetaminophen  1,000 mg Oral Q6H   Or  . acetaminophen (TYLENOL) oral liquid 160 mg/5 mL  1,000 mg Per Tube Q6H  . aspirin EC  325 mg Oral Daily   Or  . aspirin  324 mg Per Tube Daily  . bisacodyl  10 mg Oral Daily   Or  . bisacodyl   10 mg Rectal Daily  . chlorhexidine gluconate (MEDLINE KIT)  15 mL Mouth Rinse BID  . docusate sodium  200 mg Oral Daily  . insulin aspart  0-24 Units Subcutaneous Q4H  . levothyroxine  75 mcg Oral QAC breakfast  . mouth rinse  15 mL Mouth Rinse QID  . metoCLOPramide (REGLAN) injection  10 mg Intravenous Q6H  . metoprolol tartrate  12.5 mg Oral BID   Or  . metoprolol tartrate  12.5 mg Per Tube BID  . [START ON 03/08/2017] pantoprazole  40 mg Oral Daily  . sodium chloride flush  3 mL Intravenous Q12H   Continuous Infusions: . sodium chloride 20 mL/hr at 03/07/17 0700  . sodium chloride    . sodium chloride 20 mL/hr at 03/07/17 0700  . albumin human    . cefUROXime (ZINACEF)  IV Stopped (03/06/17 2025)  . dexmedetomidine (PRECEDEX) IV infusion Stopped (03/06/17 1700)  . famotidine (PEPCID) IV Stopped (03/06/17 1504)  . lactated ringers    . lactated ringers 20 mL/hr at 03/07/17 0700  . lactated ringers 20 mL/hr at 03/07/17 0700  . nitroGLYCERIN Stopped (03/06/17 1435)  . phenylephrine (NEO-SYNEPHRINE) Adult infusion Stopped (03/06/17 1955)   PRN Meds:.sodium chloride, albumin human, lactated ringers, metoprolol tartrate, midazolam, morphine injection, ondansetron (ZOFRAN) IV,  oxyCODONE, sodium chloride flush, traMADol  General appearance: alert and cooperative Neurologic: intact Heart: regular rate and rhythm, S1, S2 normal, no murmur, click, rub or gallop Lungs: diminished breath sounds bibasilar Abdomen: soft, non-tender; bowel sounds normal; no masses,  no organomegaly Extremities: extremities normal, atraumatic, no cyanosis or edema and Homans sign is negative, no sign of DVT Wound: sternum stable  Lab Results: CBC: Recent Labs  03/06/17 2345 03/07/17 0330  WBC 10.2 10.7*  HGB 10.2* 9.8*  HCT 30.3* 29.4*  PLT 107* 115*   BMET:  Recent Labs  03/06/17 1308 03/06/17 1426 03/07/17 0330  NA 141 146* 139  K 3.8 3.2* 4.7  CL 102  --  111  CO2  --   --  21*    GLUCOSE 153* 44* 118*  BUN 27*  --  24*  CREATININE 0.50*  --  0.96  CALCIUM  --   --  8.0*    CMET: Lab Results  Component Value Date   WBC 10.7 (H) 03/07/2017   HGB 9.8 (L) 03/07/2017   HCT 29.4 (L) 03/07/2017   PLT 115 (L) 03/07/2017   GLUCOSE 118 (H) 03/07/2017   CHOL 195 08/25/2016   TRIG 44 08/25/2016   HDL 101 08/25/2016   LDLCALC 85 08/25/2016   ALT 22 03/02/2017   AST 29 03/02/2017   NA 139 03/07/2017   K 4.7 03/07/2017   CL 111 03/07/2017   CREATININE 0.96 03/07/2017   BUN 24 (H) 03/07/2017   CO2 21 (L) 03/07/2017   TSH 5.870 (H) 11/22/2016   INR 1.48 03/06/2017   HGBA1C 5.5 03/02/2017      PT/INR:  Recent Labs  03/06/17 1428  LABPROT 18.0*  INR 1.48   Radiology: Dg Chest Port 1 View  Result Date: 03/07/2017 CLINICAL DATA:  Status post AVR EXAM: PORTABLE CHEST 1 VIEW COMPARISON:  03/06/2017 FINDINGS: Mediastinal drain, pericardial drain and Swan-Ganz catheter remain in place. The endotracheal tube and nasogastric catheter have been removed in the interval. Cardiac shadow is stable. Mild left basilar atelectasis is noted increased from the prior exam. No pneumothorax is seen. Old right rib fractures are again seen. IMPRESSION: Mild increase in left basilar atelectasis Tubes and lines as described. Electronically Signed   By: Inez Catalina M.D.   On: 03/07/2017 07:21   Dg Chest Port 1 View  Result Date: 03/06/2017 CLINICAL DATA:  Post aortic valve replacement with aortic replacement. EXAM: PORTABLE CHEST 1 VIEW COMPARISON:  03/02/2017 FINDINGS: Endotracheal tube is appropriately positioned above the carina. Nasogastric tube extends into the abdomen. There is a prosthetic aortic valve. Swan-Ganz catheter in the main pulmonary artery region. Densities at the left lung base are most compatible with volume loss. Midline chest tubes are present. Negative for pulmonary edema. Negative for pneumothorax. New median sternotomy wires with old right rib fractures.  IMPRESSION: Postsurgical changes with support apparatuses as described. Negative for pneumothorax. Volume loss at the left lung base. Electronically Signed   By: Markus Daft M.D.   On: 03/06/2017 14:35     Assessment/Plan: S/P Procedure(s) (LRB): REPLACEMENT ASCENDING AORTA using 83m Hemashield Graft - with Hypothermic Circulatory Arrest (N/A) TRANSESOPHAGEAL ECHOCARDIOGRAM (TEE) (N/A) AORTIC VALVE REPLACEMENT (AVR) using Magna Ease valve Size 271mMobilize Diuresis d/c tubes/lines Expected Acute  Blood - loss Anemia    EdGrace Isaac/26/2018 7:48 AM

## 2017-03-07 NOTE — Care Management Note (Signed)
Case Management Note Marvetta Gibbons RN, BSN Unit 2W-Case Manager-- [place] coverage [phone]  Patient Details  Name: James Mayo MRN: [id number] Date of Birth: 1951/01/14  Subjective/Objective:    Pt admitted s/p AVR on 03/06/17                Action/Plan: PTA pt lived at home with spouse- anticipate return home- CM to follow  Expected Discharge Date:                  Expected Discharge Plan:  Home/Self Care  In-House Referral:     Discharge planning Services  CM Consult  Post Acute Care Choice:    Choice offered to:     DME Arranged:    DME Agency:     HH Arranged:    [place] Agency:     Status of Service:  In process, will continue to follow  If discussed at Long Length of Stay Meetings, dates discussed:    Discharge Disposition:   Additional Comments:  Dawayne Patricia, RN 03/07/2017, 10:58 AM

## 2017-03-08 ENCOUNTER — Inpatient Hospital Stay (HOSPITAL_COMMUNITY): Payer: 59

## 2017-03-08 LAB — CBC
HCT: 25.8 % — ABNORMAL LOW (ref 39.0–52.0)
Hemoglobin: 8.4 g/dL — ABNORMAL LOW (ref 13.0–17.0)
MCH: 32.8 pg (ref 26.0–34.0)
MCHC: 32.6 g/dL (ref 30.0–36.0)
MCV: 100.8 fL — ABNORMAL HIGH (ref 78.0–100.0)
Platelets: 85 10*3/uL — ABNORMAL LOW (ref 150–400)
RBC: 2.56 MIL/uL — ABNORMAL LOW (ref 4.22–5.81)
RDW: 14 % (ref 11.5–15.5)
WBC: 9.8 10*3/uL (ref 4.0–10.5)

## 2017-03-08 LAB — GLUCOSE, CAPILLARY
Glucose-Capillary: 143 mg/dL — ABNORMAL HIGH (ref 65–99)
Glucose-Capillary: 94 mg/dL (ref 65–99)
Glucose-Capillary: 96 mg/dL (ref 65–99)

## 2017-03-08 LAB — BASIC METABOLIC PANEL
Anion gap: 4 — ABNORMAL LOW (ref 5–15)
BUN: 30 mg/dL — ABNORMAL HIGH (ref 6–20)
CO2: 28 mmol/L (ref 22–32)
Calcium: 8.4 mg/dL — ABNORMAL LOW (ref 8.9–10.3)
Chloride: 104 mmol/L (ref 101–111)
Creatinine, Ser: 1.02 mg/dL (ref 0.61–1.24)
GFR calc Af Amer: >60 mL/min (ref >60)
GFR calc non Af Amer: >60 mL/min (ref >60)
Glucose, Bld: 102 mg/dL — ABNORMAL HIGH (ref 65–99)
Potassium: 4.6 mmol/L (ref 3.5–5.1)
Sodium: 136 mmol/L (ref 135–145)

## 2017-03-08 MED ORDER — LISINOPRIL 5 MG PO TABS
5.0000 mg | ORAL_TABLET | Freq: Every day | ORAL | Status: DC
  Administered 2017-03-08 – 2017-03-10 (×3): 5 mg via ORAL
  Filled 2017-03-08 (×3): qty 1

## 2017-03-08 MED ORDER — SODIUM CHLORIDE 0.9% FLUSH: 3.0000 mL | INTRAVENOUS | Status: DC | PRN

## 2017-03-08 MED ORDER — METOCLOPRAMIDE HCL 5 MG/ML IJ SOLN
10.0000 mg | Freq: Four times a day (QID) | INTRAMUSCULAR | Status: DC | PRN
  Administered 2017-03-08: 10 mg via INTRAVENOUS
  Filled 2017-03-08: qty 2

## 2017-03-08 MED ORDER — MOVING RIGHT ALONG BOOK
Freq: Once | Status: AC
  Administered 2017-03-08: 1
  Filled 2017-03-08: qty 1

## 2017-03-08 MED ORDER — SODIUM CHLORIDE 0.9% FLUSH
3.0000 mL | Freq: Two times a day (BID) | INTRAVENOUS | Status: DC
  Administered 2017-03-08 – 2017-03-12 (×9): 3 mL via INTRAVENOUS

## 2017-03-08 MED ORDER — SODIUM CHLORIDE 0.9 % IV SOLN: 250.0000 mL | INTRAVENOUS | Status: DC | PRN

## 2017-03-08 NOTE — Progress Notes (Signed)
CARDIAC REHAB PHASE I   PRE:  Rate/Rhythm: 72 SR    BP: sitting 112/70    SaO2: 91-92 RA  MODE:  Ambulation: 500 ft   POST:  Rate/Rhythm: 83 SR    BP: sitting 114/70     SaO2: 88 RA, up to 90 RA with pursed lip breathing  Pt c/o nausea but wanting to walk. Needed reminders for sternal precautions. Seems foggy in general. Steady with RW, no major c/o. Return to recliner and practiced IS, 750 mL. Pt to go to sleep, exhausted. [place], ACSM 03/08/2017 1:36 PM

## 2017-03-08 NOTE — Progress Notes (Signed)
TCTS DAILY ICU PROGRESS NOTE                   [address]            [place] [address]          [phone]   2 Days Post-Op Procedure(s) (LRB): REPLACEMENT ASCENDING AORTA using 19m Hemashield Graft - with Hypothermic Circulatory Arrest (N/A) TRANSESOPHAGEAL ECHOCARDIOGRAM (TEE) (N/A) AORTIC VALVE REPLACEMENT (AVR) using Magna Ease valve Size 267m Total Length of Stay:  LOS: 2 days   Subjective:  Biggest complaint is mild nausea.  States it is occurring intermittently relieved with medications.  Denies vomiting.  He also denies abdominal pain.  Has not yet moved his bowels and is not passing much gas.  Objective: Vital signs in last 24 hours: Temp:  [98.2 F (36.8 C)-99.2 F (37.3 C)] 98.2 F (36.8 C) (06/27 0400) Pulse Rate:  [63-83] 65 (06/27 0700) Cardiac Rhythm: Normal sinus rhythm (06/27 0400) Resp:  [11-27] 20 (06/27 0700) BP: (102-149)/(72-105) 120/74 (06/27 0700) SpO2:  [92 %-100 %] 100 % (06/27 0700) Arterial Line BP: (120)/(53) 120/53 (06/26 0800) Weight:  [143 lb 4.8 oz (65 kg)] 143 lb 4.8 oz (65 kg) (06/27 0500)  Filed Weights   03/06/17 1500 03/07/17 0645 03/08/17 0500  Weight: 133 lb 2.5 oz (60.4 kg) 142 lb 13.7 oz (64.8 kg) 143 lb 4.8 oz (65 kg)    Weight change: 10 lb 2.3 oz (4.6 kg)   Hemodynamic parameters for last 24 hours: PAP: (17)/(4) 17/4  Intake/Output from previous day: 06/26 0701 - 06/27 0700 In: 990 [P.O.:240; I.V.:600; IV Piggyback:150] Out: 680 [Urine:630; Chest Tube:50]  Current Meds: Scheduled Meds: . acetaminophen  1,000 mg Oral Q6H   Or  . acetaminophen (TYLENOL) oral liquid 160 mg/5 mL  1,000 mg Per Tube Q6H  . aspirin EC  325 mg Oral Daily   Or  . aspirin  324 mg Per Tube Daily  . bisacodyl  10 mg Oral Daily   Or  . bisacodyl  10 mg Rectal Daily  . chlorhexidine gluconate (MEDLINE KIT)  15 mL Mouth Rinse BID  . docusate sodium  200 mg Oral Daily  . enoxaparin (LOVENOX) injection  30 mg Subcutaneous  QHS  . levothyroxine  75 mcg Oral QAC breakfast  . lisinopril  5 mg Oral Daily  . mouth rinse  15 mL Mouth Rinse QID  . metoprolol tartrate  12.5 mg Oral BID   Or  . metoprolol tartrate  12.5 mg Per Tube BID  . moving right along book   Does not apply Once  . pantoprazole  40 mg Oral Daily  . sodium chloride flush  3 mL Intravenous Q12H   Continuous Infusions: . sodium chloride 20 mL/hr at 03/07/17 2000  . sodium chloride Stopped (03/07/17 098182 . sodium chloride Stopped (03/07/17 0900)  . sodium chloride    . cefUROXime (ZINACEF)  IV Stopped (03/07/17 2056)  . lactated ringers Stopped (03/07/17 0900)  . lactated ringers Stopped (03/07/17 0900)   PRN Meds:.sodium chloride, sodium chloride, metoCLOPramide (REGLAN) injection, metoprolol tartrate, ondansetron (ZOFRAN) IV, oxyCODONE, promethazine, sodium chloride flush, traMADol  General appearance: alert, cooperative and no distress Heart: regular rate and rhythm Lungs: diminished breath sounds left base Abdomen: soft, non-tender; bowel sounds normal; no masses,  no organomegaly Extremities: edema minimal Wound: clean and dry  Lab Results: CBC: Recent Labs  03/07/17 1650 03/07/17 1656 03/08/17 0400  WBC 11.5*  --  9.8  HGB 9.6* 9.5* 8.4*  HCT 29.4* 28.0* 25.8*  PLT 110*  --  85*   BMET:  Recent Labs  03/07/17 0330  03/07/17 1656 03/08/17 0400  NA 139  --  137 136  K 4.7  --  5.5* 4.6  CL 111  --  101 104  CO2 21*  --   --  28  GLUCOSE 118*  --  151* 102*  BUN 24*  --  30* 30*  CREATININE 0.96  < > 1.00 1.02  CALCIUM 8.0*  --   --  8.4*  < > = values in this interval not displayed.  CMET: Lab Results  Component Value Date   WBC 9.8 03/08/2017   HGB 8.4 (L) 03/08/2017   HCT 25.8 (L) 03/08/2017   PLT 85 (L) 03/08/2017   GLUCOSE 102 (H) 03/08/2017   CHOL 195 08/25/2016   TRIG 44 08/25/2016   HDL 101 08/25/2016   LDLCALC 85 08/25/2016   ALT 22 03/02/2017   AST 29 03/02/2017   NA 136 03/08/2017   K 4.6  03/08/2017   CL 104 03/08/2017   CREATININE 1.02 03/08/2017   BUN 30 (H) 03/08/2017   CO2 28 03/08/2017   TSH 5.870 (H) 11/22/2016   INR 1.48 03/06/2017   HGBA1C 5.5 03/02/2017      PT/INR:  Recent Labs  03/06/17 1428  LABPROT 18.0*  INR 1.48   Radiology: Dg Chest Port 1 View  Result Date: 03/08/2017 CLINICAL DATA:  Status post aortic valve replacement 2 days ago. EXAM: PORTABLE CHEST 1 VIEW COMPARISON:  Portable chest x-ray of March 07, 2017 FINDINGS: The lungs are adequately inflated. There is persistent left basilar density with obscuration of the hemidiaphragm. The right lung base is clear. The cardiac silhouette is enlarged. There is a prosthetic aortic valve ring visible. The pulmonary vascularity is not engorged. The Swan-Ganz catheter is been removed. The right internal jugular Cordis sheath remains. The sternal wires are intact. IMPRESSION: Persistent left lower lobe atelectasis or pneumonia. No pulmonary edema. Electronically Signed   By: David  Martinique M.D.   On: 03/08/2017 07:40     Assessment/Plan: S/P Procedure(s) (LRB): REPLACEMENT ASCENDING AORTA using 41m Hemashield Graft - with Hypothermic Circulatory Arrest (N/A) TRANSESOPHAGEAL ECHOCARDIOGRAM (TEE) (N/A) AORTIC VALVE REPLACEMENT (AVR) using Magna Ease valve Size 265m 1. CV- NSR, mildly Hypertensive with SBP in the 140s at times- continue Lopressor, will start low dose Lisinopril 2. Pulm- wean oxygen as tolerated, CXR with a LLL atelectasis, continue IS 3. Renal- creatinine stable at 1.02, weight is above basline, no edema on exam... No Lasix for now 4. Expected post operative blood loss anemia- Hgb down to 8.4, no acute signs of bleeding, monitor 5. GI- nausea, continue anti-emetics... Patients abdominal exam benign, will watch for signs of Ileus 6. CBGs controlled, patient is not a diabetic will d/c SSIP 7. Dispo- patient stable, mild nausea continue anti-emetics and monitor, no lasix currently... [email or web address] 03/08/2017 7:59 AM
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
## 2017-03-08 NOTE — Progress Notes (Signed)
Pt transferred to [facility]13 via wheelchair, SWOT RN transferring patient, patient's wife aware of transfer, all patient belongings at side, vss, report given to [place], [place] on [facility].    Kathleen Argue S 11:49 AM

## 2017-03-08 NOTE — Consult Note (Addendum)
   [facility] CM Inpatient Consult   03/08/2017  MANLY NESTLE 17-Apr-1951 [id number]   Came to visit Mr. Vidales after transferring to [place] with wife who is a Furniture conservator/restorer. Mr. Isidro was resting.  Declined Link to [employer] and 24-hr nurse line magnet. Wife states she is familiar with the program and Mr. Colter does not have any Link to Wellness needs. Discussed that Mr. Friedt will receive post hospital discharge call.   Marthenia Rolling, MSN-Ed, RN,BSN [facility] Liaison [phone]

## 2017-03-09 ENCOUNTER — Inpatient Hospital Stay (HOSPITAL_COMMUNITY): Payer: 59

## 2017-03-09 DIAGNOSIS — I480 Paroxysmal atrial fibrillation: Secondary | ICD-10-CM

## 2017-03-09 HISTORY — DX: Paroxysmal atrial fibrillation: I48.0

## 2017-03-09 LAB — CBC
HEMATOCRIT: 25.3 % — AB (ref 39.0–52.0)
HEMOGLOBIN: 8.3 g/dL — AB (ref 13.0–17.0)
MCH: 32.8 pg (ref 26.0–34.0)
MCHC: 32.8 g/dL (ref 30.0–36.0)
MCV: 100 fL (ref 78.0–100.0)
Platelets: 100 10*3/uL — ABNORMAL LOW (ref 150–400)
RBC: 2.53 MIL/uL — AB (ref 4.22–5.81)
RDW: 13.7 % (ref 11.5–15.5)
WBC: 9 10*3/uL (ref 4.0–10.5)

## 2017-03-09 LAB — BASIC METABOLIC PANEL
ANION GAP: 9 (ref 5–15)
BUN: 28 mg/dL — ABNORMAL HIGH (ref 6–20)
CHLORIDE: 97 mmol/L — AB (ref 101–111)
CO2: 26 mmol/L (ref 22–32)
Calcium: 8.3 mg/dL — ABNORMAL LOW (ref 8.9–10.3)
Creatinine, Ser: 0.95 mg/dL (ref 0.61–1.24)
GFR calc non Af Amer: >60 mL/min (ref >60)
Glucose, Bld: 112 mg/dL — ABNORMAL HIGH (ref 65–99)
Potassium: 4.4 mmol/L (ref 3.5–5.1)
SODIUM: 132 mmol/L — AB (ref 135–145)

## 2017-03-09 MED ORDER — POTASSIUM CHLORIDE CRYS ER 20 MEQ PO TBCR
20.0000 meq | EXTENDED_RELEASE_TABLET | Freq: Every day | ORAL | Status: DC
  Administered 2017-03-09 – 2017-03-12 (×4): 20 meq via ORAL
  Filled 2017-03-09 (×4): qty 1

## 2017-03-09 MED ORDER — FUROSEMIDE 40 MG PO TABS
40.0000 mg | ORAL_TABLET | Freq: Every day | ORAL | Status: DC
  Administered 2017-03-09 – 2017-03-12 (×4): 40 mg via ORAL
  Filled 2017-03-09 (×4): qty 1
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
## 2017-03-09 NOTE — Progress Notes (Signed)
CCMD just called to inform RN that Pt is in Afib. They say that Pt has been in Afib since 1330!  There have been no tele alarms as HR is controlled, and Pt has had no complaints.  In fact Pt has ambulated independently this afternoon.  Will make TCTS aware.

## 2017-03-09 NOTE — Progress Notes (Addendum)
      [address]       [place] [address]             [phone]      3 Days Post-Op Procedure(s) (LRB): REPLACEMENT ASCENDING AORTA using 6mm Hemashield Graft - with Hypothermic Circulatory Arrest (N/A) TRANSESOPHAGEAL ECHOCARDIOGRAM (TEE) (N/A) AORTIC VALVE REPLACEMENT (AVR) using Magna Ease valve Size 44mm   Subjective:  Patient is feeling better than he was yesterday.  He continues to have some mild nausea, but has been able to eat a little bit.  + ambulation  No BM yet  Objective: Vital signs in last 24 hours: Temp:  [98.8 F (37.1 C)-99.2 F (37.3 C)] 98.8 F (37.1 C) (06/28 0500) Pulse Rate:  [25-73] 62 (06/28 0500) Cardiac Rhythm: Normal sinus rhythm (06/28 0700) Resp:  [11-20] 16 (06/28 0500) BP: (99-134)/(60-82) 99/68 (06/28 0500) SpO2:  [93 %-97 %] 94 % (06/28 0500) Weight:  [143 lb 4.8 oz (65 kg)] 143 lb 4.8 oz (65 kg) (06/28 0500)  Intake/Output from previous day: 06/27 0701 - 06/28 0700 In: 370 [P.O.:240; I.V.:80; IV Piggyback:50] Out: 50 [Urine:50]  General appearance: alert, cooperative and no distress Heart: regular rate and rhythm Lungs: clear to auscultation bilaterally Abdomen: soft, non-tender; bowel sounds normal; no masses,  no organomegaly Extremities: trace edema Wound: clean and dry  Lab Results:  Recent Labs  03/08/17 0400 03/09/17 0317  WBC 9.8 9.0  HGB 8.4* 8.3*  HCT 25.8* 25.3*  PLT 85* 100*   BMET:  Recent Labs  03/08/17 0400 03/09/17 0317  NA 136 132*  K 4.6 4.4  CL 104 97*  CO2 28 26  GLUCOSE 102* 112*  BUN 30* 28*  CREATININE 1.02 0.95  CALCIUM 8.4* 8.3*    waPT/INR:  Recent Labs  03/06/17 1428  LABPROT 18.0*  INR 1.48   ABG    Component Value Date/Time   PHART 7.358 03/06/2017 1832   HCO3 19.0 (L) 03/06/2017 1832   TCO2 25 03/07/2017 1656   ACIDBASEDEF 6.0 (H) 03/06/2017 1832   O2SAT 96.0 03/06/2017 1832   CBG (last 3)   Recent Labs  03/07/17 2359 03/08/17 0355  03/08/17 0821  GLUCAP 143* 94 96    Assessment/Plan: S/P Procedure(s) (LRB): REPLACEMENT ASCENDING AORTA using 36mm Hemashield Graft - with Hypothermic Circulatory Arrest (N/A) TRANSESOPHAGEAL ECHOCARDIOGRAM (TEE) (N/A) AORTIC VALVE REPLACEMENT (AVR) using Magna Ease valve Size 39mm  1. CV- hemodynamically stable, mild bradycardia, BP improved- continue Lopressor, ACE 2. Pulm- no acute issues, CXR with small bilateral effusions/atelectasis, continue IS 3. Renal- creatinine at 0.95, weight is elevated 10 lbs above baseline, will start oral Lasix for 3 doses 4. Expected post operative blood loss anemia- Hgb at 8.3 5. GI- nausea improved, continue anti-emetics, no BM yet, abdominal exam is benign 6. Dispo- patient stable, hemodynamically stable, start lasix for 3 days, likely ready for d/c in 24-48 hrs   LOS: 3 days    Ellwood Handler 03/09/2017   Feels better today walked in hall 4 times today, nausea much improved  Poss home one to 2 days  I have seen and examined James Mayo and agree with the above assessment  and plan.  Grace Isaac MD Beeper [phone] Office [phone] 03/09/2017 6:27 PM

## 2017-03-09 NOTE — Progress Notes (Signed)
CARDIAC REHAB PHASE I   PRE:  Rate/Rhythm: 58 SB  BP:  Sitting: 120/69        SaO2: 100 RA  MODE:  Ambulation: 890 ft   POST:  Rate/Rhythm: 62 SR  BP:  Sitting: 120/73         SaO2: 96 RA  Pt ambulated 890 ft on RA, rolling walker, standby assist, steady gait, tolerated well with no complaints. Upon return to room, some drainage noted what appears to be from chest tube incisions, RN notified.  Second walk for pt today, encouraged additional ambulation x1. Pt to recliner after walk, call bell within reach.Will follow.    [phone] Lenna Sciara, RN, BSN 03/09/2017 10:46 AM

## 2017-03-09 NOTE — Progress Notes (Signed)
EPWs removed per order and unit protocol.  Pt tolerated very well.  Sites unremarkable, all tips intact.  Pt and wife aware of bedrest for 1 hr with frequent VS.  CCMD notified, will monitor closely.

## 2017-03-10 ENCOUNTER — Encounter (HOSPITAL_COMMUNITY): Payer: Self-pay | Admitting: Physician Assistant

## 2017-03-10 DIAGNOSIS — Z95828 Presence of other vascular implants and grafts: Secondary | ICD-10-CM

## 2017-03-10 DIAGNOSIS — I480 Paroxysmal atrial fibrillation: Secondary | ICD-10-CM

## 2017-03-10 LAB — BPAM RBC
Blood Product Expiration Date: 201807032359
Blood Product Expiration Date: 201807032359
ISSUE DATE / TIME: 201806250842
ISSUE DATE / TIME: 201806250842
Unit Type and Rh: 6200
Unit Type and Rh: 6200

## 2017-03-10 LAB — TYPE AND SCREEN
ABO/RH(D): A POS
Antibody Screen: NEGATIVE
Unit division: 0
Unit division: 0

## 2017-03-10 MED ORDER — LACTULOSE 10 GM/15ML PO SOLN
20.0000 g | Freq: Once | ORAL | Status: AC
  Administered 2017-03-10: 20 g via ORAL
  Filled 2017-03-10: qty 30

## 2017-03-10 MED ORDER — WARFARIN SODIUM 3 MG PO TABS
6.0000 mg | ORAL_TABLET | Freq: Once | ORAL | Status: AC
  Administered 2017-03-10: 6 mg via ORAL
  Filled 2017-03-10: qty 2

## 2017-03-10 MED ORDER — AMIODARONE HCL 200 MG PO TABS: 400.0000 mg | ORAL_TABLET | Freq: Two times a day (BID) | ORAL | Status: DC

## 2017-03-10 MED ORDER — WARFARIN VIDEO
Freq: Once | Status: AC
  Administered 2017-03-10: 18:00:00

## 2017-03-10 MED ORDER — METOPROLOL TARTRATE 25 MG PO TABS
25.0000 mg | ORAL_TABLET | Freq: Two times a day (BID) | ORAL | Status: DC
  Administered 2017-03-10 – 2017-03-12 (×4): 25 mg via ORAL
  Filled 2017-03-10 (×4): qty 1

## 2017-03-10 MED ORDER — COUMADIN BOOK
Freq: Once | Status: AC
  Administered 2017-03-10: 18:00:00
  Filled 2017-03-10: qty 1

## 2017-03-10 MED ORDER — WARFARIN - PHARMACIST DOSING INPATIENT
Freq: Every day | Status: DC
  Administered 2017-03-11: 18:00:00

## 2017-03-10 MED ORDER — AMIODARONE HCL 200 MG PO TABS: 200.0000 mg | ORAL_TABLET | Freq: Every day | ORAL | Status: DC

## 2017-03-10 NOTE — Progress Notes (Signed)
ANTICOAGULATION CONSULT NOTE - Initial Consult  Pharmacy Consult for warfarin Indication: atrial fibrillation  Allergies  Allergen Reactions  . No Known Allergies     Patient Measurements: Height: 5\' 7"  (170.2 cm) Weight: 140 lb 9.6 oz (63.8 kg) IBW/kg (Calculated) : 66.1  Vital Signs: Temp: 98.5 F (36.9 C) (06/29 1429) Temp Source: Oral (06/29 1429) BP: 97/64 (06/29 1429) Pulse Rate: 71 (06/29 1429)  Labs:  Recent Labs  03/07/17 1650 03/07/17 1656 03/08/17 0400 03/09/17 0317  HGB 9.6* 9.5* 8.4* 8.3*  HCT 29.4* 28.0* 25.8* 25.3*  PLT 110*  --  85* 100*  CREATININE 1.12 1.00 1.02 0.95    Estimated Creatinine Clearance: 69 mL/min (by C-G formula based on SCr of 0.95 mg/dL).   Medical History: Past Medical History:  Diagnosis Date  . Aortic valve disorder 03/06/2017    S/p AVR with [facility] pericardial tissue valve and replacement of the ascending aorta with a 32 mm Hemashield Side Arm Graft  . BASAL CELL CARCINOMA, FACE 08/19/2009   Qualifier: Diagnosis of By: Oneida Alar MD, KARL    . BASAL CELL CARCINOMA, FACE 08/19/2009   Qualifier: Diagnosis of  By: Oneida Alar MD, KARL    . Coronary artery disease   . Metatarsalgia of left foot 04/29/2014  . Neck pain, bilateral 11/08/2013  . PAF (paroxysmal atrial fibrillation) ([facility]) 03/09/2017  . RHINITIS, ALLERGIC 11/09/2006   Qualifier: Diagnosis of  By: Samara Snide    . SHOULDER PAIN, RIGHT 05/06/2009   Qualifier: Diagnosis of  By: Oneida Alar MD, KARL    . Thoracic ascending aortic aneurysm ([facility])   . Thyroid condition     Medications:  Prescriptions Prior to Admission  Medication Sig Dispense Refill Last Dose  . acetaminophen (TYLENOL) 500 MG tablet Take 1,000 mg by mouth daily as needed for mild pain.   03/05/2017 at Unknown time  . levothyroxine (SYNTHROID) 75 MCG tablet Take 1 tablet (75 mcg total) by mouth daily before breakfast. Please come in for lab draw only in June 18. 90 tablet 1 03/05/2017 at Unknown time  . Menthol,  Topical Analgesic, (BIOFREEZE EX) Apply 1 application topically daily as needed (pain).   Taking  . Menthol, Topical Analgesic, (ICY HOT EX) Apply 1 application topically 2 (two) times daily as needed (pain).    03/05/2017 at Unknown time    Assessment: 66 y/o male s/p pericardial tissue AVR with post-op Afib. Pharmacy consulted to begin warfarin. INR was subtherapeutic at 1.48 on 6/25. No bleeding noted, Hgb low post-op at 8.3, platelets are low at 100. Also on Lovenox for VTE prophylaxis.  Goal of Therapy:  INR 2-3 Monitor platelets by anticoagulation protocol: Yes   Plan:  - Warfarin 6 mg PO tonight - F/U discontinue Lovenox when INR >= 2 - Daily INR - Monitor for s/sx of bleeding - Warfarin book and video - Educate prior to discharge   Renold Genta, PharmD, BCPS Clinical Pharmacist Phone for tonight - [place] - [phone] 03/10/2017 4:08 PM

## 2017-03-10 NOTE — Progress Notes (Signed)
Patient walked this morning about 300 ft. Tolerated well.

## 2017-03-10 NOTE — Progress Notes (Addendum)
      [address]       [place] [address]             [phone]        4 Days Post-Op Procedure(s) (LRB): REPLACEMENT ASCENDING AORTA using 13mm Hemashield Graft - with Hypothermic Circulatory Arrest (N/A) TRANSESOPHAGEAL ECHOCARDIOGRAM (TEE) (N/A) AORTIC VALVE REPLACEMENT (AVR) using Magna Ease valve Size 17mm  Subjective: Patient had small bowel movement. He wants to go home.  Objective: Vital signs in last 24 hours: Temp:  [98.6 F (37 C)-98.7 F (37.1 C)] 98.6 F (37 C) (06/29 0443) Pulse Rate:  [64-96] 95 (06/29 0443) Cardiac Rhythm: Atrial fibrillation (06/29 0700) Resp:  [18] 18 (06/29 0443) BP: (92-117)/(57-77) 97/74 (06/29 0443) SpO2:  [94 %-100 %] 94 % (06/29 0443) Weight:  [63.8 kg (140 lb 9.6 oz)] 63.8 kg (140 lb 9.6 oz) (06/29 0443)  Pre op weight 60.5 kg Current Weight  03/10/17 63.8 kg (140 lb 9.6 oz)      Intake/Output from previous day: 06/28 0701 - 06/29 0700 In: 720 [P.O.:720] Out: 200 [Urine:200]   Physical Exam:  Cardiovascular: IRRR IRRR Pulmonary: Slightly diminished at bases Abdomen: Soft, non tender, bowel sounds present. Extremities: Mild bilateral lower extremity edema. Wounds: Clean and dry.  No erythema or signs of infection.  Lab Results: CBC: Recent Labs  03/08/17 0400 03/09/17 0317  WBC 9.8 9.0  HGB 8.4* 8.3*  HCT 25.8* 25.3*  PLT 85* 100*   BMET:  Recent Labs  03/08/17 0400 03/09/17 0317  NA 136 132*  K 4.6 4.4  CL 104 97*  CO2 28 26  GLUCOSE 102* 112*  BUN 30* 28*  CREATININE 1.02 0.95  CALCIUM 8.4* 8.3*    PT/INR:  Lab Results  Component Value Date   INR 1.48 03/06/2017   INR 0.99 03/02/2017   INR 1.0 01/18/2017   ABG:  INR: Will add last result for INR, ABG once components are confirmed Will add last 4 CBG results once components are confirmed  Assessment/Plan:  1. CV - First degree heart block, a fib. HR went into the 120's over night but this am he is a fib in the low  100's. On Lopressor 12.5 mg bid and Lisinopril 5 mg daily. Would like to increase Lopressor but BP labile. Will ask cardiology to evaluate. 2.  Pulmonary - On room air. Encourage incentive spirometer. 3. Volume Overload - On Lasix 40 mg daily. 4.  Acute blood loss anemia - Last H and H stable at 8.3 and 25.3 5. Thrombocytopenia-platelets 100,000 6. Per patient request, another laxative 7. Hopefully, home over weekend  ZIMMERMAN,DONIELLE MPA-C 03/10/2017,8:14 AM   Now in afib , cardiology  seeing. Patient baseline HR is slow 40's prior to surgery, not leaving much room to push beta blockers  Start coumadin if does not convert to sinus  I have seen and examined James Mayo and agree with the above assessment  and plan.  Grace Isaac MD [facility] [phone] Office [phone] 03/10/2017 6:17 PM

## 2017-03-10 NOTE — Progress Notes (Signed)
I spoke with Dr. Oval Linsey regarding James Mayo a fib. Per Dr. Servando Snare, ok to start Coumadin but no Heparin drip. Also, patient's resting heart rate is in 40's. Ok to increase Lopressor but carefully monitor. Because of post op nausea and lower heart rate, would avoid Amiodarone for now. Would likely benefit from cardioversion.

## 2017-03-10 NOTE — Progress Notes (Signed)
CARDIAC REHAB PHASE I   PRE:  Rate/Rhythm: 110 afib    BP: sitting 97/54    SaO2:   MODE:  Ambulation: 1000 ft   POST:  Rate/Rhythm: 115 afib    BP: sitting 100/74     SaO2: 99 RA  Pt walking well, no RW. No c/o SOB, dizziness. Rate maintaining 110s afib while walking. Ed completed with pt and wife. Will send referral to [facility]. Gave videos to watch. [place], [place] 03/10/2017 2:44 PM
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
## 2017-03-11 DIAGNOSIS — Z952 Presence of prosthetic heart valve: Secondary | ICD-10-CM

## 2017-03-11 LAB — PROTIME-INR
INR: 1.15
Prothrombin Time: 14.8 seconds (ref 11.4–15.2)

## 2017-03-11 MED ORDER — PROMETHAZINE HCL 25 MG PO TABS: 12.5000 mg | ORAL_TABLET | Freq: Every evening | ORAL | Status: DC | PRN

## 2017-03-11 MED ORDER — WARFARIN SODIUM 3 MG PO TABS
6.0000 mg | ORAL_TABLET | Freq: Once | ORAL | Status: AC
  Administered 2017-03-11: 6 mg via ORAL
  Filled 2017-03-11: qty 2

## 2017-03-11 NOTE — Progress Notes (Addendum)
Pt walking independently this am. Looks good. HR 115 afib. Will not see today as he will walk independently. Yves Dill CES, ACSM 8:35 AM 03/11/2017

## 2017-03-11 NOTE — Progress Notes (Signed)
Progress Note  Patient Name: James Mayo Date of Encounter: 03/11/2017  Primary Cardiologist: Irish Lack  Subjective   66 yo  S/p AVR Has post op AF  Has been started on coumadin  Eating and ambulating well HR is well controlled.   Inpatient Medications    Scheduled Meds: . acetaminophen  1,000 mg Oral Q6H   Or  . acetaminophen (TYLENOL) oral liquid 160 mg/5 mL  1,000 mg Per Tube Q6H  . aspirin EC  325 mg Oral Daily   Or  . aspirin  324 mg Per Tube Daily  . bisacodyl  10 mg Oral Daily   Or  . bisacodyl  10 mg Rectal Daily  . chlorhexidine gluconate (MEDLINE KIT)  15 mL Mouth Rinse BID  . docusate sodium  200 mg Oral Daily  . enoxaparin (LOVENOX) injection  30 mg Subcutaneous QHS  . furosemide  40 mg Oral Daily  . levothyroxine  75 mcg Oral QAC breakfast  . mouth rinse  15 mL Mouth Rinse QID  . metoprolol tartrate  25 mg Oral BID  . pantoprazole  40 mg Oral Daily  . potassium chloride  20 mEq Oral Daily  . sodium chloride flush  3 mL Intravenous Q12H  . warfarin  6 mg Oral ONCE-1800  . Warfarin - Pharmacist Dosing Inpatient   Does not apply q1800   Continuous Infusions: . sodium chloride Stopped (03/08/17 1110)  . sodium chloride Stopped (03/07/17 6967)  . sodium chloride Stopped (03/07/17 0900)  . sodium chloride    . lactated ringers Stopped (03/07/17 0900)  . lactated ringers Stopped (03/07/17 0900)   PRN Meds: sodium chloride, sodium chloride, metoCLOPramide (REGLAN) injection, metoprolol tartrate, ondansetron (ZOFRAN) IV, oxyCODONE, promethazine, promethazine, sodium chloride flush, traMADol   Vital Signs    Vitals:   03/10/17 1429 03/10/17 2051 03/10/17 2152 03/11/17 0544  BP: 97/64 104/67  102/74  Pulse: 71 60 94 (!) 101  Resp:  18  18  Temp: 98.5 F (36.9 C) 99.1 F (37.3 C)  98.6 F (37 C)  TempSrc: Oral Oral  Oral  SpO2:  100%  93%  Weight:    137 lb (62.1 kg)  Height:        Intake/Output Summary (Last 24 hours) at 03/11/17 1107 Last  data filed at 03/10/17 1800  Gross per 24 hour  Intake              240 ml  Output                0 ml  Net              240 ml   Filed Weights   03/09/17 0500 03/10/17 0443 03/11/17 0544  Weight: 143 lb 4.8 oz (65 kg) 140 lb 9.6 oz (63.8 kg) 137 lb (62.1 kg)    Telemetry    Atrial fib - HR 80-90 - Personally Reviewed  ECG    Atrial fib  - Personally Reviewed  Physical Exam   GEN: No acute distress.   Neck: No JVD Cardiac: Irreg. IRreg. ,  Soft systolic murmur  Respiratory: Clear to auscultation bilaterally. GI: Soft, nontender, non-distended  MS: No edema; No deformity. Neuro:  Nonfocal  Psych: Normal affect   Labs    Chemistry Recent Labs Lab 03/07/17 0330  03/07/17 1650 03/07/17 1656 03/08/17 0400 03/09/17 0317  NA 139  --   --  137 136 132*  K 4.7  --   --  5.5*  4.6 4.4  CL 111  --   --  101 104 97*  CO2 21*  --   --   --  28 26  GLUCOSE 118*  --   --  151* 102* 112*  BUN 24*  --   --  30* 30* 28*  CREATININE 0.96  < > 1.12 1.00 1.02 0.95  CALCIUM 8.0*  --   --   --  8.4* 8.3*  GFRNONAA >60  < > >60  --  >60 >60  GFRAA >60  < > >60  --  >60 >60  ANIONGAP 7  --   --   --  4* 9  < > = values in this interval not displayed.   Hematology Recent Labs Lab 03/07/17 1650 03/07/17 1656 03/08/17 0400 03/09/17 0317  WBC 11.5*  --  9.8 9.0  RBC 2.96*  --  2.56* 2.53*  HGB 9.6* 9.5* 8.4* 8.3*  HCT 29.4* 28.0* 25.8* 25.3*  MCV 99.3  --  100.8* 100.0  MCH 32.4  --  32.8 32.8  MCHC 32.7  --  32.6 32.8  RDW 13.8  --  14.0 13.7  PLT 110*  --  85* 100*    Cardiac EnzymesNo results for input(s): TROPONINI in the last 168 hours. No results for input(s): TROPIPOC in the last 168 hours.   BNPNo results for input(s): BNP, PROBNP in the last 168 hours.   DDimer No results for input(s): DDIMER in the last 168 hours.   Radiology    No results found.  Cardiac Studies     Patient Profile     66 y.o. male  S/p AVR   Assessment & Plan    1.  Post op  atrial fib:   HR is well controlled . I think he could go home. Although not absolutely required, we could bridge with Lovenox if Dr. Cyndia Bent doesn't think that would cause too great a risk for post op bleeding. Alternatively, we would continue coumadin and allow him to drift up slowly ( to avoid bleeding risk )   Anticipate follow up in our coumadin clinic early this week  Anticipate cardioversion in 3-4 weeks.   2. AVR :   Doing well    Signed, Mertie Moores, MD  03/11/2017, 11:07 AM

## 2017-03-11 NOTE — Discharge Instructions (Addendum)
Aortic Valve Replacement, Care After  Refer to this sheet in the next few weeks. These instructions provide you with information about caring for yourself after your procedure. Your health care provider may also give you more specific instructions. Your treatment has been planned according to current medical practices, but problems sometimes occur. Call your health care provider if you have any problems or questions after your procedure. What can I expect after the procedure? After the procedure, it is common to have:  Pain around your incision area.  A small amount of blood or clear fluid coming from your incision.  Follow these instructions at home: Eating and drinking   Follow instructions from your health care provider about eating or drinking restrictions. ? Limit alcohol intake to no more than 1 drink per day for nonpregnant women and 2 drinks per day for men. One drink equals 12 oz of beer, 5 oz of wine, or 1 oz of hard liquor. ? Limit how much caffeine you drink. Caffeine can affect your heart's rate and rhythm.  Drink enough fluid to keep your urine clear or pale yellow.  Eat a heart-healthy diet. This should include plenty of fresh fruits and vegetables. If you eat meat, it should be lean cuts. Avoid foods that are: ? High in salt, saturated fat, or sugar. ? Canned or highly processed. ? Fried. Activity  Return to your normal activities as told by your health care provider. Ask your health care provider what activities are safe for you.  Exercise regularly once you have recovered, as told by your health care provider.  Avoid sitting for more than 2 hours at a time without moving. Get up and move around at least once every 1-2 hours. This helps to prevent blood clots in the legs.  Do not lift anything that is heavier than 10 lb (4.5 kg) until your health care provider approves.  Avoid pushing or pulling things with your arms until your health care provider approves. This  includes pulling on handrails to help you climb stairs. Incision care   Follow instructions from your health care provider about how to take care of your incision. Make sure you: ? Wash your hands with soap and water before you change your bandage (dressing). If soap and water are not available, use hand sanitizer. ? Change your dressing as told by your health care provider. ? Leave stitches (sutures), skin glue, or adhesive strips in place. These skin closures may need to stay in place for 2 weeks or longer. If adhesive strip edges start to loosen and curl up, you may trim the loose edges. Do not remove adhesive strips completely unless your health care provider tells you to do that.  Check your incision area every day for signs of infection. Check for: ? More redness, swelling, or pain. ? More fluid or blood. ? Warmth. ? Pus or a bad smell. Medicines  Take over-the-counter and prescription medicines only as told by your health care provider.  If you were prescribed an antibiotic medicine, take it as told by your health care provider. Do not stop taking the antibiotic even if you start to feel better. Travel  Avoid airplane travel for as long as told by your health care provider.  When you travel, bring a list of your medicines and a record of your medical history with you. Carry your medicines with you. Driving  Ask your health care provider when it is safe for you to drive. Do not drive until your  health care provider approves.  Do not drive or operate heavy machinery while taking prescription pain medicine. Lifestyle   Do not use any tobacco products, such as cigarettes, chewing tobacco, or e-cigarettes. If you need help quitting, ask your health care provider.  Resume sexual activity as told by your health care provider. Do not use medicines for erectile dysfunction unless your health care provider approves, if this applies.  Work with your health care provider to keep your  blood pressure and cholesterol under control, and to manage any other heart conditions that you have.  Maintain a healthy weight. General instructions  Do not take baths, swim, or use a hot tub until your health care provider approves.  Do not strain to have a bowel movement.  Avoid crossing your legs while sitting down.  Check your temperature every day for a fever. A fever may be a sign of infection.  If you are a woman and you plan to become pregnant, talk with your health care provider before you become pregnant.  Wear compression stockings if your health care provider instructs you to do this. These stockings help to prevent blood clots and reduce swelling in your legs.  Tell all health care providers who care for you that you have an artificial (prosthetic) aortic valve. If you have or have had heart disease or endocarditis, tell all health care providers about these conditions as well.  Keep all follow-up visits as told by your health care provider. This is important. Contact a health care provider if:  You develop a skin rash.  You experience sudden, unexplained changes in your weight.  You have more redness, swelling, or pain around your incision.  You have more fluid or blood coming from your incision.  Your incision feels warm to the touch.  You have pus or a bad smell coming from your incision.  You have a fever. Get help right away if:  You develop chest pain that is different from the pain coming from your incision.  You develop shortness of breath or difficulty breathing.  You start to feel light-headed. These symptoms may represent a serious problem that is an emergency. Do not wait to see if the symptoms will go away. Get medical help right away. Call your local emergency services (911 in the [place] Do not drive yourself to the hospital. This information is not intended to replace advice given to you by your health care provider. Make sure you discuss any  questions you have with your health care provider. Document Released: 03/17/2005 Document Revised: 02/04/2016 Document Reviewed: 08/02/2015 Elsevier Interactive Patient Education  2017 [place] on my medicine - Coumadin   (Warfarin)  This medication education was reviewed with me or my healthcare representative as part of my discharge preparation.  Why was Coumadin prescribed for you? Coumadin was prescribed for you because you have a blood clot or a medical condition that can cause an increased risk of forming blood clots. Blood clots can cause serious health problems by blocking the flow of blood to the heart, lung, or brain. Coumadin can prevent harmful blood clots from forming. As a reminder your indication for Coumadin is:   Stroke Prevention Because Of Atrial Fibrillation  What test will check on my response to Coumadin? While on Coumadin (warfarin) you will need to have an INR test regularly to ensure that your dose is keeping you in the desired range. The INR (international normalized ratio) number is calculated from the result of  the laboratory test called prothrombin time (PT).  If an INR APPOINTMENT HAS NOT ALREADY BEEN MADE FOR YOU please schedule an appointment to have this lab work done by your health care provider within 7 days. Your INR goal is usually a number between:  2 to 3 or your provider may give you a more narrow range like 2-2.5.  Ask your health care provider during an office visit what your goal INR is.  What  do you need to  know  About  COUMADIN? Take Coumadin (warfarin) exactly as prescribed by your healthcare provider about the same time each day.  DO NOT stop taking without talking to the doctor who prescribed the medication.  Stopping without other blood clot prevention medication to take the place of Coumadin may increase your risk of developing a new clot or stroke.  Get refills before you run out.  What do you do if you miss a dose? If you  miss a dose, take it as soon as you remember on the same day then continue your regularly scheduled regimen the next day.  Do not take two doses of Coumadin at the same time.  Important Safety Information A possible side effect of Coumadin (Warfarin) is an increased risk of bleeding. You should call your healthcare provider right away if you experience any of the following: ? Bleeding from an injury or your nose that does not stop. ? Unusual colored urine (red or dark brown) or unusual colored stools (red or black). ? Unusual bruising for unknown reasons. ? A serious fall or if you hit your head (even if there is no bleeding).  Some foods or medicines interact with Coumadin (warfarin) and might alter your response to warfarin. To help avoid this: ? Eat a balanced diet, maintaining a consistent amount of Vitamin K. ? Notify your provider about major diet changes you plan to make. ? Avoid alcohol or limit your intake to 1 drink for women and 2 drinks for men per day. (1 drink is 5 oz. wine, 12 oz. beer, or 1.5 oz. liquor.)  Make sure that ANY health care provider who prescribes medication for you knows that you are taking Coumadin (warfarin).  Also make sure the healthcare provider who is monitoring your Coumadin knows when you have started a new medication including herbals and non-prescription products.  Coumadin (Warfarin)  Major Drug Interactions  Increased Warfarin Effect Decreased Warfarin Effect  Alcohol (large quantities) Antibiotics (esp. Septra/Bactrim, Flagyl, Cipro) Amiodarone (Cordarone) Aspirin (ASA) Cimetidine (Tagamet) Megestrol (Megace) NSAIDs (ibuprofen, naproxen, etc.) Piroxicam (Feldene) Propafenone (Rythmol SR) Propranolol (Inderal) Isoniazid (INH) Posaconazole (Noxafil) Barbiturates (Phenobarbital) Carbamazepine (Tegretol) Chlordiazepoxide (Librium) Cholestyramine (Questran) Griseofulvin Oral Contraceptives Rifampin Sucralfate (Carafate) Vitamin K    Coumadin (Warfarin) Major Herbal Interactions  Increased Warfarin Effect Decreased Warfarin Effect  Garlic Ginseng Ginkgo biloba Coenzyme Q10 Green tea St. Johns wort    Coumadin (Warfarin) FOOD Interactions  Eat a consistent number of servings per week of foods HIGH in Vitamin K (1 serving =  cup)  Collards (cooked, or boiled & drained) Kale (cooked, or boiled & drained) Mustard greens (cooked, or boiled & drained) Parsley *serving size only =  cup Spinach (cooked, or boiled & drained) Swiss chard (cooked, or boiled & drained) Turnip greens (cooked, or boiled & drained)  Eat a consistent number of servings per week of foods MEDIUM-HIGH in Vitamin K (1 serving = 1 cup)  Asparagus (cooked, or boiled & drained) Broccoli (cooked, boiled & drained, or raw & chopped)  Brussel sprouts (cooked, or boiled & drained) *serving size only =  cup Lettuce, raw (green leaf, endive, romaine) Spinach, raw Turnip greens, raw & chopped   These websites have more information on Coumadin (warfarin):  FailFactory.se; VeganReport.com.au;

## 2017-03-11 NOTE — Progress Notes (Addendum)
      [address]       [place] [address]             [phone]      5 Days Post-Op Procedure(s) (LRB): REPLACEMENT ASCENDING AORTA using 50mm Hemashield Graft - with Hypothermic Circulatory Arrest (N/A) TRANSESOPHAGEAL ECHOCARDIOGRAM (TEE) (N/A) AORTIC VALVE REPLACEMENT (AVR) using Magna Ease valve Size 88mm Subjective: Feels good today. Wants to go home if possible.   Objective: Vital signs in last 24 hours: Temp:  [98.5 F (36.9 C)-99.1 F (37.3 C)] 98.6 F (37 C) (06/30 0544) Pulse Rate:  [60-101] 101 (06/30 0544) Cardiac Rhythm: Atrial fibrillation (06/30 0700) Resp:  [18] 18 (06/30 0544) BP: (97-104)/(64-74) 102/74 (06/30 0544) SpO2:  [93 %-100 %] 93 % (06/30 0544) Weight:  [62.1 kg (137 lb)] 62.1 kg (137 lb) (06/30 0544)     Intake/Output from previous day: 06/29 0701 - 06/30 0700 In: 480 [P.O.:480] Out: -  Intake/Output this shift: No intake/output data recorded.  General appearance: alert, cooperative and no distress Heart: irregularly irregular Lungs: clear to auscultation bilaterally Abdomen: soft, non-tender; bowel sounds normal; no masses,  no organomegaly Extremities: extremities normal, atraumatic, no cyanosis or edema Wound: clean and dry  Lab Results:  Recent Labs  03/09/17 0317  WBC 9.0  HGB 8.3*  HCT 25.3*  PLT 100*   BMET:  Recent Labs  03/09/17 0317  NA 132*  K 4.4  CL 97*  CO2 26  GLUCOSE 112*  BUN 28*  CREATININE 0.95  CALCIUM 8.3*    PT/INR:  Recent Labs  03/11/17 0203  LABPROT 14.8  INR 1.15   ABG    Component Value Date/Time   PHART 7.358 03/06/2017 1832   HCO3 19.0 (L) 03/06/2017 1832   TCO2 25 03/07/2017 1656   ACIDBASEDEF 6.0 (H) 03/06/2017 1832   O2SAT 96.0 03/06/2017 1832   CBG (last 3)  No results for input(s): GLUCAP in the last 72 hours.  Assessment/Plan: S/P Procedure(s) (LRB): REPLACEMENT ASCENDING AORTA using 48mm Hemashield Graft - with Hypothermic Circulatory Arrest  (N/A) TRANSESOPHAGEAL ECHOCARDIOGRAM (TEE) (N/A) AORTIC VALVE REPLACEMENT (AVR) using Magna Ease valve Size 24mm  1. CV-a. Fib rate in the 100s. Stable BP. Coumadin started yesterday, will reorder for today. INR 1.15. Holding Amio due to post-op nausea and bradycardia 2. Pulm-tolerating room air.  3. Renal-weight is trending down. Remains about 5 lbs fluid overloaded. Continue daily lasix. Creatinine trending down 4. H and H stable 5. Endo- Blood glucose level well controlled.   Plan: Continue coumadin for atrial fibrillation. May need cardioversion outpatient. Will discuss a Lovenox bridge with Dr. Cyndia Bent. Possibly home later today or tomorrow.    LOS: 5 days    Elgie Collard 03/11/2017   Chart reviewed, patient examined, agree with above. He is doing well. Remains in rate-controlled AF in the 90's. BP is on the low side so would not add BB or amio. He can go home in the am on Coumadin and will need INR followed up early next week. Would not use a lovenox bridge due to risk of bleeding in chest.

## 2017-03-11 NOTE — Progress Notes (Signed)
[place] for warfarin Indication: atrial fibrillation  Allergies  Allergen Reactions  . No Known Allergies     Patient Measurements: Height: 5\' 7"  (170.2 cm) Weight: 137 lb (62.1 kg) IBW/kg (Calculated) : 66.1  Vital Signs: Temp: 98.6 F (37 C) (06/30 0544) Temp Source: Oral (06/30 0544) BP: 102/74 (06/30 0544) Pulse Rate: 101 (06/30 0544)  Labs:  Recent Labs  03/09/17 0317 03/11/17 0203  HGB 8.3*  --   HCT 25.3*  --   PLT 100*  --   LABPROT  --  14.8  INR  --  1.15  CREATININE 0.95  --     Estimated Creatinine Clearance: 67.2 mL/min (by C-G formula based on SCr of 0.95 mg/dL).   Medical History: Past Medical History:  Diagnosis Date  . Aortic valve disorder 03/06/2017    S/p AVR with Edwards pericardial tissue valve and replacement of the ascending aorta with a 32 mm Hemashield Side Arm Graft  . BASAL CELL CARCINOMA, FACE 08/19/2009   Qualifier: Diagnosis of By: Oneida Alar MD, KARL    . BASAL CELL CARCINOMA, FACE 08/19/2009   Qualifier: Diagnosis of  By: Oneida Alar MD, KARL    . Coronary artery disease   . Metatarsalgia of left foot 04/29/2014  . Neck pain, bilateral 11/08/2013  . PAF (paroxysmal atrial fibrillation) ([place]) 03/09/2017  . RHINITIS, ALLERGIC 11/09/2006   Qualifier: Diagnosis of  By: Samara Snide    . SHOULDER PAIN, RIGHT 05/06/2009   Qualifier: Diagnosis of  By: Oneida Alar MD, KARL    . Thoracic ascending aortic aneurysm ([place])   . Thyroid condition     Medications:  Prescriptions Prior to Admission  Medication Sig Dispense Refill Last Dose  . acetaminophen (TYLENOL) 500 MG tablet Take 1,000 mg by mouth daily as needed for mild pain.   03/05/2017 at Unknown time  . levothyroxine (SYNTHROID) 75 MCG tablet Take 1 tablet (75 mcg total) by mouth daily before breakfast. Please come in for lab draw only in June 18. 90 tablet 1 03/05/2017 at Unknown time  . Menthol, Topical Analgesic, (BIOFREEZE EX) Apply 1 application topically  daily as needed (pain).   Taking  . Menthol, Topical Analgesic, (ICY HOT EX) Apply 1 application topically 2 (two) times daily as needed (pain).    03/05/2017 at Unknown time    Assessment: 66 y/o male s/p pericardial tissue AVR with post-op Afib. Pharmacy consulted to begin warfarin. INR subtherapeutic 1.15. No bleeding noted, Hgb low post-op at 8.3, platelets are low at 100. Also on Lovenox for VTE prophylaxis per MD. TCTS ordered warfarin this AM - may d/c Rx consult.  Goal of Therapy:  INR 2-3 Monitor platelets by anticoagulation protocol: Yes   Plan:  - Warfarin 6 mg PO x1 again per TCTS orders - F/U discontinue Lovenox when INR >= 2 - Daily INR - Monitor CBC, s/sx of bleeding   Elicia Lamp, PharmD, BCPS Clinical Pharmacist Rx Phone # for today: [phone] After 3:30PM, please call Main Rx: #[id number] 03/11/2017 11:46 AM

## 2017-03-12 LAB — PROTIME-INR
INR: 1.13
Prothrombin Time: 14.5 seconds (ref 11.4–15.2)

## 2017-03-12 MED ORDER — WARFARIN SODIUM 5 MG PO TABS: 5.0000 mg | ORAL_TABLET | Freq: Every day | ORAL | 1 refills | Status: DC

## 2017-03-12 MED ORDER — METOPROLOL TARTRATE 25 MG PO TABS: 25.0000 mg | ORAL_TABLET | Freq: Two times a day (BID) | ORAL | 1 refills | Status: DC

## 2017-03-12 MED ORDER — ASPIRIN 325 MG PO TBEC: 325.0000 mg | DELAYED_RELEASE_TABLET | Freq: Every day | ORAL | 0 refills | Status: DC

## 2017-03-12 MED ORDER — TRAMADOL HCL 50 MG PO TABS: 50.0000 mg | ORAL_TABLET | Freq: Four times a day (QID) | ORAL | 0 refills | Status: DC | PRN

## 2017-03-12 NOTE — Progress Notes (Signed)
      [address]       [place] [address]             [phone]      6 Days Post-Op Procedure(s) (LRB): REPLACEMENT ASCENDING AORTA using 60mm Hemashield Graft - with Hypothermic Circulatory Arrest (N/A) TRANSESOPHAGEAL ECHOCARDIOGRAM (TEE) (N/A) AORTIC VALVE REPLACEMENT (AVR) using Magna Ease valve Size 22mm Subjective: Feels good this morning. Pain medication gave him a dry mouth.   Objective: Vital signs in last 24 hours: Temp:  [98.1 F (36.7 C)-98.6 F (37 C)] 98.1 F (36.7 C) (07/01 0606) Pulse Rate:  [61-94] 78 (07/01 0606) Cardiac Rhythm: Atrial fibrillation (06/30 1906) Resp:  [18] 18 (07/01 0606) BP: (91-106)/(67-71) 106/71 (07/01 0606) SpO2:  [94 %-98 %] 96 % (07/01 0606) Weight:  [61.9 kg (136 lb 8 oz)] 61.9 kg (136 lb 8 oz) (07/01 0500)     Intake/Output from previous day: 06/30 0701 - 07/01 0700 In: 0  Out: 275 [Urine:275] Intake/Output this shift: No intake/output data recorded.  General appearance: alert, cooperative and no distress Heart: irregularly irregular Lungs: clear to auscultation bilaterally Abdomen: soft, non-tender; bowel sounds normal; no masses,  no organomegaly Extremities: extremities normal, atraumatic, no cyanosis or edema Wound: clean and dry  Lab Results: No results for input(s): WBC, HGB, HCT, PLT in the last 72 hours. BMET: No results for input(s): NA, K, CL, CO2, GLUCOSE, BUN, CREATININE, CALCIUM in the last 72 hours.  PT/INR:  Recent Labs  03/12/17 0229  LABPROT 14.5  INR 1.13   ABG    Component Value Date/Time   PHART 7.358 03/06/2017 1832   HCO3 19.0 (L) 03/06/2017 1832   TCO2 25 03/07/2017 1656   ACIDBASEDEF 6.0 (H) 03/06/2017 1832   O2SAT 96.0 03/06/2017 1832   CBG (last 3)  No results for input(s): GLUCAP in the last 72 hours.  Assessment/Plan: S/P Procedure(s) (LRB): REPLACEMENT ASCENDING AORTA using 46mm Hemashield Graft - with Hypothermic Circulatory Arrest (N/A) TRANSESOPHAGEAL  ECHOCARDIOGRAM (TEE) (N/A) AORTIC VALVE REPLACEMENT (AVR) using Magna Ease valve Size 66mm  1. CV-a. Fib rate in the 100s. Stable BP. Coumadin 6mg  given but INR down to 1.13 today. Holding Amio due to hx of bradycardia and low BP 2. Pulm-tolerating room air.  3. Renal-weight is trending down. Not fluid overloaded. Will discontinue Lasix on discharge 4. H and H stable 5. Endo- Blood glucose level well controlled.   Plan: Plan to discharge today with INR draw Tuesday.     LOS: 6 days    Elgie Collard 03/12/2017

## 2017-03-12 NOTE — Progress Notes (Signed)
James Mayo to be D/C'd Home per MD order. Discussed with the patient and all questions fully answered.  Allergies as of 03/12/2017      Reactions   No Known Allergies       Medication List    STOP taking these medications   ICY HOT EX     TAKE these medications   acetaminophen 500 MG tablet Commonly known as:  TYLENOL Take 1,000 mg by mouth daily as needed for mild pain.   aspirin 325 MG EC tablet Take 1 tablet (325 mg total) by mouth daily. Start taking on:  03/13/2017   BIOFREEZE EX Apply 1 application topically daily as needed (pain).   levothyroxine 75 MCG tablet Commonly known as:  SYNTHROID Take 1 tablet (75 mcg total) by mouth daily before breakfast. Please come in for lab draw only in June 18.   metoprolol tartrate 25 MG tablet Commonly known as:  LOPRESSOR Take 1 tablet (25 mg total) by mouth 2 (two) times daily.   traMADol 50 MG tablet Commonly known as:  ULTRAM Take 1-2 tablets (50-100 mg total) by mouth every 6 (six) hours as needed for moderate pain.   warfarin 5 MG tablet Commonly known as:  COUMADIN Take 1 tablet (5 mg total) by mouth daily at 6 PM.       VVS, Skin clean, dry and intact without evidence of skin break down, no evidence of skin tears noted.  IV catheter discontinued intact. Site without signs and symptoms of complications. Dressing and pressure applied.  An After Visit Summary was printed and given to the patient.  Patient escorted via [place], and D/C home via private auto.  [name]  03/12/2017 4:09 PM

## 2017-03-13 ENCOUNTER — Telehealth: Payer: Self-pay | Admitting: Pharmacist

## 2017-03-13 ENCOUNTER — Telehealth: Payer: Self-pay | Admitting: Interventional Cardiology

## 2017-03-13 MED FILL — Heparin Sodium (Porcine) Inj 1000 Unit/ML: INTRAMUSCULAR | Qty: 2500 | Status: AC

## 2017-03-13 NOTE — Telephone Encounter (Signed)
New message    Pt wife is calling asking if pt is supposed to be wearing a heart monitor. She said it was mentioned during his discharge. Please call.

## 2017-03-13 NOTE — Telephone Encounter (Signed)
-----   Message from Elgie Collard, [place] sent at 03/11/2017 12:34 PM EDT ----- Mr. James Mayo will be going home tomorrow and will need an INR draw at the [facility] on Monday or Tuesday ( 7/2 or 7/3).   Patient is new to Coumadin for atrial fibrillation. Will also need a f/u for cardioversion in 3-4 weeks.   Thanks!   Primary Cardiologist: Dr. Irish Lack

## 2017-03-13 NOTE — Telephone Encounter (Signed)
Patient's wife made aware that he is scheduled for an appointment with Lyda Jester, PA on 7/16 at 11:00 and they will discuss the need for cardioversion at that time since the patient developed post-op Afib. Patient is taking coumadin and metoprolol. Wife states that her husband is doing fine and that he is not having any SOB and that his BP and HR have been stable. Advised wife to monitor the patient's BP and HR and let us know if he becomes symptomatic. Wife verbalized understanding and thanked me for the call.

## 2017-03-13 NOTE — Telephone Encounter (Signed)
LMOM to call back about scheduling appt in Coumadin clinic.

## 2017-03-13 NOTE — Telephone Encounter (Signed)
Pt's wife returned call to clinic and new Coumadin appt scheduled for this afternoon at 2:30pm.

## 2017-03-14 ENCOUNTER — Other Ambulatory Visit: Payer: Self-pay | Admitting: *Deleted

## 2017-03-14 ENCOUNTER — Telehealth (HOSPITAL_COMMUNITY): Payer: Self-pay | Admitting: Physician Assistant

## 2017-03-14 NOTE — Telephone Encounter (Signed)
      [address]       [place] [address]             [phone]    Izek H Ertel [phone]   S/P aortic valve replacement with replacement of the ascending aorta performed on 6/25.  Discharged home on 7/2  Medications: Current Outpatient Prescriptions on File Prior to Visit  Medication Sig Dispense Refill  . acetaminophen (TYLENOL) 500 MG tablet Take 1,000 mg by mouth daily as needed for mild pain.    Marland Kitchen aspirin 325 MG EC tablet Take 1 tablet (325 mg total) by mouth daily. 30 tablet 0  . levothyroxine (SYNTHROID) 75 MCG tablet Take 1 tablet (75 mcg total) by mouth daily before breakfast. Please come in for lab draw only in June 18. 90 tablet 1  . Menthol, Topical Analgesic, (BIOFREEZE EX) Apply 1 application topically daily as needed (pain).    . metoprolol tartrate (LOPRESSOR) 25 MG tablet Take 1 tablet (25 mg total) by mouth 2 (two) times daily. 30 tablet 1  . traMADol (ULTRAM) 50 MG tablet Take 1-2 tablets (50-100 mg total) by mouth every 6 (six) hours as needed for moderate pain. 30 tablet 0  . warfarin (COUMADIN) 5 MG tablet Take 1 tablet (5 mg total) by mouth daily at 6 PM. 30 tablet 1   No current facility-administered medications on file prior to visit.     Coumadin:  INR check Yes/No  I tried to call the patient yesterday around lunch time and left a message on the answering machine. Today I also attempted calling with the phone going straight to voicemail. It does appear that the INR draw appointment has been communicated with the patient and was suppose to happen at 2:30pm yesterday. However, I do not see any documentation of this encounter.   Nicholes Rough, PA-C

## 2017-03-14 NOTE — Patient Outreach (Signed)
[facility]) Care Management  03/14/2017  DOC MANDALA 1950/10/24 [id number]  Subjective: Telephone call to patient's home  / mobile number, no answer, left HIPAA compliant voicemail message, and requested call back.   Objective: Per KPN (point of care tool) and chart review, patient hospitalized 03/06/17 -03/13/17 for Dilated ascending aorta, with bicuspid aortic valve and aortic insufficiency.   Status post Aortic valve replacement with [facility], pericardial tissue valve, model 3300TFX 25 mm, serial #[id number], and replacement of the ascending aorta with hypothermic circulatory arrest on 03/06/17.   Patient had ED visit on 01/04/17 for fracture of multiple ribs and  transverse process of thoracic vertebra.   Patient also has a history of hypothyroidism.   Assessment: Received UMR Transition of care referral on 03/09/17.  Transition of care follow up pending patient contact.    Plan: RNCM will call patient for 2nd telephone outreach attempt, transition of care follow up, within 10 business days if no return call.     Grenda Lora H. Annia Friendly, BSN, [place] Management [facility] Telephonic CM Phone: [phone] Fax: [phone]

## 2017-03-14 NOTE — Patient Outreach (Signed)
[name]) Care Management  03/14/2017  James Mayo May 09, 1951 [id number]   Subjective: Received voicemail message from patient, states he is returning call, request no call back today, and states he will attempt to call back today or 03/15/17.   Objective: Per KPN (point of care tool) and chart review, patient hospitalized 03/06/17 -03/13/17 for Dilated ascending aorta, with bicuspid aortic valve and aortic insufficiency.   Status post Aortic valve replacement with [facility], pericardial tissue valve, model 3300TFX 25 mm, serial #[id number], and replacement of the ascending aorta with hypothermic circulatory arrest on 03/06/17.   Patient had ED visit on 01/04/17 for fracture of multiple ribs and  transverse process of thoracic vertebra.   Patient also has a history of hypothyroidism.   Assessment: Received UMR Transition of care referral on 03/09/17.  Transition of care follow up pending patient contact.    Plan: RNCM will call patient for 2nd telephone outreach attempt, transition of care follow up, within 10 business days if no return call.     Murat Rideout H. Annia Friendly, BSN, [place] Management [facility] Telephonic CM Phone: [phone] Fax: [phone]

## 2017-03-16 ENCOUNTER — Other Ambulatory Visit: Payer: Self-pay | Admitting: *Deleted

## 2017-03-16 ENCOUNTER — Ambulatory Visit (INDEPENDENT_AMBULATORY_CARE_PROVIDER_SITE_OTHER): Payer: 59 | Admitting: *Deleted

## 2017-03-16 ENCOUNTER — Other Ambulatory Visit: Payer: 59 | Admitting: *Deleted

## 2017-03-16 DIAGNOSIS — I480 Paroxysmal atrial fibrillation: Secondary | ICD-10-CM | POA: Diagnosis not present

## 2017-03-16 DIAGNOSIS — R112 Nausea with vomiting, unspecified: Secondary | ICD-10-CM

## 2017-03-16 DIAGNOSIS — Z9889 Other specified postprocedural states: Principal | ICD-10-CM

## 2017-03-16 LAB — POCT INR: INR: 2.2

## 2017-03-16 MED ORDER — ONDANSETRON HCL 4 MG PO TABS: 4.0000 mg | ORAL_TABLET | Freq: Three times a day (TID) | ORAL | 0 refills | Status: DC | PRN

## 2017-03-16 MED FILL — ONDANSETRON HCL 4 MG TABLET: 4 | 7 days supply | Qty: 20 | Fill #0

## 2017-03-16 NOTE — Patient Outreach (Addendum)
[facility]) Care Management  03/16/2017  James Mayo October 06, 1950 [id number]   Subjective:Telephone call from patient's home / mobile number, spoke with patient, and HIPAA verified.  Discussed [facility] Care Management UMR Transition of care follow up, patient voiced understanding, and is in agreement to follow up at a later time.  Patient states he is doing well, unable to talk right now, and request call back on 03/17/17.   Objective: Per KPN (point of care tool) and chart review, patient hospitalized 03/06/17 -03/13/17 for Dilated ascending aorta, with bicuspid aortic valve and aortic insufficiency. Status post Aortic valve replacement with [facility], pericardial tissue valve, model 3300TFX 25 mm, serial #[id number], and replacement of the ascending aorta with hypothermic circulatory arrest on 03/06/17. Patient had ED visit on 01/04/17 for fracture of multiple ribs and transverse process of thoracic vertebra. Patient also has a history of hypothyroidism.   Assessment: Received UMR Transition of care referral on 03/09/17. Transition of care follow up pending patient contact.    Plan:RNCM will call patient for 4th telephone outreach attempt, transition of care follow up, within 10 business days if no return call.     Lateia Fraser H. Annia Friendly, BSN, [place] Management [facility] Telephonic CM Phone: [phone] Fax: [phone]

## 2017-03-16 NOTE — Patient Instructions (Signed)

## 2017-03-16 NOTE — Patient Outreach (Signed)
[facility]) Care Management  03/16/2017  WENDY MIKLES Jul 26, 1951 [id number]   Subjective: Received voicemail message from patient, states he is returning call, and requested call back. Telephone call to patient's home  / mobile number, no answer, left HIPAA compliant voicemail message, and requested call back.     Objective: Per KPN (point of care tool) and chart review, patient hospitalized 03/06/17 -03/13/17 for Dilated ascending aorta, with bicuspid aortic valve and aortic insufficiency. Status post Aortic valve replacement with [facility], pericardial tissue valve, model 3300TFX 25 mm, serial #[id number], and replacement of the ascending aorta with hypothermic circulatory arrest on 03/06/17. Patient had ED visit on 01/04/17 for fracture of multiple ribs and transverse process of thoracic vertebra. Patient also has a history of hypothyroidism.   Assessment: Received UMR Transition of care referral on 03/09/17. Transition of care follow up pending patient contact.    Plan:RNCM will call patient for 3rd telephone outreach attempt, transition of care follow up, within 10 business days if no return call.     Giuliano Preece H. Annia Friendly, BSN, [place] Management [facility] Telephonic CM Phone: [phone] Fax: [phone]

## 2017-03-17 ENCOUNTER — Other Ambulatory Visit: Payer: 59 | Admitting: *Deleted

## 2017-03-17 ENCOUNTER — Other Ambulatory Visit: Payer: Self-pay | Admitting: *Deleted

## 2017-03-17 ENCOUNTER — Encounter: Payer: Self-pay | Admitting: *Deleted

## 2017-03-17 NOTE — Patient Outreach (Signed)
[facility]) Care Management  03/17/2017  James Mayo Jan 17, 1951 [id number]  Subjective: Telephone call from patient's home / mobile number, spoke with patient, patient's wife,  and HIPAA verified.  Patient gave [facility] verbal authorization to speak with wife James Mayo) regarding healthcare as needed.   Discussed [facility] Care Management UMR Transition of care follow up, patient voiced understanding, and is in agreement to follow up.   Patient states he is feeling very tired, still in recovery phase of surgery, is aware of signs / symptoms to report to MD, current tiredness is not unusual, and states he will report as needed.   Wife states patient  was seen at Coumadin clinic since discharge and all hospital follow up appointments scheduled.   Next provider follow up appointment scheduled for 03/27/17.  Spoke with patient's wife,  she is accessing the following [facility] benefits: outpatient pharmacy, hospital indemnity (benefit not chosen during benefit enrollment), and will call [employer] if needed to start family medical leave act (FMLA) process.  States she is currently using PAL (paid annual leave) time to be off with patient as needed.  Wife states patient does not have any education material, transition of care, care coordination, disease management, disease monitoring, transportation, community resource, or pharmacy needs at this time. States she is very appreciative of the follow up and is in agreement to receive [place] Management information on patient's behalf.   Objective: Per KPN (point of care tool) and chart review, patient hospitalized 03/06/17 -03/13/17 for Dilated ascending aorta, with bicuspid aortic valve and aortic insufficiency. Status post Aortic valve replacement with [facility], pericardial tissue valve, model 3300TFX 25 mm, serial #[id number], and replacement of the ascending aorta with hypothermic circulatory arrest on 03/06/17. Patient had ED visit on 01/04/17 for  fracture of multiple ribs and transverse process of thoracic vertebra. Patient also has a history of hypothyroidism.   Assessment: Received UMR Transition of care referral on 03/09/17. Transition of care follow up completed, no care management needs, and will proceed with case closure.    Plan:RNCM will send patient successful outreach letter, [facility] pamphlet, and magnet. RNCM will send case closure due to follow up completed / no care management needs request to [place] [facility].      Peggy Loge H. Annia Friendly, BSN, [place] Management [facility] Telephonic CM Phone: [phone] Fax: [phone]

## 2017-03-17 NOTE — Patient Outreach (Signed)
[facility]) Care Management  03/17/2017  James Mayo 05-15-1951 [id number]   Subjective: Telephone call to patient's home  / mobile number, no answer, left HIPAA compliant voicemail message, and requested call back.     Objective: Per KPN (point of care tool) and chart review, patient hospitalized 03/06/17 -03/13/17 for Dilated ascending aorta, with bicuspid aortic valve and aortic insufficiency. Status post Aortic valve replacement with [facility], pericardial tissue valve, model 3300TFX 25 mm, serial #[id number], and replacement of the ascending aorta with hypothermic circulatory arrest on 03/06/17. Patient had ED visit on 01/04/17 for fracture of multiple ribs and transverse process of thoracic vertebra. Patient also has a history of hypothyroidism.   Assessment: Received UMR Transition of care referral on 03/09/17. Transition of care follow up pending patient contact.    Plan:RNCM will send unsuccessful outreach  letter, [facility] pamphlet, and proceed with case closure, within 10 business days if no return call.    Helaina Stefano H. Annia Friendly, BSN, [place] Management [facility] Telephonic CM Phone: [phone] Fax: [phone]

## 2017-03-18 ENCOUNTER — Telehealth: Payer: Self-pay | Admitting: Student

## 2017-03-18 NOTE — Telephone Encounter (Signed)
  Patient called because he has been having difficulty sleeping since his surgery. Has been taking Tramadol PRN for pain. He denies any active pain and no recent chest pain or palpitations. Has only been sleeping 1.5 hours for the past 2 nights. Recommended he try taking OTC Melatonin or Benadryl as he has not tried any OTC medications. Healthy sleep habits reviewed. He voiced understanding of this and was appreciative of the call.    Signed, Erma Heritage, PA-C 03/18/2017, 9:58 AM Pager: [phone]

## 2017-03-23 ENCOUNTER — Ambulatory Visit (INDEPENDENT_AMBULATORY_CARE_PROVIDER_SITE_OTHER): Payer: 59 | Admitting: *Deleted

## 2017-03-23 DIAGNOSIS — I480 Paroxysmal atrial fibrillation: Secondary | ICD-10-CM

## 2017-03-23 LAB — POCT INR: INR: 2.5

## 2017-03-27 ENCOUNTER — Ambulatory Visit (INDEPENDENT_AMBULATORY_CARE_PROVIDER_SITE_OTHER): Payer: 59 | Admitting: Cardiology

## 2017-03-27 ENCOUNTER — Telehealth: Payer: Self-pay | Admitting: *Deleted

## 2017-03-27 ENCOUNTER — Encounter: Payer: Self-pay | Admitting: Cardiology

## 2017-03-27 ENCOUNTER — Encounter: Payer: Self-pay | Admitting: *Deleted

## 2017-03-27 VITALS — BP 110/78 | HR 79 | Ht 67.5 in | Wt 129.1 lb

## 2017-03-27 DIAGNOSIS — I481 Persistent atrial fibrillation: Secondary | ICD-10-CM

## 2017-03-27 DIAGNOSIS — I4819 Other persistent atrial fibrillation: Secondary | ICD-10-CM

## 2017-03-27 MED FILL — METOPROLOL TARTRATE 25 MG T: 25 | 15 days supply | Qty: 30 | Fill #0
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
## 2017-03-27 NOTE — Patient Instructions (Addendum)
Medication Instructions:   Your physician recommends that you continue on your current medications as directed. Please refer to the Current Medication list given to you today.    If you need a refill on your cardiac medications before your next appointment, please call your pharmacy.   Labwork:   RETURN FOR LABS ON 04-10-17 CBC AND BMET  AFTER COUMADIN VISIT (COUMADIN NOTIFIED)   Testing/Procedures: SEE LETTER FOR CARDIOVERSION  04-14-2017    Follow-Up:  WITH [facility] ONE WEEK AFTER  CARDIOVERSION 04-14-2017   WITH DR Irish Lack IN 4 TO 6 WEEKS    Any Other Special Instructions Will Be Listed Below (If Applicable).

## 2017-03-27 NOTE — Telephone Encounter (Signed)
PT IS SCHEDULED FOR DCCV ON 04-14-2017 AND WILL NEED [T INR CHECK BEFORE PROCEDURE

## 2017-03-31 ENCOUNTER — Telehealth: Payer: Self-pay | Admitting: Cardiology

## 2017-03-31 ENCOUNTER — Ambulatory Visit (INDEPENDENT_AMBULATORY_CARE_PROVIDER_SITE_OTHER): Payer: 59 | Admitting: Pharmacist

## 2017-03-31 DIAGNOSIS — I480 Paroxysmal atrial fibrillation: Secondary | ICD-10-CM | POA: Diagnosis not present

## 2017-03-31 LAB — POCT INR: INR: 1.8

## 2017-03-31 NOTE — Telephone Encounter (Signed)
° ° ° °  INR was not in therapy range , he is schedule for cardio version for August 3rd , how long does it have to be in range for the procedure to take place

## 2017-03-31 NOTE — Telephone Encounter (Signed)
Returned call to pt.  Him and his wife have been advised that we technically like to have 4 therapeutic INR's before the Cardioversion, but that Claiborne Billings, the Pharmacist, has sent Dr. Irish Lack & Ellen Henri, PA-C a message and once they direct whether we need to push his procedure back or not, we would let them know.  Otherwise, just follow Kelly's instructions and pt will come back in 1 week for INR.  They verbalized understanding and thanked me for my call.

## 2017-04-03 ENCOUNTER — Encounter: Payer: Self-pay | Admitting: Interventional Cardiology

## 2017-04-07 ENCOUNTER — Ambulatory Visit (INDEPENDENT_AMBULATORY_CARE_PROVIDER_SITE_OTHER): Payer: 59 | Admitting: *Deleted

## 2017-04-07 DIAGNOSIS — I480 Paroxysmal atrial fibrillation: Secondary | ICD-10-CM

## 2017-04-07 LAB — POCT INR: INR: 2.4

## 2017-04-10 ENCOUNTER — Ambulatory Visit: Payer: 59 | Admitting: Cardiology

## 2017-04-10 ENCOUNTER — Ambulatory Visit (INDEPENDENT_AMBULATORY_CARE_PROVIDER_SITE_OTHER): Payer: 59

## 2017-04-10 ENCOUNTER — Other Ambulatory Visit: Payer: 59 | Admitting: *Deleted

## 2017-04-10 VITALS — BP 120/72 | HR 97 | Ht 67.0 in | Wt 129.0 lb

## 2017-04-10 DIAGNOSIS — I481 Persistent atrial fibrillation: Secondary | ICD-10-CM | POA: Diagnosis not present

## 2017-04-10 DIAGNOSIS — I4819 Other persistent atrial fibrillation: Secondary | ICD-10-CM

## 2017-04-10 DIAGNOSIS — I480 Paroxysmal atrial fibrillation: Secondary | ICD-10-CM | POA: Diagnosis not present

## 2017-04-10 LAB — BASIC METABOLIC PANEL
BUN / CREAT RATIO: 29 — AB (ref 10–24)
BUN: 23 mg/dL (ref 8–27)
CHLORIDE: 102 mmol/L (ref 96–106)
CO2: 24 mmol/L (ref 20–29)
CREATININE: 0.79 mg/dL (ref 0.76–1.27)
Calcium: 9.1 mg/dL (ref 8.6–10.2)
GFR calc Af Amer: 108 mL/min/{1.73_m2} (ref >59)
GFR calc non Af Amer: 94 mL/min/{1.73_m2} (ref >59)
GLUCOSE: 111 mg/dL — AB (ref 65–99)
Potassium: 4.3 mmol/L (ref 3.5–5.2)
SODIUM: 141 mmol/L (ref 134–144)

## 2017-04-10 LAB — CBC
HEMATOCRIT: 34.6 % — AB (ref 37.5–51.0)
Hemoglobin: 11.3 g/dL — ABNORMAL LOW (ref 13.0–17.7)
MCH: 30.1 pg (ref 26.6–33.0)
MCHC: 32.7 g/dL (ref 31.5–35.7)
MCV: 92 fL (ref 79–97)
PLATELETS: 324 10*3/uL (ref 150–379)
RBC: 3.76 x10E6/uL — AB (ref 4.14–5.80)
RDW: 14.4 % (ref 12.3–15.4)
WBC: 5.8 10*3/uL (ref 3.4–10.8)

## 2017-04-10 MED ORDER — METOPROLOL TARTRATE 25 MG PO TABS: 25.0000 mg | ORAL_TABLET | Freq: Two times a day (BID) | ORAL | 1 refills | Status: DC

## 2017-04-10 MED FILL — WARFARIN SODIUM 5 MG TABLET: 5 | 30 days supply | Qty: 30 | Fill #0

## 2017-04-10 NOTE — Patient Instructions (Addendum)
1.) Reason for visit: EKG prior to TEE/DCCV  2.) Name of MD requesting visit: Lyda Jester, PA/Dr. Irish Lack  3.) H&P: Persistent Post-operative Atrial Fibrillation: Recommendions are for outpatient DCCV if no spontaneous conversions  4.) ROS related to problem: Patient currently in Atrial Fibrillation. Patient denies having any symptoms at this time.  5.) Assessment and plan per MD: Patient is scheduled for DCCV on 04/14/17. TEE added to be done prior to DCCV due to subtherapeutic INR. Patient is aware and discussed this with Lyda Jester, PA today. Patient aware that he needs to arrive at 8:30 AM on 04/14/17.

## 2017-04-11 ENCOUNTER — Other Ambulatory Visit: Payer: Self-pay | Admitting: Cardiology

## 2017-04-11 MED ORDER — METOPROLOL TARTRATE 25 MG PO TABS: 25.0000 mg | ORAL_TABLET | Freq: Two times a day (BID) | ORAL | 3 refills | Status: DC

## 2017-04-11 MED FILL — METOPROLOL TARTRATE 25 MG T: 25 | 90 days supply | Qty: 180 | Fill #0

## 2017-04-11 NOTE — Telephone Encounter (Signed)
Pt's medication was sent to pt's pharmacy as requested. Confirmation received.  °

## 2017-04-13 ENCOUNTER — Ambulatory Visit (INDEPENDENT_AMBULATORY_CARE_PROVIDER_SITE_OTHER): Payer: 59 | Admitting: *Deleted

## 2017-04-13 DIAGNOSIS — I480 Paroxysmal atrial fibrillation: Secondary | ICD-10-CM

## 2017-04-13 LAB — POCT INR: INR: 2.2

## 2017-04-14 ENCOUNTER — Ambulatory Visit (HOSPITAL_COMMUNITY)
Admission: RE | Admit: 2017-04-14 | Discharge: 2017-04-14 | Disposition: A | Discharge status: Home / Self Care | Payer: 59 | Source: Ambulatory Visit | Attending: Cardiology | Admitting: Cardiology

## 2017-04-14 ENCOUNTER — Ambulatory Visit (HOSPITAL_BASED_OUTPATIENT_CLINIC_OR_DEPARTMENT_OTHER)
Admission: RE | Admit: 2017-04-14 | Discharge: 2017-04-14 | Disposition: A | Discharge status: Home / Self Care | Payer: 59 | Source: Ambulatory Visit | Attending: Cardiology | Admitting: Cardiology

## 2017-04-14 ENCOUNTER — Other Ambulatory Visit: Payer: Self-pay

## 2017-04-14 ENCOUNTER — Ambulatory Visit (HOSPITAL_COMMUNITY): Payer: 59 | Admitting: Certified Registered Nurse Anesthetist

## 2017-04-14 ENCOUNTER — Encounter (HOSPITAL_COMMUNITY): Payer: Self-pay

## 2017-04-14 ENCOUNTER — Encounter (HOSPITAL_COMMUNITY)
Admission: RE | Disposition: A | Discharge status: Home / Self Care | Payer: Self-pay | Source: Ambulatory Visit | Attending: Cardiology

## 2017-04-14 DIAGNOSIS — Z7901 Long term (current) use of anticoagulants: Secondary | ICD-10-CM | POA: Diagnosis not present

## 2017-04-14 DIAGNOSIS — Z7982 Long term (current) use of aspirin: Secondary | ICD-10-CM | POA: Diagnosis not present

## 2017-04-14 DIAGNOSIS — E041 Nontoxic single thyroid nodule: Secondary | ICD-10-CM | POA: Diagnosis not present

## 2017-04-14 DIAGNOSIS — I2510 Atherosclerotic heart disease of native coronary artery without angina pectoris: Secondary | ICD-10-CM | POA: Insufficient documentation

## 2017-04-14 DIAGNOSIS — I712 Thoracic aortic aneurysm, without rupture: Secondary | ICD-10-CM | POA: Diagnosis not present

## 2017-04-14 DIAGNOSIS — I340 Nonrheumatic mitral (valve) insufficiency: Secondary | ICD-10-CM | POA: Diagnosis not present

## 2017-04-14 DIAGNOSIS — I719 Aortic aneurysm of unspecified site, without rupture: Secondary | ICD-10-CM | POA: Diagnosis not present

## 2017-04-14 DIAGNOSIS — Z953 Presence of xenogenic heart valve: Secondary | ICD-10-CM | POA: Insufficient documentation

## 2017-04-14 DIAGNOSIS — I739 Peripheral vascular disease, unspecified: Secondary | ICD-10-CM | POA: Insufficient documentation

## 2017-04-14 DIAGNOSIS — I4891 Unspecified atrial fibrillation: Secondary | ICD-10-CM

## 2017-04-14 DIAGNOSIS — I480 Paroxysmal atrial fibrillation: Secondary | ICD-10-CM | POA: Diagnosis not present

## 2017-04-14 DIAGNOSIS — I517 Cardiomegaly: Secondary | ICD-10-CM | POA: Insufficient documentation

## 2017-04-14 DIAGNOSIS — I481 Persistent atrial fibrillation: Secondary | ICD-10-CM | POA: Diagnosis not present

## 2017-04-14 DIAGNOSIS — E039 Hypothyroidism, unspecified: Secondary | ICD-10-CM | POA: Diagnosis not present

## 2017-04-14 HISTORY — PX: TEE WITHOUT CARDIOVERSION: SHX5443

## 2017-04-14 HISTORY — PX: CARDIOVERSION: SHX1299

## 2017-04-14 SURGERY — CARDIOVERSION
Anesthesia: General

## 2017-04-14 MED ORDER — PROPOFOL 500 MG/50ML IV EMUL
INTRAVENOUS | Status: DC | PRN
  Administered 2017-04-14: 75 ug/kg/min via INTRAVENOUS

## 2017-04-14 MED ORDER — LACTATED RINGERS IV SOLN
INTRAVENOUS | Status: DC | PRN
  Administered 2017-04-14: 10:00:00 via INTRAVENOUS

## 2017-04-14 MED ORDER — PROPOFOL 10 MG/ML IV BOLUS
INTRAVENOUS | Status: DC | PRN
  Administered 2017-04-14 (×3): 20 mg via INTRAVENOUS

## 2017-04-14 NOTE — Discharge Instructions (Signed)
TEE ° °YOU HAD AN CARDIAC PROCEDURE TODAY: Refer to the procedure report and other information in the discharge instructions given to you for any specific questions about what was found during the examination. If this information does not answer your questions, please call [facility] office at [phone] to clarify.  ° °DIET: Your first meal following the procedure should be a light meal and then it is ok to progress to your normal diet. A half-sandwich or bowl of soup is an example of a good first meal. Heavy or fried foods are harder to digest and may make you feel nauseous or bloated. Drink plenty of fluids but you should avoid alcoholic beverages for 24 hours. If you had a esophageal dilation, please see attached instructions for diet.  ° °ACTIVITY: Your care partner should take you home directly after the procedure. You should plan to take it easy, moving slowly for the rest of the day. You can resume normal activity the day after the procedure however YOU SHOULD NOT DRIVE, use power tools, machinery or perform tasks that involve climbing or major physical exertion for 24 hours (because of the sedation medicines used during the test).  ° °SYMPTOMS TO REPORT IMMEDIATELY: °A cardiologist can be reached at any hour. Please call [phone] for any of the following symptoms:  °Vomiting of blood or coffee ground material  °New, significant abdominal pain  °New, significant chest pain or pain under the shoulder blades  °Painful or persistently difficult swallowing  °New shortness of breath  °Black, tarry-looking or red, bloody stools ° °FOLLOW UP:  °Please also call with any specific questions about appointments or follow up tests. ° °Electrical Cardioversion, Care After °This sheet gives you information about how to care for yourself after your procedure. Your health care provider may also give you more specific instructions. If you have problems or questions, contact your health care provider. °What can I  expect after the procedure? °After the procedure, it is common to have: °· Some redness on the skin where the shocks were given. ° °Follow these instructions at home: °· Do not drive for 24 hours if you were given a medicine to help you relax (sedative). °· Take over-the-counter and prescription medicines only as told by your health care provider. °· Ask your health care provider how to check your pulse. Check it often. °· Rest for 48 hours after the procedure or as told by your health care provider. °· Avoid or limit your caffeine use as told by your health care provider. °Contact a health care provider if: °· You feel like your heart is beating too quickly or your pulse is not regular. °· You have a serious muscle cramp that does not go away. °Get help right away if: °· You have discomfort in your chest. °· You are dizzy or you feel faint. °· You have trouble breathing or you are short of breath. °· Your speech is slurred. °· You have trouble moving an arm or leg on one side of your body. °· Your fingers or toes turn cold or blue. °This information is not intended to replace advice given to you by your health care provider. Make sure you discuss any questions you have with your health care provider. °Document Released: 06/19/2013 Document Revised: 04/01/2016 Document Reviewed: 03/04/2016 °Elsevier Interactive Patient Education © 2018 [employer] ° °

## 2017-04-14 NOTE — CV Procedure (Signed)
Procedure: TEE  Indication: Atrial fibrillation  Sedation: Per anesthesiology  Findings: Please see echo section for full report.  Trivial pericardial effusion.  Normal LV size with mild LV hypertrophy.  EF 60-65%, no regional wall motion abnormalities.  Normal RV size and systolic function.  Moderate left atrial enlargement, no LA appendage thrombus.  Mild right atrial enlargement.  No PFO or ASD by color doppler.  Trivial TR with peak RV-RA gradient 20 mmHg.  There was mild prolapse of the anterior mitral leaflet with mild MR.  There was a bioprosthetic aortic valve with trivial AI and mean gradient 10 mmHg (no stenosis).  Normal caliber aorta with minimal plaque.   Proceed to DCCV.   James Mayo 04/14/2017 10:36 AM

## 2017-04-14 NOTE — Procedures (Signed)
Electrical Cardioversion Procedure Note CORNELL BOURBON [id number] 08/07/1951  Procedure: Electrical Cardioversion Indications:  Atrial Fibrillation.  INR > 2 yesterday, TEE with no thrombus.   Procedure Details Consent: Risks of procedure as well as the alternatives and risks of each were explained to the (patient/caregiver).  Consent for procedure obtained. Time Out: Verified patient identification, verified procedure, site/side was marked, verified correct patient position, special equipment/implants available, medications/allergies/relevent history reviewed, required imaging and test results available.  Performed  Patient placed on cardiac monitor, pulse oximetry, supplemental oxygen as necessary.  Sedation given: Propofol per anesthesiology Pacer pads placed anterior and posterior chest.  Cardioverted 1 time(s).  Cardioverted at [place].  Evaluation Findings: Post procedure EKG shows: NSR Complications: None Patient did tolerate procedure well.   Loralie Champagne 04/14/2017, 10:37 AM

## 2017-04-14 NOTE — Interval H&P Note (Signed)
History and Physical Interval Note:  04/14/2017 10:10 AM  James Mayo  has presented today for surgery, with the diagnosis of afib  The various methods of treatment have been discussed with the patient and family. After consideration of risks, benefits and other options for treatment, the patient has consented to  Procedure(s): CARDIOVERSION (N/A) TRANSESOPHAGEAL ECHOCARDIOGRAM (TEE) (N/A) as a surgical intervention .  The patient's history has been reviewed, patient examined, no change in status, stable for surgery.  I have reviewed the patient's chart and labs.  Questions were answered to the patient's satisfaction.     Leo Fray [employer]

## 2017-04-14 NOTE — Anesthesia Preprocedure Evaluation (Signed)
Anesthesia Evaluation  Patient identified by MRN, date of birth, ID band Patient awake    Reviewed: Allergy & Precautions, NPO status , Patient's Chart, lab work & pertinent test results, reviewed documented beta blocker date and time   History of Anesthesia Complications Negative for: history of anesthetic complications  Airway Mallampati: II  TM Distance: >3 FB Neck ROM: Full    Dental  (+) Teeth Intact   Pulmonary neg pulmonary ROS,    breath sounds clear to auscultation       Cardiovascular + CAD and + Peripheral Vascular Disease  + dysrhythmias Atrial Fibrillation  Rhythm:Irregular     Neuro/Psych negative neurological ROS  negative psych ROS   GI/Hepatic negative GI ROS, Neg liver ROS,   Endo/Other  Hypothyroidism   Renal/GU negative Renal ROS     Musculoskeletal   Abdominal   Peds  Hematology negative hematology ROS (+)   Anesthesia Other Findings   Reproductive/Obstetrics                             Anesthesia Physical Anesthesia Plan  ASA: II  Anesthesia Plan: MAC   Post-op Pain Management:    Induction: Intravenous  PONV Risk Score and Plan:   Airway Management Planned: Nasal Cannula and Simple Face Mask  Additional Equipment: None  Intra-op Plan:   Post-operative Plan:   Informed Consent: I have reviewed the patients History and Physical, chart, labs and discussed the procedure including the risks, benefits and alternatives for the proposed anesthesia with the patient or authorized representative who has indicated his/her understanding and acceptance.   Dental advisory given  Plan Discussed with: CRNA and Surgeon  Anesthesia Plan Comments:         Anesthesia Quick Evaluation

## 2017-04-14 NOTE — Anesthesia Procedure Notes (Signed)
Procedure Name: MAC Date/Time: 04/14/2017 9:57 AM Performed by: Candis Shine Pre-anesthesia Checklist: Patient identified, Emergency Drugs available, Suction available, Patient being monitored and Timeout performed Patient Re-evaluated:Patient Re-evaluated prior to induction Oxygen Delivery Method: Nasal cannula Dental Injury: Teeth and Oropharynx as per pre-operative assessment

## 2017-04-14 NOTE — Transfer of Care (Signed)
Immediate Anesthesia Transfer of Care Note  Patient: James Mayo  Procedure(s) Performed: Procedure(s): CARDIOVERSION (N/A) TRANSESOPHAGEAL ECHOCARDIOGRAM (TEE) (N/A)  Patient Location: Endoscopy Unit  Anesthesia Type:MAC  Level of Consciousness: awake, alert  and oriented  Airway & Oxygen Therapy: Patient Spontanous Breathing and Patient connected to nasal cannula oxygen  Post-op Assessment: Report given to RN and Post -op Vital signs reviewed and stable  Post vital signs: Reviewed and stable  Last Vitals:  Vitals:   04/14/17 0948 04/14/17 1039  BP: 130/80 (!) 100/56  Pulse: 98 62  Resp: 18 14  Temp: 36.9 C (!) 36.3 C    Last Pain:  Vitals:   04/14/17 1039  TempSrc: Oral         Complications: No apparent anesthesia complications
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
## 2017-04-15 ENCOUNTER — Encounter (HOSPITAL_COMMUNITY): Payer: Self-pay | Admitting: Cardiology

## 2017-04-18 ENCOUNTER — Encounter (HOSPITAL_COMMUNITY): Payer: Self-pay | Admitting: Cardiology

## 2017-04-18 NOTE — Anesthesia Postprocedure Evaluation (Signed)
Anesthesia Post Note  Patient: James Mayo  Procedure(s) Performed: Procedure(s) (LRB): CARDIOVERSION (N/A) TRANSESOPHAGEAL ECHOCARDIOGRAM (TEE) (N/A)     Patient location during evaluation: Endoscopy Anesthesia Type: General Level of consciousness: awake and patient cooperative Pain management: pain level controlled Vital Signs Assessment: post-procedure vital signs reviewed and stable Respiratory status: spontaneous breathing, nonlabored ventilation, respiratory function stable and patient connected to nasal cannula oxygen Cardiovascular status: stable Postop Assessment: no signs of nausea or vomiting Anesthetic complications: no    Last Vitals:  Vitals:   04/14/17 1100 04/14/17 1110  BP: 102/70 107/78  Pulse: (!) 54 (!) 52  Resp: 17 14  Temp:      Last Pain:  Vitals:   04/14/17 1039  TempSrc: Oral                 Vasti Yagi

## 2017-04-19 ENCOUNTER — Other Ambulatory Visit: Payer: Self-pay | Admitting: Cardiothoracic Surgery

## 2017-04-19 DIAGNOSIS — Z952 Presence of prosthetic heart valve: Secondary | ICD-10-CM

## 2017-04-20 ENCOUNTER — Ambulatory Visit (INDEPENDENT_AMBULATORY_CARE_PROVIDER_SITE_OTHER): Payer: Self-pay | Admitting: Cardiothoracic Surgery

## 2017-04-20 ENCOUNTER — Other Ambulatory Visit (HOSPITAL_COMMUNITY): Payer: Self-pay | Admitting: *Deleted

## 2017-04-20 ENCOUNTER — Encounter (HOSPITAL_COMMUNITY): Payer: Self-pay | Admitting: Emergency Medicine

## 2017-04-20 ENCOUNTER — Ambulatory Visit (INDEPENDENT_AMBULATORY_CARE_PROVIDER_SITE_OTHER): Payer: 59 | Admitting: Cardiology

## 2017-04-20 ENCOUNTER — Encounter: Payer: Self-pay | Admitting: Cardiology

## 2017-04-20 ENCOUNTER — Ambulatory Visit
Admission: RE | Admit: 2017-04-20 | Discharge: 2017-04-20 | Disposition: A | Discharge status: Home / Self Care | Payer: 59 | Source: Ambulatory Visit | Attending: Cardiothoracic Surgery | Admitting: Cardiothoracic Surgery

## 2017-04-20 ENCOUNTER — Ambulatory Visit (INDEPENDENT_AMBULATORY_CARE_PROVIDER_SITE_OTHER): Payer: 59

## 2017-04-20 ENCOUNTER — Telehealth: Payer: Self-pay

## 2017-04-20 ENCOUNTER — Telehealth (HOSPITAL_COMMUNITY): Payer: Self-pay | Admitting: *Deleted

## 2017-04-20 ENCOUNTER — Emergency Department (HOSPITAL_COMMUNITY): Payer: 59

## 2017-04-20 ENCOUNTER — Ambulatory Visit: Payer: 59 | Admitting: Physician Assistant

## 2017-04-20 VITALS — BP 121/77 | HR 74 | Ht 67.5 in | Wt 131.2 lb

## 2017-04-20 VITALS — BP 134/89 | HR 70 | Temp 99.0°F | Ht 67.0 in | Wt 130.0 lb

## 2017-04-20 DIAGNOSIS — Z7901 Long term (current) use of anticoagulants: Secondary | ICD-10-CM | POA: Insufficient documentation

## 2017-04-20 DIAGNOSIS — Z79899 Other long term (current) drug therapy: Secondary | ICD-10-CM | POA: Insufficient documentation

## 2017-04-20 DIAGNOSIS — E039 Hypothyroidism, unspecified: Secondary | ICD-10-CM | POA: Diagnosis not present

## 2017-04-20 DIAGNOSIS — I480 Paroxysmal atrial fibrillation: Secondary | ICD-10-CM

## 2017-04-20 DIAGNOSIS — Z95828 Presence of other vascular implants and grafts: Secondary | ICD-10-CM

## 2017-04-20 DIAGNOSIS — I712 Thoracic aortic aneurysm, without rupture, unspecified: Secondary | ICD-10-CM

## 2017-04-20 DIAGNOSIS — R918 Other nonspecific abnormal finding of lung field: Secondary | ICD-10-CM | POA: Diagnosis not present

## 2017-04-20 DIAGNOSIS — Z952 Presence of prosthetic heart valve: Secondary | ICD-10-CM

## 2017-04-20 DIAGNOSIS — N3000 Acute cystitis without hematuria: Secondary | ICD-10-CM | POA: Diagnosis not present

## 2017-04-20 DIAGNOSIS — R509 Fever, unspecified: Secondary | ICD-10-CM | POA: Diagnosis not present

## 2017-04-20 DIAGNOSIS — Z7982 Long term (current) use of aspirin: Secondary | ICD-10-CM | POA: Diagnosis not present

## 2017-04-20 DIAGNOSIS — R9431 Abnormal electrocardiogram [ECG] [EKG]: Secondary | ICD-10-CM | POA: Diagnosis not present

## 2017-04-20 LAB — URINALYSIS, ROUTINE W REFLEX MICROSCOPIC
Bilirubin Urine: NEGATIVE
GLUCOSE, UA: NEGATIVE mg/dL
Ketones, ur: NEGATIVE mg/dL
Nitrite: POSITIVE — AB
PH: 5 (ref 5.0–8.0)
PROTEIN: 30 mg/dL — AB
SPECIFIC GRAVITY, URINE: 1.028 (ref 1.005–1.030)
SQUAMOUS EPITHELIAL / LPF: NONE SEEN

## 2017-04-20 LAB — POCT INR: INR: 2

## 2017-04-20 LAB — CBC WITH DIFFERENTIAL/PLATELET
BASOS PCT: 0 %
Basophils Absolute: 0 10*3/uL (ref 0.0–0.1)
Eosinophils Absolute: 0 10*3/uL (ref 0.0–0.7)
Eosinophils Relative: 0 %
HEMATOCRIT: 33.7 % — AB (ref 39.0–52.0)
Hemoglobin: 10.7 g/dL — ABNORMAL LOW (ref 13.0–17.0)
Lymphocytes Relative: 7 %
Lymphs Abs: 0.8 10*3/uL (ref 0.7–4.0)
MCH: 28.4 pg (ref 26.0–34.0)
MCHC: 31.8 g/dL (ref 30.0–36.0)
MCV: 89.4 fL (ref 78.0–100.0)
MONO ABS: 0.8 10*3/uL (ref 0.1–1.0)
MONOS PCT: 7 %
NEUTROS ABS: 10.3 10*3/uL — AB (ref 1.7–7.7)
Neutrophils Relative %: 86 %
Platelets: 215 10*3/uL (ref 150–400)
RBC: 3.77 MIL/uL — ABNORMAL LOW (ref 4.22–5.81)
RDW: 14 % (ref 11.5–15.5)
WBC: 11.9 10*3/uL — ABNORMAL HIGH (ref 4.0–10.5)

## 2017-04-20 LAB — COMPREHENSIVE METABOLIC PANEL
ALBUMIN: 4.2 g/dL (ref 3.5–5.0)
ALK PHOS: 90 U/L (ref 38–126)
ALT: 24 U/L (ref 17–63)
ANION GAP: 7 (ref 5–15)
AST: 27 U/L (ref 15–41)
BUN: 28 mg/dL — AB (ref 6–20)
CALCIUM: 9 mg/dL (ref 8.9–10.3)
CO2: 26 mmol/L (ref 22–32)
Chloride: 105 mmol/L (ref 101–111)
Creatinine, Ser: 0.81 mg/dL (ref 0.61–1.24)
GFR calc Af Amer: >60 mL/min (ref >60)
GFR calc non Af Amer: >60 mL/min (ref >60)
GLUCOSE: 116 mg/dL — AB (ref 65–99)
Potassium: 3.9 mmol/L (ref 3.5–5.1)
SODIUM: 138 mmol/L (ref 135–145)
Total Bilirubin: 0.8 mg/dL (ref 0.3–1.2)
Total Protein: 7.2 g/dL (ref 6.5–8.1)

## 2017-04-20 LAB — CG4 I-STAT (LACTIC ACID): Lactic Acid, Venous: 1.31 mmol/L (ref 0.5–1.9)

## 2017-04-20 MED ORDER — ASPIRIN EC 81 MG PO TBEC: 81.0000 mg | DELAYED_RELEASE_TABLET | Freq: Every day | ORAL | 3 refills | Status: DC

## 2017-04-20 MED ORDER — METOPROLOL TARTRATE 25 MG PO TABS: 12.5000 mg | ORAL_TABLET | Freq: Two times a day (BID) | ORAL | Status: DC
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
## 2017-04-20 NOTE — Patient Instructions (Addendum)
Medication Instructions:  Your physician recommends that you continue on your current medications as directed. Please refer to the Current Medication list given to you today.   Labwork: Non ordered  Testing/Procedures: None ordered  Follow-Up: Follow up as planned with Dr.Varanasi in October 2018  Any Other Special Instructions Will Be Listed Below (If Applicable).     If you need a refill on your cardiac medications before your next appointment, please call your pharmacy.

## 2017-04-20 NOTE — Telephone Encounter (Signed)
Follow Up ° ° °Pt calling back °

## 2017-04-20 NOTE — Telephone Encounter (Signed)
Per Claiborne Billings A., Pharm-D pt should be taking Aspirin 81mg  daily NOT 325mg . Called and lmom for pt to call the office so that the change can be made.

## 2017-04-20 NOTE — Patient Instructions (Signed)
Endocarditis Information  You may be at risk for developing endocarditis since you have  an artificial heart valve  or a repaired heart valve. Endocarditis is an infection of the lining of the heart or heart valves.   Certain surgical and dental procedures may put you at risk,  such as teeth cleaning or other dental procedures or any surgery involving the respiratory, urinary, gastrointestinal tract, gallbladder or prostate.   Notify your doctor or dentist before having any invasive procedures. You will need to take antibiotics before certain procedures.   To prevent endocarditis, maintain good oral health. Seek prompt medical attention for any mouth/gum, skin or urinary tract infections.   Heart  Valve Replacement-Care After  Read the instructions outlined below and refer to this sheet for the next few weeks. These discharge instructions provide you with general information on caring for yourself after you leave the hospital. Your surgeon may also give you specific instructions. While your treatment has been planned according to the most current medical practices available, unavoidable complications occasionally occur. If you have any problems or questions after discharge, please call your surgeon. AFTER THE PROCEDURE  Full recovery from heart valve surgery can take several months.   Blood thinning (anticoagulation) treatment with warfarin is some times prescribed for 6 weeks to 3 months after surgery for those with biological valves. It is prescribed for life for those with mechanical valves.   Recovery includes healing of the surgical incision. There is a gradual building of stamina and exercise abilities. An exercise program under the direction of a physical therapist may be recommended.   Once you have an artificial valve, your heart function and your life will return to normal. You usually feel better after surgery. Shortness of breath and fatigue should lessen. If your heart was already  severely damaged before your surgery, you may continue to have problems.    Individuals with an aortic valve replacement need to take antibiotics before having dental work or other surgical procedures. This is called prophylactic antibiotic treatment. These drugs help to prevent infective endocarditis. Antibiotics are only recommended for individuals with the highest risk for developing infective endocarditis. Let your dentist and your caregiver know if you have a history of any of the following so that the necessary precautions can be taken:  Endocarditis in the past.   An artificial (prosthetic) heart valve.  HOME CARE INSTRUCTIONS   Use all medications as prescribed.   Take your temperature every morning for the first week after surgery. Record these.   Weigh yourself every morning for at least the first week after surgery and record.   Do not lift more than 15 pounds until your breastbone (sternum) has healed, about 3 months. Avoid all activities which would place strain on your incision.   You may shower as soon as directed by your caregiver after surgery. Pat incisions dry. Do not rub incisions with washcloth or towel.   Avoid driving for 4 weeks following surgery or as instructed.  Pain Control  If a prescription was given for a pain reliever, please follow your doctor's directions.   If the pain is not relieved by your medicine, becomes worse, or you have difficulty breathing, call your surgeon.  Activity  Take frequent rest periods throughout the day.   Wait one week before returning to strenuous activities such as heavy lifting (more than 10 pounds), pushing or pulling.   Talk with your doctor about when you may return to work and your exercise routine.     exercise routine.   Do not drive while taking prescription pain medication.  Nutrition  You may resume your normal diet.   Drink plenty of fluids (6-8 glasses a day).   Eat a well-balanced diet.   Call your caregiver for  persistent nausea or vomiting.  Elimination Your normal bowel function should return. If constipation should occur, you may:  Take a mild laxative.   Add fruit and bran to your diet.   Drink more fluids.   Call your doctor if constipation is not relieved.  SEEK IMMEDIATE MEDICAL CARE IF:   You develop chest pain which is not coming from your surgical cut (incision).   You develop shortness of breath or have difficulty breathing.   You develop a temperature over 101 F (38.3 C).   You have a sudden weight gain. Let your caregiver know what the weight gain is.   You develop a rash.   You develop any reaction or side effects to medications given.   You have increased bleeding from wounds.   You see redness, swelling, or have increasing pain in wounds.   You have pus coming from your wound.   You develop lightheadedness or feel faint.   Endocarditis Information  You may be at risk for developing endocarditis since you have  an artificial heart valve  or a repaired heart valve. Endocarditis is an infection of the lining of the heart or heart valves.   Certain surgical and dental procedures may put you at risk,  such as teeth cleaning or other dental procedures or any surgery involving the respiratory, urinary, gastrointestinal tract, gallbladder or prostate.   Notify your doctor or dentist before having any invasive procedures. You will need to take antibiotics before certain procedures.   To prevent endocarditis, maintain good oral health. Seek prompt medical attention for any mouth/gum, skin or urinary tract infections.   

## 2017-04-20 NOTE — Telephone Encounter (Signed)
Patient calling back. Patient was seen by Lyda Jester, PA today in the office. They were attempting to contact the patient to decrease his ASA to 81 mg QD. Instructed patient to decrease ASA to 81 mg QD. Patient verbalized understanding and thanked me for the call.

## 2017-04-20 NOTE — Progress Notes (Signed)
Lab only. Philis Fendt, MS, PA-C 2:04 PM, 04/20/2017

## 2017-04-20 NOTE — Telephone Encounter (Signed)
Returning call to pt from message left for scheduling for cardiac rehab. Pt had heart surgery 6 weeks ago. Pt referred to cardiac rehab by phase I staff. However an electronic referral was not generated therefore pt was never contacted.   Message left for pt to please contact for scheduling.  Contact information provided. Will send Dr. Irish Lack an order to sign for this patient. Cherre Huger, BSN Cardiac and Training and development officer

## 2017-04-20 NOTE — Progress Notes (Signed)
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 SURGICAL PROCEDURES:  Aortic valve replacement with [facility], pericardial tissue valve, model [id number] 25 mm, serial #[id number], and replacement of the ascending aorta with hypothermic circulatory arrest- [place] Procedure   TEE with cardioversion done August 3   History of Present Illness:     Patient returns for postop visit after replacement of his ascending aorta and aortic valve with a 25 mm pericardial tissue valve. Postoperatively the patient developed persistent atrial fibrillation. He was anticoagulated chronically for the atrial fib and underwent cardioversion on August 3.   He's had no signs or symptoms of congestive heart failure, increasing his activity appropriately.     Past Medical History:  Diagnosis Date  . Aortic valve disorder 03/06/2017    S/p AVR with Edwards pericardial tissue valve and replacement of the ascending aorta with a 32 mm Hemashield Side Arm Graft  . BASAL CELL CARCINOMA, FACE 08/19/2009   Qualifier: Diagnosis of By: Oneida Alar MD, KARL    . BASAL CELL CARCINOMA, FACE 08/19/2009   Qualifier: Diagnosis of  By: Oneida Alar MD, KARL    . Coronary artery disease   . Metatarsalgia of left foot 04/29/2014  . Neck pain, bilateral 11/08/2013  . PAF (paroxysmal atrial fibrillation) ([place]) 03/09/2017  . RHINITIS, ALLERGIC 11/09/2006   Qualifier: Diagnosis of  By: Samara Snide    . SHOULDER PAIN, RIGHT 05/06/2009   Qualifier: Diagnosis of  By: Oneida Alar MD,  KARL    . Thoracic ascending aortic aneurysm ([place])   . Thyroid condition      History  Smoking Status  . Never Smoker  Smokeless Tobacco  . Never Used    History  Alcohol Use  . Yes    Comment: 2 beers - 2-3 times/ weeek     Allergies  Allergen Reactions  . No Known Allergies     Current Outpatient Prescriptions  Medication Sig Dispense Refill  . acetaminophen (TYLENOL) 500 MG tablet Take 1,000 mg by mouth daily as needed for mild pain.    Marland Kitchen aspirin 325 MG EC tablet Take 1 tablet (325 mg total) by mouth daily. 30 tablet 0  . levothyroxine (SYNTHROID) 75 MCG tablet Take 1 tablet (75 mcg total) by mouth daily before breakfast. Please come in for lab draw only in June 18. 90 tablet 1  . metoprolol tartrate (LOPRESSOR) 25 MG tablet Take 1 tablet (25 mg total) by mouth 2 (two) times daily. (Patient taking differently: Take 12.5 mg by mouth 2 (two) times daily. ) 180 tablet 3  . warfarin (COUMADIN) 5 MG tablet Take 1 tablet (5 mg total) by mouth daily at 6 PM. 30 tablet 1   No current facility-administered medications for this visit.        Physical Exam: BP 134/89 (BP Location: Left Arm)   Pulse 70   Temp 99 F (37.2 C) (Oral)   Ht 5\' 7"  (1.702 m)   Wt 130 lb (59 kg)   SpO2 98%  BMI 20.36 kg/m   General appearance: alert, cooperative, appears stated age and no distress Neurologic: intact Heart: regular rate and rhythm, S1, S2 normal, no murmur, click, rub or gallop Lungs: clear to auscultation bilaterally Abdomen: soft, non-tender; bowel sounds normal; no masses,  no organomegaly Extremities: extremities normal, atraumatic, no cyanosis or edema and Homans sign is negative, no sign of DVT Wound: Sternum is stable and well-healed On rhythm strip in the office today the patient appears to be in sinus rhythm  Diagnostic Studies & Laboratory data:     Recent Radiology Findings:   Dg Chest 2 View  Result Date: 04/20/2017 CLINICAL DATA:  Status post aortic valve  replacement. No current chest complaints. EXAM: CHEST  2 VIEW COMPARISON:  03/09/2017 FINDINGS: Sequelae of aortic valve replacement are again identified. The cardiac silhouette is borderline enlarged. The distal descending thoracic aorta is tortuous. The patient has taken a greater inspiration than on the prior study, and the lungs are hyperinflated. Basilar lung aeration has improved, and there is no current evidence of airspace consolidation, edema, or pneumothorax. Pleural effusions have resolved. Multiple right-sided rib fractures are again seen with associated callus formation. IMPRESSION: Hyperinflation without evidence of active cardiopulmonary disease. Electronically Signed   By: Logan Bores M.D.   On: 04/20/2017 11:57      Recent Lab Findings: Lab Results  Component Value Date   WBC 5.8 04/10/2017   HGB 11.3 (L) 04/10/2017   HCT 34.6 (L) 04/10/2017   PLT 324 04/10/2017   GLUCOSE 111 (H) 04/10/2017   CHOL 195 08/25/2016   TRIG 44 08/25/2016   HDL 101 08/25/2016   LDLCALC 85 08/25/2016   ALT 22 03/02/2017   AST 29 03/02/2017   NA 141 04/10/2017   K 4.3 04/10/2017   CL 102 04/10/2017   CREATININE 0.79 04/10/2017   BUN 23 04/10/2017   CO2 24 04/10/2017   TSH 5.870 (H) 11/22/2016   INR 2.2 04/13/2017   HGBA1C 5.5 03/02/2017   Procedure: TEE  Indication: Atrial fibrillation  Sedation: Per anesthesiology  Findings: Please see echo section for full report.  Trivial pericardial effusion.  Normal LV size with mild LV hypertrophy.  EF 60-65%, no regional wall motion abnormalities.  Normal RV size and systolic function.  Moderate left atrial enlargement, no LA appendage thrombus.  Mild right atrial enlargement.  No PFO or ASD by color doppler.  Trivial TR with peak RV-RA gradient 20 mmHg.  There was mild prolapse of the anterior mitral leaflet with mild MR.  There was a bioprosthetic aortic valve with trivial AI and mean gradient 10 mmHg (no stenosis).  Normal caliber aorta with  minimal plaque.   Proceed to DCCV.   Loralie Champagne 04/14/2017 10:36 AM   Assessment / Plan:      Good progress following aortic valve replacement and ascending aorta replacement I reviewed with the patient the need for lifelong prophylaxis/ for prosthetic aortic valve He will start in cardiac rehabilitation He continues on Coumadin for atrial fibrillation, recent cardioversion appears to be holding Plan to see the patient back in 3 months    Grace Isaac MD      [address] [place] [address] Office [phone]   Beeper [phone]  04/20/2017 1:15 PM

## 2017-04-20 NOTE — ED Triage Notes (Signed)
Pt states he had a fever today at home of 102  Pt states he took some tylenol and now it is down  Pt complains of being achy all day  Pt states last night he did not sleep well last night because he felt like he needed to void but when he went he could not  Pt states he had a dr appt today and checked out okay but he had taken aspirin prior to going  Pt states he had an aortic valve replacement surgery 6.5 weeks ago

## 2017-04-21 ENCOUNTER — Emergency Department (HOSPITAL_COMMUNITY)
Admission: EM | Admit: 2017-04-21 | Discharge: 2017-04-21 | Disposition: A | Discharge status: Home / Self Care | Payer: 59 | Attending: Emergency Medicine | Admitting: Emergency Medicine

## 2017-04-21 ENCOUNTER — Other Ambulatory Visit: Payer: Self-pay

## 2017-04-21 DIAGNOSIS — Z7982 Long term (current) use of aspirin: Secondary | ICD-10-CM | POA: Diagnosis not present

## 2017-04-21 DIAGNOSIS — N3000 Acute cystitis without hematuria: Secondary | ICD-10-CM

## 2017-04-21 DIAGNOSIS — R509 Fever, unspecified: Secondary | ICD-10-CM

## 2017-04-21 DIAGNOSIS — Z7901 Long term (current) use of anticoagulants: Secondary | ICD-10-CM | POA: Diagnosis not present

## 2017-04-21 DIAGNOSIS — Z79899 Other long term (current) drug therapy: Secondary | ICD-10-CM | POA: Diagnosis not present

## 2017-04-21 LAB — TSH: TSH: 1.98 u[IU]/mL (ref 0.450–4.500)

## 2017-04-21 LAB — T4, FREE: FREE T4: 1.42 ng/dL (ref 0.82–1.77)

## 2017-04-21 LAB — CG4 I-STAT (LACTIC ACID): LACTIC ACID, VENOUS: 0.87 mmol/L (ref 0.5–1.9)

## 2017-04-21 MED ORDER — CEFTRIAXONE SODIUM 1 G IJ SOLR
1.0000 g | Freq: Once | INTRAMUSCULAR | Status: AC
  Administered 2017-04-21: 1 g via INTRAVENOUS
  Filled 2017-04-21: qty 10

## 2017-04-21 MED ORDER — CEPHALEXIN 500 MG PO CAPS: 500.0000 mg | ORAL_CAPSULE | Freq: Two times a day (BID) | ORAL | 0 refills | Status: DC

## 2017-04-21 MED ORDER — SODIUM CHLORIDE 0.9 % IV BOLUS (SEPSIS)
1000.0000 mL | Freq: Once | INTRAVENOUS | Status: AC
  Administered 2017-04-21: 1000 mL via INTRAVENOUS

## 2017-04-21 NOTE — ED Provider Notes (Addendum)
TIME SEEN: 1:13 AM  CHIEF COMPLAINT: Fever, dysuria  HPI: Patient is a 66 year old male with history of thoracic aortic aneurysm status post replacement of the ascending aorta with a graft with aortic valve replacement with a tissue valve on 03/06/17, paroxysmal atrial fibrillation on Coumadin who presents emergency department with fever that started yesterday. States he has not been feeling well all day. Has had dysuria. Denies headache, neck pain or neck stiffness. No chest pain, shortness of breath, cough. No abdominal pain. No nausea, vomiting or diarrhea. No rash. No tick bites. No recent travel. No sick contacts. Was seen by both his cardiologist and cardiothoracic surgeon today and states he was given a "clean bill of health". He has been taking Tylenol at home for his fever.  ROS: See HPI Constitutional:  fever  Eyes: no drainage  ENT: no runny nose   Cardiovascular:  no chest pain  Resp: no SOB  GI: no vomiting GU:  dysuria Integumentary: no rash  Allergy: no hives  Musculoskeletal: no leg swelling  Neurological: no slurred speech ROS otherwise negative  PAST MEDICAL HISTORY/PAST SURGICAL HISTORY:  Past Medical History:  Diagnosis Date  . Aortic valve disorder 03/06/2017    S/p AVR with Edwards pericardial tissue valve and replacement of the ascending aorta with a 32 mm Hemashield Side Arm Graft  . BASAL CELL CARCINOMA, FACE 08/19/2009   Qualifier: Diagnosis of By: Oneida Alar MD, KARL    . BASAL CELL CARCINOMA, FACE 08/19/2009   Qualifier: Diagnosis of  By: Oneida Alar MD, KARL    . Coronary artery disease   . Metatarsalgia of left foot 04/29/2014  . Neck pain, bilateral 11/08/2013  . PAF (paroxysmal atrial fibrillation) ([place]) 03/09/2017  . RHINITIS, ALLERGIC 11/09/2006   Qualifier: Diagnosis of  By: Samara Snide    . SHOULDER PAIN, RIGHT 05/06/2009   Qualifier: Diagnosis of  By: Oneida Alar MD, KARL    . Thoracic ascending aortic aneurysm ([place])   . Thyroid condition     MEDICATIONS:   Prior to Admission medications   Medication Sig Start Date End Date Taking? Authorizing Provider  acetaminophen (TYLENOL) 500 MG tablet Take 1,000 mg by mouth daily as needed for mild pain.    [provider]  aspirin EC 81 MG tablet Take 1 tablet (81 mg total) by mouth daily. 04/20/17   Consuelo Pandy, PA-C  levothyroxine (SYNTHROID) 75 MCG tablet Take 1 tablet (75 mcg total) by mouth daily before breakfast. Please come in for lab draw only in June 18. 09/12/16   Tereasa Coop, PA-C  metoprolol tartrate (LOPRESSOR) 25 MG tablet Take 0.5 tablets (12.5 mg total) by mouth 2 (two) times daily. 04/20/17 07/19/17  Lyda Jester M, PA-C  warfarin (COUMADIN) 5 MG tablet Take 1 tablet (5 mg total) by mouth daily at 6 PM. 03/12/17   Elgie Collard, PA-C    ALLERGIES:  Allergies  Allergen Reactions  . No Known Allergies     SOCIAL HISTORY:  Social History  Substance Use Topics  . Smoking status: Never Smoker  . Smokeless tobacco: Never Used  . Alcohol use Yes     Comment: 2 beers - 2-3 times/ weeek    FAMILY HISTORY: Family History  Problem Relation Age of Onset  . Hypertension Mother   . Mental illness Brother     EXAM: BP 135/89 (BP Location: Left Arm)   Pulse 72   Temp 99.9 F (37.7 C) (Oral)   Resp 18   SpO2 98%  CONSTITUTIONAL: Alert and oriented and responds appropriately to questions. Well-appearing; well-nourished, Nontoxic, smiling, appropriate, well-hydrated HEAD: Normocephalic EYES: Conjunctivae clear, pupils appear equal, EOMI ENT: normal nose; moist mucous membranes NECK: Supple, no meningismus, no nuchal rigidity, no LAD  CARD: RRR; S1 and S2 appreciated; no murmurs, no clicks, no rubs, no gallops RESP: Normal chest excursion without splinting or tachypnea; breath sounds clear and equal bilaterally; no wheezes, no rhonchi, no rales, no hypoxia or respiratory distress, speaking full sentences ABD/GI: Normal bowel sounds; non-distended; soft, non-tender,  no rebound, no guarding, no peritoneal signs, no hepatosplenomegaly BACK:  The back appears normal and is non-tender to palpation, there is no CVA tenderness EXT: Normal ROM in all joints; non-tender to palpation; no edema; normal capillary refill; no cyanosis, no calf tenderness or swelling    SKIN: Normal color for age and race; warm; no rash NEURO: Moves all extremities equally PSYCH: The patient's mood and manner are appropriate. Grooming and personal hygiene are appropriate.  MEDICAL DECISION MAKING: Patient here with fever. He is almost 7 weeks out from aortic aneurysm graft and aortic valve replacement with a tissue valve. He has no chest pain or shortness of breath. No cough. He had a chest x-ray yesterday ordered by his car to thoracic surgeon that showed no acute abnormality. He had an INR of 2.0. Here his labs are reassuring. He does have very mild leukocytosis with left shift but normal lactate. He does appear to have a urinary tract infection. We will send urine culture and also obtain blood cultures. I have very low suspicion that this is a postoperative fever given surgery was 7 weeks ago. I do not suspect there is any complication with his aortic valve replacement or graft. He was seen by his cardiologist and cardiothoracic surgeon yesterday. I suspect that his fever is from a urinary tract infection. This time he does not appear septic. I feel he is safe to be discharged. We'll give IV fluids and ceftriaxone prior to discharge. Patient and wife are comfortable with this plan. We will discharge him with Keflex. He will follow-up with his primary care physician next week. We discussed at length return precautions.  At this time, I do not feel there is any life-threatening condition present. I have reviewed and discussed all results (EKG, imaging, lab, urine as appropriate) and exam findings with patient/family. I have reviewed nursing notes and appropriate previous records.  I feel the  patient is safe to be discharged home without further emergent workup and can continue workup as an outpatient as needed. Discussed usual and customary return precautions. Patient/family verbalize understanding and are comfortable with this plan.  Outpatient follow-up has been provided if needed. All questions have been answered.     EKG Interpretation  Date/Time:  Friday April 21 2017 01:12:34 EDT Ventricular Rate:  75 PR Interval:    QRS Duration: 95 QT Interval:  390 QTC Calculation: 436 R Axis:   24 Text Interpretation:  Sinus rhythm Abnormal T, consider ischemia, lateral leads No significant change since last tracing Confirmed by Male Minish, Cyril Mourning [phone]) on 04/21/2017 2:47:49 AM          Kissy Cielo, Delice Bison, DO 04/21/17 [place], Delice Bison, DO 04/21/17 [id number]

## 2017-04-21 NOTE — ED Notes (Signed)
RN will attempt to collect the first set of blood cultures.

## 2017-04-21 NOTE — Discharge Instructions (Signed)
You may continue Tylenol 1000 mg every 6 hours as needed for pain and fever. Please continue your antibiotics until complete. Please follow up with your primary care physician next week for close recheck. If you develop chest pain, shortness of breath, vomiting and cannot stop, severe headache with neck pain or neck stiffness, please return to the closest hospital immediately.

## 2017-04-23 LAB — URINE CULTURE

## 2017-04-24 ENCOUNTER — Telehealth: Payer: Self-pay | Admitting: *Deleted

## 2017-04-24 NOTE — Telephone Encounter (Signed)
Post ED Visit - Positive Culture Follow-up  Culture report reviewed by antimicrobial stewardship pharmacist:  []  Elenor Quinones, Pharm.D. []  Heide Guile, Pharm.D., BCPS AQ-ID []  Parks Neptune, Pharm.D., BCPS []  Alycia Rossetti, Pharm.D., BCPS []  [place], Pharm.D., BCPS, AAHIVP []  Legrand Como, Pharm.D., BCPS, AAHIVP []  Salome Arnt, PharmD, BCPS []  Dimitri Ped, PharmD, BCPS []  Vincenza Hews, PharmD, BCPS Leeroy Cha, PharmD  Positive urine culture Treated with Cephalexin, organism sensitive to the same and no further patient follow-up is required at this time.  Harlon Flor [facility] 04/24/2017, 10:27 AM

## 2017-04-26 ENCOUNTER — Ambulatory Visit (INDEPENDENT_AMBULATORY_CARE_PROVIDER_SITE_OTHER): Payer: 59 | Admitting: Physician Assistant

## 2017-04-26 ENCOUNTER — Telehealth (HOSPITAL_COMMUNITY): Payer: Self-pay

## 2017-04-26 ENCOUNTER — Encounter: Payer: Self-pay | Admitting: Physician Assistant

## 2017-04-26 VITALS — BP 137/86 | HR 60 | Resp 16 | Ht 67.5 in | Wt 132.0 lb

## 2017-04-26 DIAGNOSIS — E039 Hypothyroidism, unspecified: Secondary | ICD-10-CM

## 2017-04-26 DIAGNOSIS — N3001 Acute cystitis with hematuria: Secondary | ICD-10-CM | POA: Diagnosis not present

## 2017-04-26 LAB — POCT URINALYSIS DIP (MANUAL ENTRY)
Bilirubin, UA: NEGATIVE
GLUCOSE UA: NEGATIVE mg/dL
Ketones, POC UA: NEGATIVE mg/dL
Leukocytes, UA: NEGATIVE
NITRITE UA: NEGATIVE
Protein Ur, POC: NEGATIVE mg/dL
RBC UA: NEGATIVE
Spec Grav, UA: 1.015 (ref 1.010–1.025)
UROBILINOGEN UA: 0.2 U/dL
pH, UA: 8 (ref 5.0–8.0)

## 2017-04-26 LAB — CULTURE, BLOOD (ROUTINE X 2)
Culture: NO GROWTH
Culture: NO GROWTH
Special Requests: ADEQUATE

## 2017-04-26 MED ORDER — LEVOTHYROXINE SODIUM 75 MCG PO TABS: 75.0000 ug | ORAL_TABLET | Freq: Every day | ORAL | 1 refills | Status: DC

## 2017-04-26 MED FILL — LEVOTHYROXINE 75 MCG TABLET: 75 | 90 days supply | Qty: 90 | Fill #0

## 2017-04-26 NOTE — Progress Notes (Signed)
04/26/2017 6:24 PM   DOB: June 11, 1951 / MRN: [id number]  SUBJECTIVE:  James Mayo is a 66 y.o. male presenting for recheck of TSH and to discuss his most recent UTI diagnosed at the ED. Was prescribed keflex and has been improving since. PSA was not on the differential at that time and urine dip was certainly consistent with UTI.  He has foley cathed about 6 weeks ago for 5 days for cardiac surgery.    He is allergic to no known allergies.   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    He  reports that he has never smoked. He has never used smokeless tobacco. He reports that he drinks alcohol. He reports that he does not use drugs. He  has no sexual activity history on file. The patient  has a past surgical history that includes Eye surgery (Bilateral); RIGHT/LEFT HEART CATH AND CORONARY ANGIOGRAPHY (N/A, 01/25/2017); Cardiac catheterization; Replacement ascending aorta (N/A, 03/06/2017); TEE without cardioversion (N/A, 03/06/2017); Aortic valve replacement (03/06/2017); Cardioversion (N/A, 04/14/2017); and TEE without cardioversion (N/A, 04/14/2017).  His family history includes Hypertension in his mother; Mental illness in his brother.  Review of Systems  Constitutional: Negative for chills, diaphoresis and fever.  Eyes: Negative.   Respiratory: Negative for cough, hemoptysis, sputum production, shortness of breath and wheezing.   Cardiovascular: Negative for chest pain, orthopnea and leg swelling.  Gastrointestinal: Negative for abdominal pain, blood in stool, constipation, diarrhea, heartburn, melena, nausea  and vomiting.  Genitourinary: Negative for dysuria, flank pain, frequency, hematuria and urgency.  Skin: Negative for rash.  Neurological: Negative for dizziness, sensory change, speech change, focal weakness and headaches.    The problem list and medications were reviewed and updated by myself where necessary and exist elsewhere in the encounter.   OBJECTIVE:  BP 137/86 (BP Location: Right Arm, Patient Position: Sitting, Cuff Size: Normal)   Pulse 60   Resp 16   Ht 5' 7.5" (1.715 m)   Wt 132 lb (59.9 kg)   SpO2 98%   BMI 20.37 kg/m   Physical Exam  Constitutional: He is oriented to person, place, and time. He appears well-developed. He is active and cooperative.  Non-toxic appearance.  Eyes: Pupils are equal, round, and reactive to light. EOM are normal.  Cardiovascular: Normal rate, regular rhythm, S1 normal, S2 normal, normal heart sounds, intact distal pulses and normal pulses.  Exam reveals no gallop and no friction rub.   No murmur heard. Pulmonary/Chest: Effort normal. No stridor. No tachypnea. No respiratory distress. He has no wheezes. He has no rales.  Abdominal: He exhibits no distension.  Musculoskeletal: He exhibits no edema.  Neurological: He is alert and oriented to person, place, and time. He has normal strength and normal reflexes. He is not disoriented. No cranial nerve deficit or sensory deficit. He exhibits normal muscle tone. Coordination and gait normal.  Skin: Skin is warm and dry. He is not diaphoretic. No pallor.  Psychiatric: His behavior is normal.  Vitals reviewed.   Results for orders placed or performed in visit on 04/26/17 (from the past 72 hour(s))  POCT urinalysis dipstick     Status: None   Collection Time: 04/26/17  5:16 PM  Result  Value Ref Range   Color, UA yellow yellow   Clarity, UA clear clear   Glucose, UA negative negative mg/dL   Bilirubin, UA negative negative   Ketones, POC UA negative negative mg/dL   Spec Grav, UA 1.015 1.010 -  1.025   Blood, UA negative negative   pH, UA 8.0 5.0 - 8.0   Protein Ur, POC negative negative mg/dL   Urobilinogen, UA 0.2 0.2 or 1.0 E.U./dL   Nitrite, UA Negative Negative   Leukocytes, UA Negative Negative    No results found.  ASSESSMENT AND PLAN:  James Mayo was seen today for follow-up.  Diagnoses and all orders for this visit:  Acute cystitis with hematuria: on his last dose of keflex and now asymptomatic.  Will recheck urine studies and a PSA.  -     PSA -     POCT urinalysis dipstick -     Urine Culture -     CBC with Differential  Hypothyroidism, unspecified type -     TSH -     levothyroxine (SYNTHROID) 75 MCG tablet; Take 1 tablet (75 mcg total) by mouth daily before breakfast.    The patient is advised to call or return to clinic if he does not see an improvement in symptoms, or to seek the care of the closest emergency department if he worsens with the above plan.   Philis Fendt, MHS, PA-C [facility] 04/26/2017 6:24 PM

## 2017-04-26 NOTE — Telephone Encounter (Signed)
I called to schedule and apologize for our office taking so long to contact him to schedule cardiac rehab. Patient was informed that our office had not received a referral and was not aware until he called and his wife came by cardiac rehab.Carlette had called and left message on patient voicemail prior to me contacting patient. Per Carlette order was sent to provider to sign so that we could get patient scheduled for cardiac rehab.  Patient informed me that he has scheduled with a private provider and would contact our office in 48 hours if he decided to schedule for [facility].

## 2017-04-26 NOTE — Telephone Encounter (Signed)
Patient insurance is active and benefits verified. Patient insurance is UMR - no co-payment, deductible $250/$250 has been met, out of pocket $7350/$7350 has been met, no co-insurance since deductible and out of pocket has been met and no pre-authorization. Spoke with [place] @ [place] reference [phone].

## 2017-04-26 NOTE — Patient Instructions (Signed)
     IF you received an x-ray today, you will receive an invoice from [facility]. Please contact [facility] at [phone] with questions or concerns regarding your invoice.   IF you received labwork today, you will receive an invoice from [facility]. Please contact [facility] at [phone] with questions or concerns regarding your invoice.   Our billing staff will not be able to assist you with questions regarding bills from these companies.  You will be contacted with the lab results as soon as they are available. The fastest way to get your results is to activate your My Chart account. Instructions are located on the last page of this paperwork. If you have not heard from us regarding the results in 2 weeks, please contact this office.     

## 2017-04-27 LAB — CBC WITH DIFFERENTIAL/PLATELET
BASOS ABS: 0 10*3/uL (ref 0.0–0.2)
Basos: 1 %
EOS (ABSOLUTE): 0.1 10*3/uL (ref 0.0–0.4)
EOS: 1 %
Hematocrit: 37.2 % — ABNORMAL LOW (ref 37.5–51.0)
Hemoglobin: 11.6 g/dL — ABNORMAL LOW (ref 13.0–17.7)
Immature Grans (Abs): 0 10*3/uL (ref 0.0–0.1)
Immature Granulocytes: 1 %
LYMPHS: 30 %
Lymphocytes Absolute: 2 10*3/uL (ref 0.7–3.1)
MCH: 28.9 pg (ref 26.6–33.0)
MCHC: 31.2 g/dL — ABNORMAL LOW (ref 31.5–35.7)
MCV: 93 fL (ref 79–97)
MONOCYTES: 8 %
MONOS ABS: 0.6 10*3/uL (ref 0.1–0.9)
Neutrophils Absolute: 3.9 10*3/uL (ref 1.4–7.0)
Neutrophils: 59 %
PLATELETS: 331 10*3/uL (ref 150–379)
RBC: 4.02 x10E6/uL — ABNORMAL LOW (ref 4.14–5.80)
RDW: 14.9 % (ref 12.3–15.4)
WBC: 6.5 10*3/uL (ref 3.4–10.8)

## 2017-04-27 LAB — URINE CULTURE: ORGANISM ID, BACTERIA: NO GROWTH

## 2017-04-27 LAB — TSH: TSH: 3.86 u[IU]/mL (ref 0.450–4.500)

## 2017-04-27 LAB — PSA: Prostate Specific Ag, Serum: 10.2 ng/mL — ABNORMAL HIGH (ref 0.0–4.0)

## 2017-04-28 ENCOUNTER — Ambulatory Visit (INDEPENDENT_AMBULATORY_CARE_PROVIDER_SITE_OTHER): Payer: 59 | Admitting: Physician Assistant

## 2017-04-28 ENCOUNTER — Ambulatory Visit (INDEPENDENT_AMBULATORY_CARE_PROVIDER_SITE_OTHER): Payer: 59

## 2017-04-28 ENCOUNTER — Encounter: Payer: Self-pay | Admitting: Physician Assistant

## 2017-04-28 VITALS — BP 140/85 | HR 56 | Temp 97.8°F | Resp 16 | Ht 67.5 in | Wt 129.4 lb

## 2017-04-28 DIAGNOSIS — R972 Elevated prostate specific antigen [PSA]: Secondary | ICD-10-CM

## 2017-04-28 DIAGNOSIS — I480 Paroxysmal atrial fibrillation: Secondary | ICD-10-CM

## 2017-04-28 LAB — POCT INR: INR: 1.4

## 2017-04-28 MED ORDER — CIPROFLOXACIN HCL 500 MG PO TABS: 500.0000 mg | ORAL_TABLET | Freq: Two times a day (BID) | ORAL | 0 refills | Status: AC

## 2017-04-28 MED ORDER — WARFARIN SODIUM 5 MG PO TABS: ORAL_TABLET | ORAL | 1 refills | Status: DC

## 2017-04-28 MED FILL — CIPROFLOXACIN HCL 500 MG TA: 500 | 30 days supply | Qty: 60 | Fill #0

## 2017-04-28 NOTE — Progress Notes (Signed)
04/29/2017 1:57 PM   DOB: 1951/09/06 / MRN: [id number]  SUBJECTIVE:  James Mayo is a 66 y.o. male presenting for recheck of labs and exam at my request.  Recent labs showed PSA of 10 in the setting of recent UTI. Patient is anticoagulated with coumadin 2/2 cardiac valve repair.  He feels well.  Denies dysuria, frequency, urgency, nausea, pain with defecation, low back pain and perineal pain.    He is allergic to no known allergies.   He  has a past medical history of Aortic valve disorder (03/06/2017); BASAL CELL CARCINOMA, FACE (08/19/2009); BASAL CELL CARCINOMA, FACE (08/19/2009); Coronary artery disease; Metatarsalgia of left foot (04/29/2014); Neck pain, bilateral (11/08/2013); PAF (paroxysmal atrial fibrillation) ([place]) (03/09/2017); RHINITIS, ALLERGIC (11/09/2006); SHOULDER PAIN, RIGHT (05/06/2009); Thoracic ascending aortic aneurysm ([place]); and Thyroid condition.    He  reports that he has never smoked. He has never used smokeless tobacco. He reports that he drinks alcohol. He reports that he does not use drugs. He  has no sexual activity history on file. The patient  has a past surgical history that includes Eye surgery (Bilateral); RIGHT/LEFT HEART CATH AND CORONARY ANGIOGRAPHY (N/A, 01/25/2017); Cardiac catheterization; Replacement ascending aorta (N/A, 03/06/2017); TEE without cardioversion (N/A, 03/06/2017); Aortic valve replacement (03/06/2017); Cardioversion (N/A, 04/14/2017); and TEE without cardioversion (N/A, 04/14/2017).  His family history includes Hypertension in his mother; Mental illness in his brother.  Review of Systems  Constitutional: Negative for chills, diaphoresis and fever.  Respiratory: Negative for shortness of breath.   Cardiovascular: Negative for chest pain, orthopnea and leg swelling.  Gastrointestinal: Negative for abdominal pain, blood in stool, constipation, diarrhea, heartburn, melena, nausea and vomiting.  Genitourinary: Negative for flank pain.  Skin: Negative for  rash.  Neurological: Negative for dizziness.    The problem list and medications were reviewed and updated by myself where necessary and exist elsewhere in the encounter.   OBJECTIVE:  BP 140/85 (BP Location: Right Arm, Patient Position: Sitting, Cuff Size: Normal)   Pulse (!) 56   Temp 97.8 F (36.6 C) (Oral)   Resp 16   Ht 5' 7.5" (1.715 m)   Wt 129 lb 6.4 oz (58.7 kg)   SpO2 98%   BMI 19.97 kg/m   Physical Exam  Constitutional: He appears well-developed. He is active and cooperative.  Non-toxic appearance.  Cardiovascular: Normal rate.   Pulmonary/Chest: Effort normal. No tachypnea.  Genitourinary: Prostate is not enlarged and not tender.  Neurological: He is alert.  Skin: Skin is warm and dry. He is not diaphoretic. No pallor.  Vitals reviewed.   Results for orders placed or performed in visit on 04/28/17 (from the past 72 hour(s))  PSA     Status: Abnormal   Collection Time: 04/28/17 12:17 PM  Result Value Ref Range   Prostate Specific Ag, Serum 7.3 (H) 0.0 - 4.0 ng/mL    Comment: Roche ECLIA methodology. According to the American Urological Association, Serum PSA should decrease and remain at undetectable levels after radical prostatectomy. The AUA defines biochemical recurrence as an initial PSA value 0.2 ng/mL or greater followed by a subsequent confirmatory PSA value 0.2 ng/mL or greater. Values obtained with different assay methods or kits cannot be used interchangeably. Results cannot be interpreted as absolute evidence of the presence or absence of malignant disease.   CBC     Status: Abnormal   Collection Time: 04/28/17 12:17 PM  Result Value Ref Range   WBC 6.8 3.4 - 10.8 x10E3/uL   RBC  4.22 4.14 - 5.80 x10E6/uL   Hemoglobin 11.8 (L) 13.0 - 17.7 g/dL   Hematocrit 39.4 37.5 - 51.0 %   MCV 93 79 - 97 fL   MCH 28.0 26.6 - 33.0 pg   MCHC 29.9 (L) 31.5 - 35.7 g/dL   RDW 14.8 12.3 - 15.4 %   Platelets 363 150 - 379 x10E3/uL    No results  found.  ASSESSMENT AND PLAN:  Bell was seen today for follow-up.  Diagnoses and all orders for this visit:  Elevated PSA: Precipitously dropping.  Likely some resolving prostatitis component.  He has cipro to take in the even that his symptoms begin to return, however I have advised we hold this and speak first before he begins the medication.  Will plan to recheck the PSA in a week or so.  He is taking coumadin, and I would obviously prefer a factor X inhibitor to reduce complications associated with ABX.     The patient is advised to call or return to clinic if he does not see an improvement in symptoms, or to seek the care of the closest emergency department if he worsens with the above plan.   Philis Fendt, MHS, PA-C [facility] 04/29/2017 1:57 PM

## 2017-04-28 NOTE — Patient Instructions (Signed)
     IF you received an x-ray today, you will receive an invoice from [facility]. Please contact [facility] at [phone] with questions or concerns regarding your invoice.   IF you received labwork today, you will receive an invoice from [facility]. Please contact [facility] at [phone] with questions or concerns regarding your invoice.   Our billing staff will not be able to assist you with questions regarding bills from these companies.  You will be contacted with the lab results as soon as they are available. The fastest way to get your results is to activate your My Chart account. Instructions are located on the last page of this paperwork. If you have not heard from us regarding the results in 2 weeks, please contact this office.     

## 2017-04-29 LAB — CBC
HEMATOCRIT: 39.4 % (ref 37.5–51.0)
Hemoglobin: 11.8 g/dL — ABNORMAL LOW (ref 13.0–17.7)
MCH: 28 pg (ref 26.6–33.0)
MCHC: 29.9 g/dL — ABNORMAL LOW (ref 31.5–35.7)
MCV: 93 fL (ref 79–97)
PLATELETS: 363 10*3/uL (ref 150–379)
RBC: 4.22 x10E6/uL (ref 4.14–5.80)
RDW: 14.8 % (ref 12.3–15.4)
WBC: 6.8 10*3/uL (ref 3.4–10.8)

## 2017-04-29 LAB — URINE CULTURE: ORGANISM ID, BACTERIA: NO GROWTH

## 2017-04-29 LAB — PSA: PROSTATE SPECIFIC AG, SERUM: 7.3 ng/mL — AB (ref 0.0–4.0)

## 2017-05-02 ENCOUNTER — Ambulatory Visit (INDEPENDENT_AMBULATORY_CARE_PROVIDER_SITE_OTHER): Payer: 59 | Admitting: Physician Assistant

## 2017-05-02 ENCOUNTER — Encounter: Payer: Self-pay | Admitting: Physician Assistant

## 2017-05-02 VITALS — BP 120/88 | HR 59 | Temp 99.3°F | Resp 16 | Ht 67.0 in | Wt 130.0 lb

## 2017-05-02 DIAGNOSIS — R350 Frequency of micturition: Secondary | ICD-10-CM

## 2017-05-02 DIAGNOSIS — R972 Elevated prostate specific antigen [PSA]: Secondary | ICD-10-CM | POA: Diagnosis not present

## 2017-05-02 LAB — POCT URINALYSIS DIP (MANUAL ENTRY)
Bilirubin, UA: NEGATIVE
Glucose, UA: NEGATIVE mg/dL
Ketones, POC UA: NEGATIVE mg/dL
Leukocytes, UA: NEGATIVE
Nitrite, UA: NEGATIVE
Protein Ur, POC: NEGATIVE mg/dL
Spec Grav, UA: 1.015 (ref 1.010–1.025)
Urobilinogen, UA: 0.2 E.U./dL
pH, UA: 7 (ref 5.0–8.0)

## 2017-05-02 LAB — PSA: Prostate Specific Ag, Serum: 4.2 ng/mL — ABNORMAL HIGH (ref 0.0–4.0)

## 2017-05-02 NOTE — Progress Notes (Signed)
James Mayo  MRN: [id number] DOB: Jul 22, 1951  PCP: Tereasa Coop, PA-C  Subjective:  Pt is a pleasant 66 year old male who presents to clinic for f/u UTI and elevated PSA. This is his 3rd OV for this problem. He was seen in the emergency department 8/10 for UTI and treated with Keflex. Last dose keflex was 5 days ago.  He was asymptomatic following treatment. F/u here at PCP 8/15 with Philis Fendt and was found to have an elevated PSA of 10. F/u PSA level on 8/17 was 7.3. He was given Cipro in the case his symptoms worsened. He would like to know if he should start this medication.   Last night he ran a fever of 99.1, which is what brings him here today. He is worried the infection has not cleared. Denies chills, back pain, difficulty starting his stream, incomplete voiding, pain of his penis, back pain, perineal pain, abdominal pain, discharge from penis, blood in his urine.   Of note, pt is on anticoagulation therapy with coumadin following cardiac valve repair. He has INR checks at his cardiology office q 10 days.   8/17 OV with Philis Fendt: Plan: "PSA Precipitously dropping.  Likely some resolving prostatitis component.  He has cipro to take in the even that his symptoms begin to return, however I have advised we hold this and speak first before he begins the medication.  Will plan to recheck the PSA in a week or so.  He is taking coumadin, and I would obviously prefer a factor X inhibitor to reduce complications associated with ABX."   Review of Systems  Constitutional: Positive for fever. Negative for chills, diaphoresis and fatigue.  Respiratory: Negative for cough.   Gastrointestinal: Negative for abdominal pain and nausea.  Genitourinary: Negative for decreased urine volume, difficulty urinating, discharge, dysuria, enuresis, flank pain, frequency, hematuria, penile pain, penile swelling, scrotal swelling, testicular pain and urgency.  Musculoskeletal: Negative for back pain.     Patient Active Problem List   Diagnosis Date Noted  . Paroxysmal atrial fibrillation (HCC)   . S/P AVR 03/06/2017  . S/P ascending aortic replacement 03/06/2017  . Aortic insufficiency 01/18/2017  . Aortic stenosis 01/18/2017  . Rib fractures 01/10/2017  . Hypothyroidism 01/05/2017  . Aneurysm of thoracic aorta ([place]) 01/05/2017  . Metatarsalgia of right foot 02/26/2015  . Hypothyroidism 08/06/2014  . 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 acetaminophen (TYLENOL) 500 MG tablet Take 1,000 mg by mouth daily as needed for mild pain.    Marland Kitchen aspirin EC 81 MG tablet Take 1 tablet (81 mg total) by mouth daily. 90 tablet 3  . cephALEXin (KEFLEX) 500 MG capsule Take 1 capsule (500 mg total) by mouth 2 (two) times daily. 14 capsule 0  . ciprofloxacin (CIPRO) 500 MG tablet Take 1 tablet (500 mg total) by mouth 2 (two) times daily. 60 tablet 0  . levothyroxine (SYNTHROID) 75 MCG tablet Take 1 tablet (75 mcg total) by mouth daily before breakfast. 90 tablet 1  . metoprolol tartrate (LOPRESSOR) 25 MG tablet Take 0.5 tablets (12.5 mg total) by mouth 2 (two) times daily.    Marland Kitchen warfarin (COUMADIN) 5 MG tablet Take as directed by [facility] 100 tablet 1   No current facility-administered medications on file prior to visit.     Allergies  Allergen Reactions  . No Known Allergies      Objective:  There were no vitals taken for this visit.  Physical Exam  Constitutional: He is oriented to person, place, and time and well-developed, well-nourished, and in no distress. No distress.  Cardiovascular: Normal rate, regular rhythm and normal heart sounds.   Genitourinary:  Genitourinary Comments: Prostate exam deferred   Neurological: He is alert and oriented to person, place, and time. GCS score is 15.  Skin: Skin is warm and dry.  Psychiatric: Mood, memory, affect and judgment normal.  Vitals  reviewed.   Assessment and Plan :  1. Elevated PSA 2. Increased frequency of urination - PSA - POCT Microscopic Urinalysis ([facility]) - POCT urinalysis dipstick - Urine Culture - UA is negative for UTI. Culture and PSA are pending. Spoke with Philis Fendt, he is waiting to hear back from pt's cardiologist concerning treatment with Cipro. Pt will hold off on starting Cipro at this time. Pt will RTC in 48 hours to see PA Clark to discuss lab results and plan.   Mercer Pod, PA-C  [facility] 05/02/2017 10:21 AM

## 2017-05-02 NOTE — Patient Instructions (Addendum)
  Please call your cardiologist and let them know you are taking the antibiotic Ciprofloxacin. You may need to return to    IF you received an x-ray today, you will receive an invoice from [facility]. Please contact [facility] at [phone] with questions or concerns regarding your invoice.   IF you received labwork today, you will receive an invoice from [facility]. Please contact [facility] at [phone] with questions or concerns regarding your invoice.   Our billing staff will not be able to assist you with questions regarding bills from these companies.  You will be contacted with the lab results as soon as they are available. The fastest way to get your results is to activate your My Chart account. Instructions are located on the last page of this paperwork. If you have not heard from [place] regarding the results in 2 weeks, please contact this office.

## 2017-05-02 NOTE — Progress Notes (Signed)
PSA continues to improve.  He plans to keep his appt with you this Thurs to discuss lab work and plan.  He's asymptomatic with low-grade fever which is worrying him.

## 2017-05-03 ENCOUNTER — Ambulatory Visit (INDEPENDENT_AMBULATORY_CARE_PROVIDER_SITE_OTHER): Payer: 59 | Admitting: Physician Assistant

## 2017-05-03 ENCOUNTER — Encounter: Payer: Self-pay | Admitting: Physician Assistant

## 2017-05-03 VITALS — BP 130/82 | HR 61 | Temp 98.7°F | Resp 18 | Ht 67.0 in | Wt 130.8 lb

## 2017-05-03 DIAGNOSIS — R972 Elevated prostate specific antigen [PSA]: Secondary | ICD-10-CM

## 2017-05-03 DIAGNOSIS — N410 Acute prostatitis: Secondary | ICD-10-CM

## 2017-05-03 LAB — POCT URINALYSIS DIP (MANUAL ENTRY)
Bilirubin, UA: NEGATIVE
GLUCOSE UA: NEGATIVE mg/dL
Ketones, POC UA: NEGATIVE mg/dL
NITRITE UA: NEGATIVE
PROTEIN UA: NEGATIVE mg/dL
Spec Grav, UA: 1.01 (ref 1.010–1.025)
UROBILINOGEN UA: 0.2 U/dL
pH, UA: 6 (ref 5.0–8.0)

## 2017-05-03 LAB — POC MICROSCOPIC URINALYSIS (UMFC): Mucus: ABSENT

## 2017-05-03 NOTE — Progress Notes (Signed)
05/03/2017 6:31 PM   DOB: 04/05/1951 / MRN: [id number]  SUBJECTIVE:  James Mayo is a 66 y.o. male presenting for fever.  Was recently treated for UTI in the ED and was feeling much better however PSA was found to be elevated. Subsequent exam was negative for tenderness and PSA has been dropping.  He was advised to take cipro in the event that he felt his symptoms were returning, namely dysuria, fever, low back pain, or other urinary symptoms.   He is allergic to no known allergies.   He  has a past medical history of Aortic valve disorder (03/06/2017); BASAL CELL CARCINOMA, FACE (08/19/2009); BASAL CELL CARCINOMA, FACE (08/19/2009); Coronary artery disease; Metatarsalgia of left foot (04/29/2014); Neck pain, bilateral (11/08/2013); PAF (paroxysmal atrial fibrillation) ([place]) (03/09/2017); RHINITIS, ALLERGIC (11/09/2006); SHOULDER PAIN, RIGHT (05/06/2009); Thoracic ascending aortic aneurysm ([place]); and Thyroid condition.    He  reports that he has never smoked. He has never used smokeless tobacco. He reports that he drinks alcohol. He reports that he does not use drugs. He  has no sexual activity history on file. The patient  has a past surgical history that includes Eye surgery (Bilateral); RIGHT/LEFT HEART CATH AND CORONARY ANGIOGRAPHY (N/A, 01/25/2017); Cardiac catheterization; Replacement ascending aorta (N/A, 03/06/2017); TEE without cardioversion (N/A, 03/06/2017); Aortic valve replacement (03/06/2017); Cardioversion (N/A, 04/14/2017); and TEE without cardioversion (N/A, 04/14/2017).  His family history includes Hypertension in his mother; Mental illness in his brother.  Review of Systems  Constitutional: Negative for chills, diaphoresis and fever.  Eyes: Negative.   Respiratory: Negative for cough, hemoptysis, sputum production, shortness of breath and wheezing.   Cardiovascular: Negative for chest pain, orthopnea and leg swelling.  Gastrointestinal: Negative for nausea.  Skin: Negative for rash.    Neurological: Negative for dizziness, sensory change, speech change, focal weakness and headaches.    The problem list and medications were reviewed and updated by myself where necessary and exist elsewhere in the encounter.   OBJECTIVE:  BP 130/82   Pulse 61   Temp 98.7 F (37.1 C) (Oral)   Resp 18   Ht 5\' 7"  (1.702 m)   Wt 130 lb 12.8 oz (59.3 kg)   SpO2 97%   BMI 20.49 kg/m   Physical Exam  Constitutional: He appears well-developed. He is active and cooperative.  Non-toxic appearance.  Cardiovascular: Normal rate, regular rhythm, S1 normal, S2 normal, normal heart sounds, intact distal pulses and normal pulses.  Exam reveals no gallop and no friction rub.   No murmur heard. Pulmonary/Chest: Effort normal. No stridor. No tachypnea. No respiratory distress. He has no wheezes. He has no rales.  Abdominal: He exhibits no distension.  Musculoskeletal: He exhibits no edema.  Neurological: He is alert.  Skin: Skin is warm and dry. He is not diaphoretic. No pallor.  Vitals reviewed.   Results for orders placed or performed in visit on 05/03/17 (from the past 72 hour(s))  POCT urinalysis dipstick     Status: Abnormal   Collection Time: 05/03/17  5:03 PM  Result Value Ref Range   Color, UA yellow yellow   Clarity, UA clear clear   Glucose, UA negative negative mg/dL   Bilirubin, UA negative negative   Ketones, POC UA negative negative mg/dL   Spec Grav, UA 1.010 1.010 - 1.025   Blood, UA trace-intact (A) negative   pH, UA 6.0 5.0 - 8.0   Protein Ur, POC negative negative mg/dL   Urobilinogen, UA 0.2 0.2 or 1.0 E.U./dL  Nitrite, UA Negative Negative   Leukocytes, UA Trace (A) Negative   No results found.  ASSESSMENT AND PLAN:  Syaire was seen today for follow-up.  Diagnoses and all orders for this visit:  Acute prostatitis: Symptomatically resolving.  Will continue to hold cipro.  -     POCT urinalysis dipstick -     Urine Culture  Elevated PSA -     PSA;  Future    The patient is advised to call or return to clinic if he does not see an improvement in symptoms, or to seek the care of the closest emergency department if he worsens with the above plan.   Philis Fendt, MHS, PA-C [facility] 05/03/2017 6:31 PM

## 2017-05-03 NOTE — Patient Instructions (Signed)
     IF you received an x-ray today, you will receive an invoice from [facility]. Please contact [facility] at [phone] with questions or concerns regarding your invoice.   IF you received labwork today, you will receive an invoice from [facility]. Please contact [facility] at [phone] with questions or concerns regarding your invoice.   Our billing staff will not be able to assist you with questions regarding bills from these companies.  You will be contacted with the lab results as soon as they are available. The fastest way to get your results is to activate your My Chart account. Instructions are located on the last page of this paperwork. If you have not heard from us regarding the results in 2 weeks, please contact this office.     

## 2017-05-04 ENCOUNTER — Ambulatory Visit: Payer: 59 | Admitting: Physician Assistant

## 2017-05-04 LAB — URINE CULTURE

## 2017-05-04 NOTE — Progress Notes (Signed)
Please advise 

## 2017-05-04 NOTE — Progress Notes (Signed)
James Mayo can you give me a call at [phone].  He is now growing bacteria in his urine. He will be traveling in the middle of next month so we have some time to get his INR settled before he leaves.  He is very compliant. Philis Fendt, MS, PA-C 3:04 PM, 05/04/2017

## 2017-05-05 MED FILL — WARFARIN SODIUM 5 MG TABLET: 5 | 90 days supply | Qty: 100 | Fill #0

## 2017-05-08 ENCOUNTER — Ambulatory Visit (INDEPENDENT_AMBULATORY_CARE_PROVIDER_SITE_OTHER): Payer: 59 | Admitting: *Deleted

## 2017-05-08 DIAGNOSIS — I480 Paroxysmal atrial fibrillation: Secondary | ICD-10-CM | POA: Diagnosis not present

## 2017-05-08 LAB — POCT INR: INR: 2

## 2017-05-16 DIAGNOSIS — Z23 Encounter for immunization: Secondary | ICD-10-CM | POA: Diagnosis not present

## 2017-05-16 DIAGNOSIS — L821 Other seborrheic keratosis: Secondary | ICD-10-CM | POA: Diagnosis not present

## 2017-05-16 DIAGNOSIS — D485 Neoplasm of uncertain behavior of skin: Secondary | ICD-10-CM | POA: Diagnosis not present

## 2017-05-16 DIAGNOSIS — L570 Actinic keratosis: Secondary | ICD-10-CM | POA: Diagnosis not present

## 2017-05-16 DIAGNOSIS — Z85828 Personal history of other malignant neoplasm of skin: Secondary | ICD-10-CM | POA: Diagnosis not present

## 2017-05-16 DIAGNOSIS — C44612 Basal cell carcinoma of skin of right upper limb, including shoulder: Secondary | ICD-10-CM | POA: Diagnosis not present

## 2017-05-23 ENCOUNTER — Ambulatory Visit (INDEPENDENT_AMBULATORY_CARE_PROVIDER_SITE_OTHER): Payer: 59 | Admitting: Sports Medicine

## 2017-05-23 ENCOUNTER — Ambulatory Visit (INDEPENDENT_AMBULATORY_CARE_PROVIDER_SITE_OTHER): Payer: 59 | Admitting: *Deleted

## 2017-05-23 DIAGNOSIS — I480 Paroxysmal atrial fibrillation: Secondary | ICD-10-CM

## 2017-05-23 DIAGNOSIS — S2241XD Multiple fractures of ribs, right side, subsequent encounter for fracture with routine healing: Secondary | ICD-10-CM | POA: Diagnosis not present

## 2017-05-23 DIAGNOSIS — G2589 Other specified extrapyramidal and movement disorders: Secondary | ICD-10-CM | POA: Diagnosis not present

## 2017-05-23 DIAGNOSIS — Z5181 Encounter for therapeutic drug level monitoring: Secondary | ICD-10-CM | POA: Diagnosis not present

## 2017-05-23 LAB — POCT INR: INR: 1.4
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
## 2017-05-23 NOTE — Assessment & Plan Note (Signed)
Clinically much improved

## 2017-05-23 NOTE — Assessment & Plan Note (Signed)
See HEP as listed in OV

## 2017-05-23 NOTE — Assessment & Plan Note (Signed)
In NSR today  Has had cardioversion

## 2017-05-24 ENCOUNTER — Encounter: Payer: Self-pay | Admitting: Cardiology

## 2017-05-25 ENCOUNTER — Ambulatory Visit (INDEPENDENT_AMBULATORY_CARE_PROVIDER_SITE_OTHER): Payer: 59 | Admitting: Urgent Care

## 2017-05-25 ENCOUNTER — Other Ambulatory Visit: Payer: Self-pay | Admitting: Physician Assistant

## 2017-05-25 DIAGNOSIS — R972 Elevated prostate specific antigen [PSA]: Secondary | ICD-10-CM | POA: Diagnosis not present

## 2017-05-25 DIAGNOSIS — N419 Inflammatory disease of prostate, unspecified: Secondary | ICD-10-CM

## 2017-05-25 NOTE — Progress Notes (Signed)
Lab only encounter.  Philis Fendt, MS, PA-C 10:41 AM, 05/25/2017

## 2017-05-26 ENCOUNTER — Other Ambulatory Visit: Payer: Self-pay | Admitting: Physician Assistant

## 2017-05-26 ENCOUNTER — Encounter: Payer: Self-pay | Admitting: Physician Assistant

## 2017-05-26 DIAGNOSIS — N411 Chronic prostatitis: Secondary | ICD-10-CM

## 2017-05-26 LAB — PSA: PROSTATE SPECIFIC AG, SERUM: 1.3 ng/mL (ref 0.0–4.0)

## 2017-05-26 MED ORDER — CIPROFLOXACIN HCL 500 MG PO TABS: 500.0000 mg | ORAL_TABLET | Freq: Two times a day (BID) | ORAL | 0 refills | Status: AC

## 2017-05-26 MED FILL — CIPROFLOXACIN HCL 500 MG TA: 500 | 21 days supply | Qty: 42 | Fill #0

## 2017-05-31 ENCOUNTER — Ambulatory Visit (INDEPENDENT_AMBULATORY_CARE_PROVIDER_SITE_OTHER): Payer: 59 | Admitting: *Deleted

## 2017-05-31 DIAGNOSIS — I480 Paroxysmal atrial fibrillation: Secondary | ICD-10-CM | POA: Diagnosis not present

## 2017-05-31 DIAGNOSIS — Z5181 Encounter for therapeutic drug level monitoring: Secondary | ICD-10-CM | POA: Diagnosis not present

## 2017-05-31 LAB — POCT INR: INR: 1.8

## 2017-06-26 ENCOUNTER — Ambulatory Visit (INDEPENDENT_AMBULATORY_CARE_PROVIDER_SITE_OTHER): Payer: 59 | Admitting: *Deleted

## 2017-06-26 DIAGNOSIS — Z5181 Encounter for therapeutic drug level monitoring: Secondary | ICD-10-CM | POA: Diagnosis not present

## 2017-06-26 DIAGNOSIS — I480 Paroxysmal atrial fibrillation: Secondary | ICD-10-CM

## 2017-06-26 LAB — POCT INR: INR: 1.9
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
## 2017-06-28 ENCOUNTER — Ambulatory Visit (INDEPENDENT_AMBULATORY_CARE_PROVIDER_SITE_OTHER): Payer: 59 | Admitting: Interventional Cardiology

## 2017-06-28 ENCOUNTER — Telehealth (HOSPITAL_COMMUNITY): Payer: Self-pay | Admitting: Interventional Cardiology

## 2017-06-28 ENCOUNTER — Encounter: Payer: Self-pay | Admitting: Interventional Cardiology

## 2017-06-28 ENCOUNTER — Telehealth: Payer: Self-pay

## 2017-06-28 VITALS — BP 164/90 | HR 54 | Ht 67.0 in | Wt 136.0 lb

## 2017-06-28 DIAGNOSIS — I351 Nonrheumatic aortic (valve) insufficiency: Secondary | ICD-10-CM | POA: Diagnosis not present

## 2017-06-28 DIAGNOSIS — I480 Paroxysmal atrial fibrillation: Secondary | ICD-10-CM

## 2017-06-28 DIAGNOSIS — I712 Thoracic aortic aneurysm, without rupture, unspecified: Secondary | ICD-10-CM

## 2017-06-28 DIAGNOSIS — Z952 Presence of prosthetic heart valve: Secondary | ICD-10-CM

## 2017-06-28 NOTE — Patient Instructions (Signed)
Medication Instructions:  Your physician has recommended you make the following change in your medication:   1. STOP coumadin  2. IN ONE WEEK, You may STOP metoprolol  Labwork: None ordered  Testing/Procedures: None ordered  Follow-Up: Your physician wants you to follow-up in: 1 year with Dr. Irish Lack. You will receive a reminder letter in the mail two months in advance. If you don't receive a letter, please call our office to schedule the follow-up appointment.   Any Other Special Instructions Will Be Listed Below (If Applicable).  Continue to monitor your Blood Pressure at home.   If you need a refill on your cardiac medications before your next appointment, please call your pharmacy.

## 2017-06-28 NOTE — Telephone Encounter (Signed)
Follow up ° ° °Pt is returning call to nurse.  °

## 2017-06-28 NOTE — Telephone Encounter (Signed)
Left message for patient to call back.   Need to let patient know that Dr. Irish Lack also wants him to have an echocardiogram done.

## 2017-06-28 NOTE — Telephone Encounter (Signed)
Made patient aware that Dr. Irish Lack wants him to have a routine echocardiogram done per protocol s/p AVR. Made patient aware that he would be contacted to be scheduled. Patient verbalized understanding.

## 2017-06-30 NOTE — Telephone Encounter (Signed)
User: Cherie Dark A Date/time: 06/28/17 3:30 PM  Comment: Called pt and lmsg for him to CB to get scheduled for echo.   Context:  Outcome: Left Message  Phone number: [phone] Phone Type: Home Phone  Comm. type: Telephone Call type: Outgoing  Contact: [place] Relation to patient: Self

## 2017-07-04 DIAGNOSIS — C44612 Basal cell carcinoma of skin of right upper limb, including shoulder: Secondary | ICD-10-CM | POA: Diagnosis not present

## 2017-07-05 ENCOUNTER — Other Ambulatory Visit (HOSPITAL_COMMUNITY): Payer: 59

## 2017-07-07 ENCOUNTER — Ambulatory Visit (HOSPITAL_COMMUNITY): Payer: 59 | Attending: Interventional Cardiology

## 2017-07-07 ENCOUNTER — Other Ambulatory Visit: Payer: Self-pay

## 2017-07-07 DIAGNOSIS — Z952 Presence of prosthetic heart valve: Secondary | ICD-10-CM | POA: Insufficient documentation

## 2017-07-12 ENCOUNTER — Other Ambulatory Visit: Payer: Self-pay

## 2017-07-27 ENCOUNTER — Other Ambulatory Visit: Payer: Self-pay

## 2017-07-27 ENCOUNTER — Ambulatory Visit: Payer: 59 | Admitting: Cardiothoracic Surgery

## 2017-07-27 ENCOUNTER — Encounter: Payer: Self-pay | Admitting: Cardiothoracic Surgery

## 2017-07-27 VITALS — BP 142/88 | HR 57 | Ht 67.0 in | Wt 136.0 lb

## 2017-07-27 DIAGNOSIS — Z952 Presence of prosthetic heart valve: Secondary | ICD-10-CM | POA: Diagnosis not present

## 2017-07-27 DIAGNOSIS — I712 Thoracic aortic aneurysm, without rupture, unspecified: Secondary | ICD-10-CM

## 2017-07-27 DIAGNOSIS — Z95828 Presence of other vascular implants and grafts: Secondary | ICD-10-CM | POA: Diagnosis not present

## 2017-07-27 MED FILL — LEVOTHYROXINE 75 MCG TABLET: 75 | 90 days supply | Qty: 90 | Fill #1

## 2017-07-27 NOTE — Progress Notes (Signed)
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 SURGICAL PROCEDURES:  Aortic valve replacement with [facility], pericardial tissue valve, model [id number] 25 mm, serial #[id number], and replacement of the ascending aorta with hypothermic circulatory arrest- James Mayo Procedure   TEE with cardioversion done August 3   History of Present Illness:     Since last seen the patient has remained in sinus rhythm is now off Coumadin.  He notes his continues to increase his physical activity without shortness of breath or chest pain.     Past Medical History:  Diagnosis Date  . Aortic valve disorder 03/06/2017    S/p AVR with James Mayo pericardial tissue valve and replacement of the ascending aorta with a 32 mm Hemashield Side Arm Graft  . BASAL CELL CARCINOMA, FACE 08/19/2009   Qualifier: Diagnosis of By: James Alar Mayo, James Mayo    . BASAL CELL CARCINOMA, FACE 08/19/2009   Qualifier: Diagnosis of  By: James Alar Mayo, James Mayo    . Coronary artery disease   . Metatarsalgia of left foot 04/29/2014  . Neck pain, bilateral 11/08/2013  . PAF (paroxysmal atrial fibrillation) ([place]) 03/09/2017  . RHINITIS, ALLERGIC 11/09/2006   Qualifier: Diagnosis of  By: James Mayo    . SHOULDER PAIN, RIGHT 05/06/2009   Qualifier: Diagnosis of  By: James Alar Mayo, James Mayo    . Thoracic ascending aortic aneurysm ([place])   . Thyroid condition      Social History   Tobacco Use  Smoking Status Never Smoker  Smokeless Tobacco Never Used    Social History   Substance and  Sexual Activity  Alcohol Use Yes   Comment: 2 beers - 2-3 times/ weeek     Allergies  Allergen Reactions  . No Known Allergies     Current Outpatient Medications  Medication Sig Dispense Refill  . aspirin EC 81 MG tablet Take 1 tablet (81 mg total) by mouth daily. 90 tablet 3  . levothyroxine (SYNTHROID) 75 MCG tablet Take 1 tablet (75 mcg total) by mouth daily before breakfast. 90 tablet 1   No current facility-administered medications for this visit.        Physical Exam: BP (!) 142/88   Pulse (!) 57   Ht 5\' 7"  (1.702 m)   Wt 136 lb (61.7 kg)   SpO2 98%   BMI 21.30 kg/m   General appearance: alert, cooperative, appears stated age and no distress Neurologic: intact Heart: regular rate and rhythm, S1, S2 normal, early systolic murmur 2/6, click, rub or gallop Lungs: clear to auscultation bilaterally Abdomen: soft, non-tender; bowel sounds normal; no masses,  no organomegaly Extremities: extremities normal, atraumatic, no cyanosis or edema and Homans sign is negative, no sign of DVT Wound: Sternum is stable and well-healed   Diagnostic Studies & Laboratory data:      Recent Lab Findings: Lab Results  Component Value Date   WBC 6.8 04/28/2017   HGB 11.8 (L) 04/28/2017   HCT 39.4 04/28/2017   PLT 363 04/28/2017   GLUCOSE 116 (  H) 04/20/2017   CHOL 195 08/25/2016   TRIG 44 08/25/2016   HDL 101 08/25/2016   LDLCALC 85 08/25/2016   ALT 24 04/20/2017   AST 27 04/20/2017   NA 138 04/20/2017   K 3.9 04/20/2017   CL 105 04/20/2017   CREATININE 0.81 04/20/2017   BUN 28 (H) 04/20/2017   CO2 26 04/20/2017   TSH 3.860 04/26/2017   INR 1.9 06/26/2017   HGBA1C 5.5 03/02/2017   Procedure: TEE  Indication: Atrial fibrillation  Sedation: Per anesthesiology  Findings: Please see echo section for full report.  Trivial pericardial effusion.  Normal LV size with mild LV hypertrophy.  EF 60-65%, no regional wall motion abnormalities.  Normal RV size and systolic  function.  Moderate left atrial enlargement, no LA appendage thrombus.  Mild right atrial enlargement.  No PFO or ASD by color doppler.  Trivial TR with peak RV-RA gradient 20 mmHg.  There was mild prolapse of the anterior mitral leaflet with mild MR.  There was a bioprosthetic aortic valve with trivial AI and mean gradient 10 mmHg (no stenosis).  Normal caliber aorta with minimal plaque.   Proceed to DCCV.   James Mayo 04/14/2017 10:36 AM  Recent echocardiogram:  [facility]                        [address] [place], [place] [address]                            [phone]  ------------------------------------------------------------------- Transthoracic Echocardiography  Patient:    James Mayo MR #:       [id number] Study Date: 07/07/2017 Gender:     M Age:        66 Height:     170.2 cm Weight:     61.7 kg BSA:        1.71 m^2 Pt. Status: Room:   James Mayo  SONOGRAPHER  James Mayo, RDCS  ORDERING     [place] [place], Outpatient  cc:  ------------------------------------------------------------------- LV EF: 50% -   55%  ------------------------------------------------------------------- Indications:      AVR (Z95.2).  ------------------------------------------------------------------- History:   PMH:   Atrial fibrillation.  Aortic valve disease.  Risk factors:  Thoracic aortic aneurysm.  ------------------------------------------------------------------- Study Conclusions  - Left ventricle: The cavity size was normal. Wall thickness was   increased in a pattern of moderate LVH. There was focal basal   hypertrophy. Systolic function was normal. The estimated ejection   fraction was in the range of 50% to 55%. Features are consistent   with a pseudonormal left ventricular filling pattern, with   concomitant abnormal relaxation and  increased filling pressure   (grade 2 diastolic dysfunction). - Aortic valve: A bioprosthesis was present. There was mild   regurgitation. - Mitral valve: Mild prolapse, involving the anterior leaflet.   There was mild regurgitation. - Left atrium: The atrium was moderately dilated. - Right ventricle: The cavity size was normal. Wall thickness was   mildly to moderately increased. Systolic function was mildly   reduced. - Right atrium: The atrium was moderately dilated.  ------------------------------------------------------------------- Labs, prior tests, procedures, and surgery: Transthoracic echocardiography (01/16/2017).  EF was 55% and PA pressure was 35 (systolic).  Transesophageal echocardiography (04/14/2017).     EF was 60%. Valve surgery.     Aortic valve replacement with a bioprosthetic valve. Aortic repair.  ------------------------------------------------------------------- Study data:  Comparison was made to the study of 04/14/2017.  Study status:  Routine.  Procedure:  The patient reported no pain pre or post test. Transthoracic echocardiography. Image quality was adequate.  Study completion:  There were no complications. Transthoracic echocardiography.  M-mode, complete 2D, spectral Doppler, and color Doppler.  Birthdate:  Patient birthdate: Jan 31, 1951.  Age:  Patient is 66 yr old.  Sex:  Gender: male. BMI: 21.3 kg/m^2.  Blood pressure:     164/90  Patient status: Outpatient.  Study date:  Study date: 07/07/2017. Study time: 01:53 PM.  Location:  [facility]  -------------------------------------------------------------------  ------------------------------------------------------------------- Left ventricle:  The cavity size was normal. Wall thickness was increased in a pattern of moderate LVH. There was focal basal hypertrophy. Systolic function was normal. The estimated ejection fraction was in the range of 50% to 55%. Features are  consistent with a pseudonormal left ventricular filling pattern, with concomitant abnormal relaxation and increased filling pressure (grade 2 diastolic dysfunction).  ------------------------------------------------------------------- Aortic valve:  A bioprosthesis was present.  Doppler:  There was mild regurgitation.    VTI ratio of LVOT to aortic valve: 0.49. Peak velocity ratio of LVOT to aortic valve: 0.47. Mean velocity ratio of LVOT to aortic valve: 0.48.    Mean gradient (S): 20 mm Hg. Peak gradient (S): 32 mm Hg.  ------------------------------------------------------------------- Aorta:  The aorta was moderately dilated.  ------------------------------------------------------------------- Mitral valve:   Mild prolapse, involving the anterior leaflet. Doppler:  There was mild regurgitation.  ------------------------------------------------------------------- Left atrium:  biplane volume measurements of the LA were no done. The left atrium appears to be moderately dilated. The atrium was moderately dilated.  ------------------------------------------------------------------- Right ventricle:  The cavity size was normal. Wall thickness was mildly to moderately increased. Systolic function was mildly reduced.  ------------------------------------------------------------------- Pulmonic valve:    The valve appears to be grossly normal. Doppler:  There was no significant regurgitation.  ------------------------------------------------------------------- Tricuspid valve:   The valve appears to be grossly normal. Doppler:  There was mild regurgitation.  ------------------------------------------------------------------- Pulmonary artery:   Systolic pressure was within the normal range.   ------------------------------------------------------------------- Right atrium:  The atrium was moderately  dilated.  ------------------------------------------------------------------- Pericardium:  There was no pericardial effusion.   ------------------------------------------------------------------- Post procedure conclusions Ascending Aorta:  - The aorta was moderately dilated.  ------------------------------------------------------------------- Measurements   Left ventricle                           Value        Reference  LV ID, ED, PLAX chordal          (L)     41.9  mm     43 - 52  LV ID, ES, PLAX chordal                  29.8  mm     23 - 38  LV fx shortening, PLAX chordal           29    %      >=29  LV PW thickness, ED                      9.82  mm     ---------  IVS/LV PW ratio, ED              (H)     1.42         <=1.3  LV e&', lateral                           12.3  cm/s   ---------  LV E/e&', lateral                         5.72         ---------  LV e&', medial                            6.98  cm/s   ---------  LV E/e&', medial                          10.09        ---------  LV e&', average                           9.64  cm/s   ---------  LV E/e&', average                         7.3          ---------    Ventricular septum                       Value        Reference  IVS thickness, ED                        13.9  mm     ---------    LVOT                                     Value        Reference  LVOT peak velocity, S                    134   cm/s   ---------  LVOT mean velocity, S                    99.3  cm/s   ---------  LVOT VTI, S                              31.2  cm     ---------  LVOT peak gradient, S                    7     mm Hg  ---------    Aortic valve                             Value        Reference  Aortic valve peak velocity, S            285   cm/s   ---------  Aortic valve mean velocity, S            209   cm/s   ---------  Aortic valve VTI, S                      63.2  cm     ---------  Aortic mean gradient, S                  20    mm  Hg  ---------  Aortic peak gradient, S                  32    mm Hg  ---------  VTI ratio, LVOT/AV                       0.49         ---------  Velocity ratio, peak, LVOT/AV            0.47         ---------  Velocity ratio, mean, LVOT/AV            0.48         ---------  Aortic regurg pressure half-time         667   ms     ---------    Aorta                                    Value        Reference  Aortic root ID, ED                       48    mm     ---------  Ascending aorta ID, A-P, S               32    mm     ---------    Left atrium                              Value        Reference  LA ID, A-P, ES                           47    mm     ---------  LA ID/bsa, A-P                   (H)     2.75  cm/m^2 <=2.2  LA volume, ES, 1-p A4C                   79.1  ml     ---------  LA volume/bsa, ES, 1-p A4C               46.4  ml/m^2 ---------    Mitral valve                             Value        Reference  Mitral E-wave peak velocity              70.4  cm/s   ---------  Mitral A-wave peak velocity              51.1  cm/s   ---------  Mitral deceleration time         (H)     253  ms     150 - 230  Mitral E/A ratio, peak                   1.4          ---------    Pulmonary arteries                       Value        Reference  PA pressure, S, DP                       22    mm Hg  <=30    Tricuspid valve                          Value        Reference  Tricuspid regurg peak velocity           220   cm/s   ---------  Tricuspid peak RV-RA gradient            19    mm Hg  ---------    Systemic veins                           Value        Reference  Estimated CVP                            3     mm Hg  ---------    Right ventricle                          Value        Reference  TAPSE                                    13.1  mm     ---------  RV pressure, S, DP                       22    mm Hg  <=30  RV s&', lateral, S                        7.13  cm/s    ---------  Legend: (L)  and  (H)  mark values outside specified reference range.  ------------------------------------------------------------------- Prepared and Electronically Authenticated by  Mertie Moores, M.D. 2018-10-26T15:33:18   Assessment / Plan:   Patient doing well following recent aortic valve replacement and replacement of a sending aorta Patient is to return in approximately 6 months with a follow-up CTA of the chest Again reviewed with him the need for dental and other prophylaxis with his new heart valve.   Grace Isaac Mayo      [address] [place] [address] Office [phone]   Beeper [phone]  07/27/2017 2:35 PM

## 2017-08-18 ENCOUNTER — Ambulatory Visit (INDEPENDENT_AMBULATORY_CARE_PROVIDER_SITE_OTHER): Payer: 59

## 2017-08-18 ENCOUNTER — Ambulatory Visit: Payer: Self-pay

## 2017-08-18 ENCOUNTER — Encounter: Payer: Self-pay | Admitting: Physician Assistant

## 2017-08-18 ENCOUNTER — Other Ambulatory Visit: Payer: Self-pay

## 2017-08-18 ENCOUNTER — Ambulatory Visit (INDEPENDENT_AMBULATORY_CARE_PROVIDER_SITE_OTHER): Payer: 59 | Admitting: Physician Assistant

## 2017-08-18 VITALS — BP 132/68 | HR 62 | Temp 98.2°F | Resp 16 | Ht 67.0 in | Wt 137.8 lb

## 2017-08-18 DIAGNOSIS — Z23 Encounter for immunization: Secondary | ICD-10-CM | POA: Diagnosis not present

## 2017-08-18 DIAGNOSIS — R42 Dizziness and giddiness: Secondary | ICD-10-CM

## 2017-08-18 LAB — POCT URINALYSIS DIP (MANUAL ENTRY)
BILIRUBIN UA: NEGATIVE
GLUCOSE UA: NEGATIVE mg/dL
Ketones, POC UA: NEGATIVE mg/dL
Leukocytes, UA: NEGATIVE
Nitrite, UA: NEGATIVE
Protein Ur, POC: NEGATIVE mg/dL
SPEC GRAV UA: 1.015 (ref 1.010–1.025)
UROBILINOGEN UA: 0.2 U/dL
pH, UA: 6 (ref 5.0–8.0)

## 2017-08-18 LAB — POCT CBC
GRANULOCYTE PERCENT: 78.9 % (ref 37–80)
HEMATOCRIT: 39.4 % — AB (ref 43.5–53.7)
Hemoglobin: 12.7 g/dL — AB (ref 14.1–18.1)
Lymph, poc: 1 (ref 0.6–3.4)
MCH: 28.3 pg (ref 27–31.2)
MCHC: 32.3 g/dL (ref 31.8–35.4)
MCV: 87.6 fL (ref 80–97)
MID (CBC): 0.3 (ref 0–0.9)
MPV: 8.6 fL (ref 0–99.8)
PLATELET COUNT, POC: 173 10*3/uL (ref 142–424)
POC GRANULOCYTE: 4.7 (ref 2–6.9)
POC LYMPH %: 16.8 % (ref 10–50)
POC MID %: 4.3 % (ref 0–12)
RBC: 4.5 M/uL — AB (ref 4.69–6.13)
RDW, POC: 18.1 %
WBC: 6 10*3/uL (ref 4.6–10.2)

## 2017-08-18 MED ORDER — FLUTICASONE PROPIONATE 50 MCG/ACT NA SUSP: 2.0000 | Freq: Every day | NASAL | 6 refills | Status: DC

## 2017-08-18 MED ORDER — MECLIZINE HCL 25 MG PO TABS: 25.0000 mg | ORAL_TABLET | Freq: Three times a day (TID) | ORAL | 0 refills | Status: DC | PRN

## 2017-08-18 MED FILL — MECLIZINE 25 MG TABLET: 25 | 10 days supply | Qty: 30 | Fill #0

## 2017-08-18 MED FILL — FLUTICASONE PROP 50 MCG SPR: 50 | 30 days supply | Qty: 16 | Fill #0

## 2017-08-18 NOTE — Patient Instructions (Signed)
     IF you received an x-ray today, you will receive an invoice from [facility]. Please contact [facility] at [phone] with questions or concerns regarding your invoice.   IF you received labwork today, you will receive an invoice from [facility]. Please contact [facility] at [phone] with questions or concerns regarding your invoice.   Our billing staff will not be able to assist you with questions regarding bills from these companies.  You will be contacted with the lab results as soon as they are available. The fastest way to get your results is to activate your My Chart account. Instructions are located on the last page of this paperwork. If you have not heard from us regarding the results in 2 weeks, please contact this office.     

## 2017-08-18 NOTE — Progress Notes (Signed)
08/18/2017 2:15 PM   DOB: 14-Apr-1951 / MRN: [id number]  SUBJECTIVE:  James Mayo is a 66 y.o. male presenting for lightheadedness that has been occurring for the last 7 days.   Notes this gets worse with walking.  He denies any other symptoms.  He is S/P ascending aortic replacement and did require ablation for afib post surgery.  Otherwise he has had no complications.   He is no worse or better. He denies CP, SOB, nausea, diaphoresis.   He is allergic to no known allergies.   He  has a past medical history of Aortic valve disorder (03/06/2017), BASAL CELL CARCINOMA, FACE (08/19/2009), BASAL CELL CARCINOMA, FACE (08/19/2009), Coronary artery disease, Metatarsalgia of left foot (04/29/2014), Neck pain, bilateral (11/08/2013), PAF (paroxysmal atrial fibrillation) ([place]) (03/09/2017), RHINITIS, ALLERGIC (11/09/2006), SHOULDER PAIN, RIGHT (05/06/2009), Thoracic ascending aortic aneurysm ([place]), and Thyroid condition.    He  reports that  has never smoked. he has never used smokeless tobacco. He reports that he drinks alcohol. He reports that he does not use drugs. He  has no sexual activity history on file. The patient  has a past surgical history that includes Eye surgery (Bilateral); RIGHT/LEFT HEART CATH AND CORONARY ANGIOGRAPHY (N/A, 01/25/2017); Cardiac catheterization; Replacement ascending aorta (N/A, 03/06/2017); TEE without cardioversion (N/A, 03/06/2017); Aortic valve replacement (03/06/2017); Cardioversion (N/A, 04/14/2017); and TEE without cardioversion (N/A, 04/14/2017).  His family history includes Hypertension in his mother; Mental illness in his brother.  Review of Systems  Constitutional: Negative for chills, diaphoresis, fever, malaise/fatigue and weight loss.  HENT: Negative for sore throat.   Respiratory: Negative for cough.   Cardiovascular: Negative for chest pain.  Gastrointestinal: Negative for nausea.  Musculoskeletal: Negative for myalgias.  Skin: Negative for itching and rash.    Neurological: Positive for dizziness. Negative for tingling, tremors, sensory change, speech change, focal weakness, seizures, loss of consciousness, weakness and headaches.    The problem list and medications were reviewed and updated by myself where necessary and exist elsewhere in the encounter.   OBJECTIVE:  BP 132/68 (BP Location: Right Arm, Patient Position: Sitting, Cuff Size: Normal)   Pulse 62   Temp 98.2 F (36.8 C) (Oral)   Resp 16   Ht 5\' 7"  (1.702 m)   Wt 137 lb 12.8 oz (62.5 kg)   SpO2 100%   BMI 21.58 kg/m   Wt Readings from Last 3 Encounters:  08/18/17 137 lb 12.8 oz (62.5 kg)  07/27/17 136 lb (61.7 kg)  06/28/17 136 lb (61.7 kg)   Orthostatic VS for the past 24 hrs:  BP- Lying Pulse- Lying BP- Sitting Pulse- Sitting BP- Standing at 0 minutes Pulse- Standing at 0 minutes  08/18/17 1245 138/80 - 148/90 - 136/86 -      Physical Exam  Constitutional: He is oriented to person, place, and time. He appears well-developed. He is active and cooperative.  Non-toxic appearance.  Eyes: EOM are normal. Pupils are equal, round, and reactive to light.  Cardiovascular: Normal rate, regular rhythm, S1 normal, S2 normal, normal heart sounds, intact distal pulses and normal pulses. Exam reveals no gallop and no friction rub.  No murmur heard. Pulmonary/Chest: Effort normal. No stridor. No tachypnea. No respiratory distress. He has no wheezes. He has no rales.  Abdominal: He exhibits no distension.  Musculoskeletal: He exhibits no edema.  Neurological: He is alert and oriented to person, place, and time. He has normal strength and normal reflexes. He is not disoriented. He displays no atrophy, no  tremor and normal reflexes. No cranial nerve deficit or sensory deficit. He exhibits normal muscle tone. He displays a negative Romberg sign. He displays no seizure activity. Coordination and gait normal. GCS eye subscore is 4. GCS verbal subscore is 5. GCS motor subscore is 6.   Cerebellar testing normal.   Skin: Skin is warm and dry. He is not diaphoretic. No pallor.  Psychiatric: His behavior is normal.  Vitals reviewed.   Results for orders placed or performed in visit on 08/18/17 (from the past 72 hour(s))  POCT CBC     Status: Abnormal   Collection Time: 08/18/17 12:34 PM  Result Value Ref Range   WBC 6.0 4.6 - 10.2 K/uL   Lymph, poc 1.0 0.6 - 3.4   POC LYMPH PERCENT 16.8 10 - 50 %L   MID (cbc) 0.3 0 - 0.9   POC MID % 4.3 0 - 12 %M   POC Granulocyte 4.7 2 - 6.9   Granulocyte percent 78.9 37 - 80 %G   RBC 4.50 (A) 4.69 - 6.13 M/uL   Hemoglobin 12.7 (A) 14.1 - 18.1 g/dL   HCT, POC 39.4 (A) 43.5 - 53.7 %   MCV 87.6 80 - 97 fL   MCH, POC 28.3 27 - 31.2 pg   MCHC 32.3 31.8 - 35.4 g/dL   RDW, POC 18.1 %   Platelet Count, POC 173 142 - 424 K/uL   MPV 8.6 0 - 99.8 fL  POCT urinalysis dipstick     Status: Abnormal   Collection Time: 08/18/17 12:36 PM  Result Value Ref Range   Color, UA yellow yellow   Clarity, UA clear clear   Glucose, UA negative negative mg/dL   Bilirubin, UA negative negative   Ketones, POC UA negative negative mg/dL   Spec Grav, UA 1.015 1.010 - 1.025   Blood, UA trace-lysed (A) negative   pH, UA 6.0 5.0 - 8.0   Protein Ur, POC negative negative mg/dL   Urobilinogen, UA 0.2 0.2 or 1.0 E.U./dL   Nitrite, UA Negative Negative   Leukocytes, UA Negative Negative   CBC Latest Ref Rng & Units 08/18/2017 04/28/2017 04/26/2017  WBC 4.6 - 10.2 K/uL 6.0 6.8 6.5  Hemoglobin 14.1 - 18.1 g/dL 12.7(A) 11.8(L) 11.6(L)  Hematocrit 43.5 - 53.7 % 39.4(A) 39.4 37.2(L)  Platelets 150 - 379 x10E3/uL - 363 331    Dg Chest 2 View  Result Date: 08/18/2017 CLINICAL DATA:  Dizziness.  Recent ascending aortic aneurysm repair. EXAM: CHEST  2 VIEW COMPARISON:  Chest x-ray dated April 20, 2017. FINDINGS: The cardiomediastinal silhouette remains borderline enlarged in size. Unchanged median sternotomy wires and aortic valve replacement. Normal pulmonary  vascularity. The lungs remain mildly hyperinflated. No focal consolidation, pleural effusion, or pneumothorax. No acute osseous abnormality. Old right-sided rib fractures again noted. IMPRESSION: No active cardiopulmonary disease. Electronically Signed   By: Titus Dubin M.D.   On: 08/18/2017 12:39    ASSESSMENT AND PLAN:  Travarius was seen today for dizziness.  Diagnoses and all orders for this visit:  Intermittent lightheadedness: CBC improving.  Urine not dry.  Screening PSA given previous history of prostatitis. Chest and EKG normal for him however his complaint does sound cardiac in origin.  Advised that that he contact Dr. Lilia Argue and will try to get him seen there as well as he may benefit from a stress test given his complaint has an exertional component. Case discussed with Dr. Brigitte Pulse and she is in agreement.  Will try some  sinus measures in the event that this is vestibular in nature.  -     Flu Vaccine QUAD 36+ mos IM -     EKG 12-Lead -     POCT CBC -     TSH -     CMP and Liver -     Orthostatic vital signs -     POCT urinalysis dipstick -     PSA -     DG Chest 2 View; Future -     Brain natriuretic peptide -     fluticasone (FLONASE) 50 MCG/ACT nasal spray; Place 2 sprays into both nostrils daily. -     meclizine (ANTIVERT) 25 MG tablet; Take 1 tablet (25 mg total) by mouth 3 (three) times daily as needed for dizziness.    The patient is advised to call or return to clinic if he does not see an improvement in symptoms, or to seek the care of the closest emergency department if he worsens with the above plan.   Philis Fendt, MHS, PA-C [facility] 08/18/2017 2:15 PM

## 2017-08-18 NOTE — Telephone Encounter (Signed)
  Reason for Disposition . [1] MODERATE dizziness (e.g., interferes with normal activities) AND [2] has NOT been evaluated by physician for this  (Exception: dizziness caused by heat exposure, sudden standing, or poor fluid intake)  Answer Assessment - Initial Assessment Questions 1. DESCRIPTION: "Describe your dizziness."     It's more lightheadedness 2. LIGHTHEADED: "Do you feel lightheaded?" (e.g., somewhat faint, woozy, weak upon standing)     More with movement 3. VERTIGO: "Do you feel like either you or the room is spinning or tilting?" (i.e. vertigo)     No 4. SEVERITY: "How bad is it?"  "Do you feel like you are going to faint?" "Can you stand and walk?"   - MILD - walking normally   - MODERATE - interferes with normal activities (e.g., work, school)    - SEVERE - unable to stand, requires support to walk, feels like passing out now.      Mild to moderate 5. ONSET:  "When did the dizziness begin?"     Started 8 days. 6. AGGRAVATING FACTORS: "Does anything make it worse?" (e.g., standing, change in head position)     Not sure. Sitting still, does not feel diizy. 7. HEART RATE: "Can you tell me your heart rate?" "How many beats in 15 seconds?"  (Note: not all patients can do this)       Heart rate resting 55. BP 125/80 8. CAUSE: "What do you think is causing the dizziness?"     Unsure 9. RECURRENT SYMPTOM: "Have you had dizziness before?" If so, ask: "When was the last time?" "What happened that time?"     No 10. OTHER SYMPTOMS: "Do you have any other symptoms?" (e.g., fever, chest pain, vomiting, diarrhea, bleeding)       No 11. PREGNANCY: "Is there any chance you are pregnant?" "When was your last menstrual period?"       No  Protocols used: DIZZINESS - LIGHTHEADEDNESS-A-AH Pt. Has appointment for today. Reports no other symptoms.

## 2017-08-19 LAB — CMP AND LIVER
ALK PHOS: 57 IU/L (ref 39–117)
ALT: 14 IU/L (ref 0–44)
AST: 25 IU/L (ref 0–40)
Albumin: 4.3 g/dL (ref 3.6–4.8)
BILIRUBIN, DIRECT: 0.18 mg/dL (ref 0.00–0.40)
BUN: 23 mg/dL (ref 8–27)
Bilirubin Total: 0.6 mg/dL (ref 0.0–1.2)
CHLORIDE: 105 mmol/L (ref 96–106)
CO2: 25 mmol/L (ref 20–29)
Calcium: 9.2 mg/dL (ref 8.6–10.2)
Creatinine, Ser: 1.11 mg/dL (ref 0.76–1.27)
GFR calc Af Amer: 80 mL/min/{1.73_m2} (ref >59)
GFR calc non Af Amer: 69 mL/min/{1.73_m2} (ref >59)
Glucose: 92 mg/dL (ref 65–99)
Potassium: 4.7 mmol/L (ref 3.5–5.2)
SODIUM: 140 mmol/L (ref 134–144)
TOTAL PROTEIN: 6.4 g/dL (ref 6.0–8.5)

## 2017-08-19 LAB — TSH: TSH: 3.82 u[IU]/mL (ref 0.450–4.500)

## 2017-08-19 LAB — BRAIN NATRIURETIC PEPTIDE: BNP: 60.7 pg/mL (ref 0.0–100.0)

## 2017-08-19 LAB — PSA: PROSTATE SPECIFIC AG, SERUM: 1 ng/mL (ref 0.0–4.0)

## 2017-08-23 ENCOUNTER — Telehealth: Payer: Self-pay

## 2017-08-23 NOTE — Telephone Encounter (Signed)
Left message for patient to call back  

## 2017-08-23 NOTE — Telephone Encounter (Signed)
-----   Message from Jettie Booze, MD sent at 08/23/2017 10:35 AM EST -----  [place],  Please see how Mr. Crookshanks is doing from a dizziness standpoint.  JV ----- Message ----- From: Tereasa Coop, PA-C Sent: 08/18/2017   2:24 PM To: Jettie Booze, MD  Patient with intermittent lightheadedness worse with exertion over the past few days. No gross abnormalities on labs, imaging, and exam. No chest pain or SOB. Other labs pending but I don't have high hopes.  Would you mind having your staff give him a call so he can present to you with this problem?

## 2017-08-28 NOTE — Telephone Encounter (Signed)
Left message for patient to call back  

## 2017-08-29 DIAGNOSIS — L570 Actinic keratosis: Secondary | ICD-10-CM | POA: Diagnosis not present

## 2017-08-31 NOTE — Telephone Encounter (Signed)
I spoke with pt. He reports lightheadedness has improved. He has been OK for the last 11 days.  I told pt I would make Dr. Irish Lack aware.   Pt will follow up with Dr. Irish Lack as planned.  I told him to let us know if any other problems before then.

## 2017-08-31 NOTE — Telephone Encounter (Signed)
Left message for patient to call back  

## 2017-10-03 DIAGNOSIS — L570 Actinic keratosis: Secondary | ICD-10-CM | POA: Diagnosis not present

## 2017-10-03 DIAGNOSIS — L821 Other seborrheic keratosis: Secondary | ICD-10-CM | POA: Diagnosis not present

## 2017-10-15 ENCOUNTER — Encounter (INDEPENDENT_AMBULATORY_CARE_PROVIDER_SITE_OTHER): Payer: Self-pay

## 2017-10-26 DIAGNOSIS — M542 Cervicalgia: Secondary | ICD-10-CM | POA: Diagnosis not present

## 2017-10-26 DIAGNOSIS — M546 Pain in thoracic spine: Secondary | ICD-10-CM | POA: Diagnosis not present

## 2017-10-26 DIAGNOSIS — M545 Low back pain: Secondary | ICD-10-CM | POA: Diagnosis not present

## 2017-10-27 ENCOUNTER — Other Ambulatory Visit: Payer: Self-pay | Admitting: Physician Assistant

## 2017-10-27 DIAGNOSIS — E039 Hypothyroidism, unspecified: Secondary | ICD-10-CM

## 2017-10-27 MED FILL — LEVOTHYROXINE 75 MCG TABLET: 75 | 90 days supply | Qty: 90 | Fill #0

## 2017-10-31 DIAGNOSIS — L570 Actinic keratosis: Secondary | ICD-10-CM | POA: Diagnosis not present

## 2017-11-01 DIAGNOSIS — M542 Cervicalgia: Secondary | ICD-10-CM | POA: Diagnosis not present

## 2017-11-01 DIAGNOSIS — M546 Pain in thoracic spine: Secondary | ICD-10-CM | POA: Diagnosis not present

## 2017-11-01 DIAGNOSIS — M545 Low back pain: Secondary | ICD-10-CM | POA: Diagnosis not present

## 2017-11-03 DIAGNOSIS — M546 Pain in thoracic spine: Secondary | ICD-10-CM | POA: Diagnosis not present

## 2017-11-03 DIAGNOSIS — M542 Cervicalgia: Secondary | ICD-10-CM | POA: Diagnosis not present

## 2017-11-03 DIAGNOSIS — M545 Low back pain: Secondary | ICD-10-CM | POA: Diagnosis not present

## 2017-11-09 DIAGNOSIS — M545 Low back pain: Secondary | ICD-10-CM | POA: Diagnosis not present

## 2017-11-09 DIAGNOSIS — M542 Cervicalgia: Secondary | ICD-10-CM | POA: Diagnosis not present

## 2017-11-09 DIAGNOSIS — M546 Pain in thoracic spine: Secondary | ICD-10-CM | POA: Diagnosis not present

## 2017-11-13 DIAGNOSIS — M546 Pain in thoracic spine: Secondary | ICD-10-CM | POA: Diagnosis not present

## 2017-11-13 DIAGNOSIS — M545 Low back pain: Secondary | ICD-10-CM | POA: Diagnosis not present

## 2017-11-13 DIAGNOSIS — M542 Cervicalgia: Secondary | ICD-10-CM | POA: Diagnosis not present

## 2017-11-20 DIAGNOSIS — M546 Pain in thoracic spine: Secondary | ICD-10-CM | POA: Diagnosis not present

## 2017-11-20 DIAGNOSIS — M542 Cervicalgia: Secondary | ICD-10-CM | POA: Diagnosis not present

## 2017-11-20 DIAGNOSIS — M545 Low back pain: Secondary | ICD-10-CM | POA: Diagnosis not present

## 2017-11-27 DIAGNOSIS — M542 Cervicalgia: Secondary | ICD-10-CM | POA: Diagnosis not present

## 2017-11-27 DIAGNOSIS — M546 Pain in thoracic spine: Secondary | ICD-10-CM | POA: Diagnosis not present

## 2017-11-27 DIAGNOSIS — M545 Low back pain: Secondary | ICD-10-CM | POA: Diagnosis not present

## 2017-12-05 DIAGNOSIS — M545 Low back pain: Secondary | ICD-10-CM | POA: Diagnosis not present

## 2017-12-05 DIAGNOSIS — M542 Cervicalgia: Secondary | ICD-10-CM | POA: Diagnosis not present

## 2017-12-05 DIAGNOSIS — M546 Pain in thoracic spine: Secondary | ICD-10-CM | POA: Diagnosis not present

## 2017-12-14 DIAGNOSIS — M542 Cervicalgia: Secondary | ICD-10-CM | POA: Diagnosis not present

## 2017-12-14 DIAGNOSIS — M545 Low back pain: Secondary | ICD-10-CM | POA: Diagnosis not present

## 2017-12-14 DIAGNOSIS — M546 Pain in thoracic spine: Secondary | ICD-10-CM | POA: Diagnosis not present

## 2017-12-20 ENCOUNTER — Encounter: Payer: Self-pay | Admitting: Physician Assistant

## 2017-12-21 DIAGNOSIS — M545 Low back pain: Secondary | ICD-10-CM | POA: Diagnosis not present

## 2017-12-21 DIAGNOSIS — M542 Cervicalgia: Secondary | ICD-10-CM | POA: Diagnosis not present

## 2017-12-21 DIAGNOSIS — M546 Pain in thoracic spine: Secondary | ICD-10-CM | POA: Diagnosis not present

## 2017-12-26 DIAGNOSIS — M546 Pain in thoracic spine: Secondary | ICD-10-CM | POA: Diagnosis not present

## 2017-12-26 DIAGNOSIS — M542 Cervicalgia: Secondary | ICD-10-CM | POA: Diagnosis not present

## 2017-12-26 DIAGNOSIS — M545 Low back pain: Secondary | ICD-10-CM | POA: Diagnosis not present

## 2018-01-16 MED FILL — LEVOTHYROXINE 75 MCG TABLET: 75 | 90 days supply | Qty: 90 | Fill #1

## 2018-02-08 ENCOUNTER — Other Ambulatory Visit: Payer: Self-pay | Admitting: Cardiothoracic Surgery

## 2018-02-08 DIAGNOSIS — Z95828 Presence of other vascular implants and grafts: Secondary | ICD-10-CM

## 2018-03-06 ENCOUNTER — Ambulatory Visit: Payer: 59 | Admitting: Cardiothoracic Surgery

## 2018-03-06 ENCOUNTER — Ambulatory Visit
Admission: RE | Admit: 2018-03-06 | Discharge: 2018-03-06 | Disposition: A | Discharge status: Home / Self Care | Payer: 59 | Source: Ambulatory Visit | Attending: Cardiothoracic Surgery | Admitting: Cardiothoracic Surgery

## 2018-03-06 VITALS — BP 140/78 | HR 52 | Resp 20 | Ht 67.0 in | Wt 135.0 lb

## 2018-03-06 DIAGNOSIS — Z95828 Presence of other vascular implants and grafts: Secondary | ICD-10-CM

## 2018-03-06 DIAGNOSIS — Z952 Presence of prosthetic heart valve: Secondary | ICD-10-CM | POA: Diagnosis not present

## 2018-03-06 DIAGNOSIS — I712 Thoracic aortic aneurysm, without rupture, unspecified: Secondary | ICD-10-CM

## 2018-03-06 MED ORDER — IOPAMIDOL (ISOVUE-370) INJECTION 76%
75.0000 mL | Freq: Once | INTRAVENOUS | Status: AC | PRN
  Administered 2018-03-06: 75 mL via INTRAVENOUS
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
## 2018-04-19 ENCOUNTER — Ambulatory Visit (INDEPENDENT_AMBULATORY_CARE_PROVIDER_SITE_OTHER): Payer: 59 | Admitting: Sports Medicine

## 2018-04-19 ENCOUNTER — Encounter: Payer: Self-pay | Admitting: Sports Medicine

## 2018-04-19 ENCOUNTER — Encounter

## 2018-04-19 ENCOUNTER — Other Ambulatory Visit: Payer: Self-pay | Admitting: Sports Medicine

## 2018-04-19 ENCOUNTER — Ambulatory Visit
Admission: RE | Admit: 2018-04-19 | Discharge: 2018-04-19 | Disposition: A | Discharge status: Home / Self Care | Payer: 59 | Source: Ambulatory Visit | Attending: Sports Medicine | Admitting: Sports Medicine

## 2018-04-19 VITALS — BP 126/82 | Ht 67.0 in | Wt 135.0 lb

## 2018-04-19 DIAGNOSIS — M542 Cervicalgia: Secondary | ICD-10-CM | POA: Diagnosis not present

## 2018-04-19 DIAGNOSIS — G2589 Other specified extrapyramidal and movement disorders: Secondary | ICD-10-CM

## 2018-04-19 DIAGNOSIS — Z87828 Personal history of other (healed) physical injury and trauma: Secondary | ICD-10-CM | POA: Diagnosis not present

## 2018-04-19 DIAGNOSIS — M546 Pain in thoracic spine: Secondary | ICD-10-CM

## 2018-04-19 DIAGNOSIS — M40293 Other kyphosis, cervicothoracic region: Secondary | ICD-10-CM | POA: Diagnosis not present

## 2018-04-19 DIAGNOSIS — M5412 Radiculopathy, cervical region: Secondary | ICD-10-CM | POA: Diagnosis not present

## 2018-04-19 DIAGNOSIS — R29898 Other symptoms and signs involving the musculoskeletal system: Secondary | ICD-10-CM | POA: Diagnosis not present

## 2018-04-19 DIAGNOSIS — M40209 Unspecified kyphosis, site unspecified: Secondary | ICD-10-CM | POA: Insufficient documentation
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
## 2018-04-19 NOTE — Assessment & Plan Note (Signed)
Improving with HEP

## 2018-04-19 NOTE — Assessment & Plan Note (Signed)
Will try to improve with PT

## 2018-04-25 ENCOUNTER — Other Ambulatory Visit: Payer: Self-pay | Admitting: Physician Assistant

## 2018-04-25 DIAGNOSIS — E039 Hypothyroidism, unspecified: Secondary | ICD-10-CM

## 2018-04-25 NOTE — Telephone Encounter (Signed)
Synthroid 75 mcg refill Last Refill:10/27/17 # 90 Last OV: 08/18/17 and TSH PCP: Philis Fendt Pharmacy:[facility], [place]

## 2018-04-30 ENCOUNTER — Telehealth: Payer: Self-pay | Admitting: Physician Assistant

## 2018-04-30 ENCOUNTER — Other Ambulatory Visit: Payer: Self-pay | Admitting: *Deleted

## 2018-04-30 DIAGNOSIS — E039 Hypothyroidism, unspecified: Secondary | ICD-10-CM

## 2018-04-30 MED ORDER — LEVOTHYROXINE SODIUM 75 MCG PO TABS: 75.0000 ug | ORAL_TABLET | Freq: Every day | ORAL | 0 refills | Status: DC

## 2018-04-30 MED FILL — LEVOTHYROXINE 75 MCG TABLET: 75 | 30 days supply | Qty: 30 | Fill #0

## 2018-04-30 NOTE — Telephone Encounter (Signed)
Copied from [place] [id number]. Topic: Quick Communication - Rx Refill/Question >> Apr 30, 2018 10:57 AM Marin Olp L wrote: Medication: levothyroxine (SYNTHROID, LEVOTHROID) 75 MCG tablet  Has the patient contacted their pharmacy? Yes.   (Agent: If no, request that the patient contact the pharmacy for the refill.) (Agent: If yes, when and what did the pharmacy advise?)  Preferred Pharmacy (with phone number or street name): [facility], [place]. [address] [place] [address] Phone: [phone] Fax: [phone]  Agent: Please be advised that RX refills may take up to 3 business days. We ask that you follow-up with your pharmacy.

## 2018-05-10 ENCOUNTER — Other Ambulatory Visit: Payer: Self-pay

## 2018-05-10 ENCOUNTER — Encounter: Payer: Self-pay | Admitting: Physical Therapy

## 2018-05-10 ENCOUNTER — Ambulatory Visit: Payer: 59 | Attending: Sports Medicine | Admitting: Physical Therapy

## 2018-05-10 DIAGNOSIS — M6281 Muscle weakness (generalized): Secondary | ICD-10-CM | POA: Insufficient documentation

## 2018-05-10 DIAGNOSIS — R293 Abnormal posture: Secondary | ICD-10-CM | POA: Insufficient documentation

## 2018-05-10 DIAGNOSIS — M25511 Pain in right shoulder: Secondary | ICD-10-CM | POA: Diagnosis not present

## 2018-05-10 DIAGNOSIS — M79601 Pain in right arm: Secondary | ICD-10-CM | POA: Diagnosis not present
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
## 2018-05-15 ENCOUNTER — Ambulatory Visit: Payer: 59 | Attending: Sports Medicine | Admitting: Physical Therapy

## 2018-05-15 ENCOUNTER — Encounter: Payer: Self-pay | Admitting: Physical Therapy

## 2018-05-15 DIAGNOSIS — M6281 Muscle weakness (generalized): Secondary | ICD-10-CM | POA: Diagnosis not present

## 2018-05-15 DIAGNOSIS — M25511 Pain in right shoulder: Secondary | ICD-10-CM | POA: Diagnosis not present

## 2018-05-15 DIAGNOSIS — R293 Abnormal posture: Secondary | ICD-10-CM | POA: Diagnosis not present

## 2018-05-15 DIAGNOSIS — M79601 Pain in right arm: Secondary | ICD-10-CM | POA: Insufficient documentation
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
## 2018-05-22 ENCOUNTER — Encounter

## 2018-05-28 ENCOUNTER — Encounter: Payer: Self-pay | Admitting: Physical Therapy

## 2018-05-28 ENCOUNTER — Ambulatory Visit: Payer: 59 | Admitting: Physical Therapy

## 2018-05-28 DIAGNOSIS — R293 Abnormal posture: Secondary | ICD-10-CM

## 2018-05-28 DIAGNOSIS — M6281 Muscle weakness (generalized): Secondary | ICD-10-CM

## 2018-05-28 DIAGNOSIS — M79601 Pain in right arm: Secondary | ICD-10-CM

## 2018-05-28 DIAGNOSIS — M25511 Pain in right shoulder: Secondary | ICD-10-CM
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
## 2018-05-28 NOTE — Patient Instructions (Signed)
Over Head Pull: Narrow Grip       On back, knees bent, feet flat, band across thighs, elbows straight but relaxed. Pull hands apart (start). Keeping elbows straight, bring arms up and over head, hands toward floor. Keep pull steady on band. Hold momentarily. Return slowly, keeping pull steady, back to start. Repeat ___ times. Band color ______   Side Pull: Double Arm   On back, knees bent, feet flat. Arms perpendicular to body, shoulder level, elbows straight but relaxed. Pull arms out to sides, elbows straight. Resistance band comes across collarbones, hands toward floor. Hold momentarily. Slowly return to starting position. Repeat ___ times. Band color _____   Sash   On back, knees bent, feet flat, left hand on left hip, right hand above left. Pull right arm DIAGONALLY (hip to shoulder) across chest. Bring right arm along head toward floor. Hold momentarily. Slowly return to starting position. Repeat ___ times. Do with left arm. Band color ______   Shoulder Rotation: Double Arm   On back, knees bent, feet flat, elbows tucked at sides, bent 90, hands palms up. Pull hands apart and down toward floor, keeping elbows near sides. Hold momentarily. Slowly return to starting position. Repeat ___ times. Band color ______   

## 2018-06-01 ENCOUNTER — Ambulatory Visit: Payer: 59 | Admitting: Physical Therapy

## 2018-06-01 DIAGNOSIS — M6281 Muscle weakness (generalized): Secondary | ICD-10-CM | POA: Diagnosis not present

## 2018-06-01 DIAGNOSIS — M25511 Pain in right shoulder: Secondary | ICD-10-CM | POA: Diagnosis not present

## 2018-06-01 DIAGNOSIS — M79601 Pain in right arm: Secondary | ICD-10-CM

## 2018-06-01 DIAGNOSIS — R293 Abnormal posture: Secondary | ICD-10-CM

## 2018-06-02 ENCOUNTER — Encounter: Payer: Self-pay | Admitting: Physical Therapy
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
## 2018-06-04 ENCOUNTER — Ambulatory Visit: Payer: 59 | Admitting: Physical Therapy

## 2018-06-04 ENCOUNTER — Encounter: Payer: Self-pay | Admitting: Physical Therapy

## 2018-06-04 DIAGNOSIS — M25511 Pain in right shoulder: Secondary | ICD-10-CM | POA: Diagnosis not present

## 2018-06-04 DIAGNOSIS — R293 Abnormal posture: Secondary | ICD-10-CM

## 2018-06-04 DIAGNOSIS — M6281 Muscle weakness (generalized): Secondary | ICD-10-CM

## 2018-06-04 DIAGNOSIS — M79601 Pain in right arm: Secondary | ICD-10-CM | POA: Diagnosis not present
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
## 2018-06-08 ENCOUNTER — Ambulatory Visit: Payer: 59 | Admitting: Physical Therapy

## 2018-06-11 ENCOUNTER — Ambulatory Visit: Payer: 59 | Admitting: Physical Therapy

## 2018-06-11 DIAGNOSIS — M6281 Muscle weakness (generalized): Secondary | ICD-10-CM | POA: Diagnosis not present

## 2018-06-11 DIAGNOSIS — R293 Abnormal posture: Secondary | ICD-10-CM | POA: Diagnosis not present

## 2018-06-11 DIAGNOSIS — M25511 Pain in right shoulder: Secondary | ICD-10-CM

## 2018-06-11 DIAGNOSIS — M79601 Pain in right arm: Secondary | ICD-10-CM | POA: Diagnosis not present
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
## 2018-06-15 ENCOUNTER — Encounter: Payer: Self-pay | Admitting: Family Medicine

## 2018-06-15 ENCOUNTER — Other Ambulatory Visit: Payer: Self-pay

## 2018-06-15 ENCOUNTER — Ambulatory Visit: Payer: 59 | Attending: Sports Medicine | Admitting: Physical Therapy

## 2018-06-15 ENCOUNTER — Ambulatory Visit: Payer: 59 | Admitting: Family Medicine

## 2018-06-15 ENCOUNTER — Encounter: Payer: Self-pay | Admitting: Physical Therapy

## 2018-06-15 VITALS — BP 130/75 | HR 52 | Temp 98.6°F | Resp 18 | Ht 67.0 in | Wt 134.6 lb

## 2018-06-15 DIAGNOSIS — E039 Hypothyroidism, unspecified: Secondary | ICD-10-CM | POA: Diagnosis not present

## 2018-06-15 DIAGNOSIS — M79601 Pain in right arm: Secondary | ICD-10-CM | POA: Insufficient documentation

## 2018-06-15 DIAGNOSIS — R293 Abnormal posture: Secondary | ICD-10-CM | POA: Diagnosis not present

## 2018-06-15 DIAGNOSIS — Z23 Encounter for immunization: Secondary | ICD-10-CM

## 2018-06-15 DIAGNOSIS — M25511 Pain in right shoulder: Secondary | ICD-10-CM | POA: Insufficient documentation

## 2018-06-15 DIAGNOSIS — M6281 Muscle weakness (generalized): Secondary | ICD-10-CM | POA: Insufficient documentation

## 2018-06-15 MED ORDER — LEVOTHYROXINE SODIUM 75 MCG PO TABS: 75.0000 ug | ORAL_TABLET | Freq: Every day | ORAL | 0 refills | Status: DC

## 2018-06-15 MED ORDER — LEVOTHYROXINE SODIUM 75 MCG PO TABS: 75.0000 ug | ORAL_TABLET | Freq: Every day | ORAL | 3 refills | Status: DC
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
## 2018-06-15 NOTE — Patient Instructions (Addendum)
  I recommend Shingrix - that should be able to be given at your pharmacy.   Flu vaccine and pneumonia vaccines given today.  No change in Synthroid, I will check TSH and let you know if any changes needed.  Follow-up for a wellness exam/physical in the next 6 months if possible.  Let me know if there are questions prior to that time.  Thank you for coming in today.   If you have lab work done today you will be contacted with your lab results within the next 2 weeks.  If you have not heard from [place] then please contact us. The fastest way to get your results is to register for My Chart.   IF you received an x-ray today, you will receive an invoice from [facility]. Please contact [facility] at [phone] with questions or concerns regarding your invoice.   IF you received labwork today, you will receive an invoice from [facility]. Please contact [facility] at [phone] with questions or concerns regarding your invoice.   Our billing staff will not be able to assist you with questions regarding bills from these companies.  You will be contacted with the lab results as soon as they are available. The fastest way to get your results is to activate your My Chart account. Instructions are located on the last page of this paperwork. If you have not heard from [place] regarding the results in 2 weeks, please contact this office.
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
## 2018-06-16 ENCOUNTER — Encounter: Payer: Self-pay | Admitting: Family Medicine

## 2018-06-16 LAB — TSH: TSH: 9.66 u[IU]/mL — ABNORMAL HIGH (ref 0.450–4.500)

## 2018-06-18 ENCOUNTER — Other Ambulatory Visit: Payer: Self-pay | Admitting: Physician Assistant

## 2018-06-18 DIAGNOSIS — E039 Hypothyroidism, unspecified: Secondary | ICD-10-CM

## 2018-06-18 MED FILL — LEVOTHYROXINE 75 MCG TABLET: 75 | 90 days supply | Qty: 90 | Fill #0

## 2018-06-19 ENCOUNTER — Ambulatory Visit: Payer: 59 | Admitting: Physical Therapy

## 2018-06-20 ENCOUNTER — Ambulatory Visit: Payer: 59 | Admitting: Physical Therapy

## 2018-06-25 ENCOUNTER — Encounter: Payer: Self-pay | Admitting: Family Medicine

## 2018-06-25 ENCOUNTER — Other Ambulatory Visit: Payer: Self-pay | Admitting: Family Medicine

## 2018-06-25 DIAGNOSIS — E039 Hypothyroidism, unspecified: Secondary | ICD-10-CM

## 2018-06-25 DIAGNOSIS — R7989 Other specified abnormal findings of blood chemistry: Secondary | ICD-10-CM

## 2018-06-25 NOTE — Progress Notes (Signed)
tsh

## 2018-06-26 ENCOUNTER — Ambulatory Visit (INDEPENDENT_AMBULATORY_CARE_PROVIDER_SITE_OTHER): Payer: 59 | Admitting: Family Medicine

## 2018-06-26 DIAGNOSIS — R7989 Other specified abnormal findings of blood chemistry: Secondary | ICD-10-CM

## 2018-06-26 DIAGNOSIS — E039 Hypothyroidism, unspecified: Secondary | ICD-10-CM | POA: Diagnosis not present

## 2018-06-26 NOTE — Progress Notes (Signed)
Lab only visit 

## 2018-06-27 LAB — TSH+FREE T4
FREE T4: 1.23 ng/dL (ref 0.82–1.77)
TSH: 4.18 u[IU]/mL (ref 0.450–4.500)

## 2018-06-27 LAB — T3, FREE: T3, Free: 2.3 pg/mL (ref 2.0–4.4)

## 2018-07-04 ENCOUNTER — Encounter: Payer: Self-pay | Admitting: Family Medicine

## 2018-07-04 ENCOUNTER — Encounter: Payer: Self-pay | Admitting: Physical Therapy

## 2018-07-10 ENCOUNTER — Encounter: Payer: Self-pay | Admitting: Family Medicine

## 2018-07-10 DIAGNOSIS — M8589 Other specified disorders of bone density and structure, multiple sites: Secondary | ICD-10-CM | POA: Diagnosis not present

## 2018-07-10 DIAGNOSIS — Z8262 Family history of osteoporosis: Secondary | ICD-10-CM | POA: Diagnosis not present

## 2018-07-10 DIAGNOSIS — Z13820 Encounter for screening for osteoporosis: Secondary | ICD-10-CM | POA: Diagnosis not present

## 2018-07-10 DIAGNOSIS — S2249XA Multiple fractures of ribs, unspecified side, initial encounter for closed fracture: Secondary | ICD-10-CM | POA: Diagnosis not present

## 2018-07-13 MED FILL — SHINGRIX 50 MCG SUS: 50 | 1 days supply | Qty: 1 | Fill #0

## 2018-07-19 ENCOUNTER — Encounter: Payer: Self-pay | Admitting: Family Medicine

## 2018-07-26 DIAGNOSIS — L929 Granulomatous disorder of the skin and subcutaneous tissue, unspecified: Secondary | ICD-10-CM | POA: Diagnosis not present

## 2018-07-26 DIAGNOSIS — D485 Neoplasm of uncertain behavior of skin: Secondary | ICD-10-CM | POA: Diagnosis not present

## 2018-07-26 DIAGNOSIS — L570 Actinic keratosis: Secondary | ICD-10-CM | POA: Diagnosis not present

## 2018-07-26 DIAGNOSIS — Z85828 Personal history of other malignant neoplasm of skin: Secondary | ICD-10-CM | POA: Diagnosis not present

## 2018-07-26 DIAGNOSIS — C44612 Basal cell carcinoma of skin of right upper limb, including shoulder: Secondary | ICD-10-CM | POA: Diagnosis not present

## 2018-07-26 DIAGNOSIS — Z23 Encounter for immunization: Secondary | ICD-10-CM | POA: Diagnosis not present

## 2018-07-26 DIAGNOSIS — L821 Other seborrheic keratosis: Secondary | ICD-10-CM | POA: Diagnosis not present

## 2018-07-26 MED FILL — FLUOROURACIL 5% CREAM: 5 | 14 days supply | Qty: 40 | Fill #0

## 2018-07-27 ENCOUNTER — Encounter: Payer: Self-pay | Admitting: Family Medicine

## 2018-07-31 ENCOUNTER — Encounter: Payer: Self-pay | Admitting: Interventional Cardiology

## 2018-07-31 ENCOUNTER — Ambulatory Visit: Payer: 59 | Admitting: Interventional Cardiology

## 2018-07-31 VITALS — BP 124/74 | HR 51 | Ht 67.0 in | Wt 137.8 lb

## 2018-07-31 DIAGNOSIS — I712 Thoracic aortic aneurysm, without rupture, unspecified: Secondary | ICD-10-CM

## 2018-07-31 DIAGNOSIS — I480 Paroxysmal atrial fibrillation: Secondary | ICD-10-CM | POA: Diagnosis not present

## 2018-07-31 DIAGNOSIS — R030 Elevated blood-pressure reading, without diagnosis of hypertension: Secondary | ICD-10-CM

## 2018-07-31 MED ORDER — AMOXICILLIN 500 MG PO CAPS: ORAL_CAPSULE | ORAL | 3 refills | Status: DC

## 2018-07-31 MED FILL — AMOXICILLIN 500 MG CAPSULE: 500 | 1 days supply | Qty: 4 | Fill #0

## 2018-07-31 NOTE — Patient Instructions (Signed)
Medication Instructions:  Your physician recommends that you continue on your current medications as directed. Please refer to the Current Medication list given to you today.   Your physician discussed the importance of taking an antibiotic prior to any dental, gastrointestinal, genitourinary procedures to prevent damage to the heart valves from infection. You were given a prescription for an antibiotic based on current SBE prophylaxis guidelines.  Prescription given for "Amoxicillin 2 grams orally one hour before procedure"  If you need a refill on your cardiac medications before your next appointment, please call your pharmacy.   Lab work: None Ordered  If you have labs (blood work) drawn today and your tests are completely normal, you will receive your results only by: Marland Kitchen MyChart Message (if you have MyChart) OR . A paper copy in the mail If you have any lab test that is abnormal or we need to change your treatment, we will call you to review the results.  Testing/Procedures: None ordered  Follow-Up: At [facility], you and your health needs are our priority.  As part of our continuing mission to provide you with exceptional heart care, we have created designated Provider Care Teams.  These Care Teams include your primary Cardiologist (physician) and Advanced Practice Providers (APPs -  Physician Assistants and Nurse Practitioners) who all work together to provide you with the care you need, when you need it. . You will need a follow up appointment in 1 year.  Please call our office 2 months in advance to schedule this appointment.  You may see Casandra Doffing, MD or one of the following Advanced Practice Providers on your designated Care Team:   . Lyda Jester, PA-C . Dayna Dunn, PA-C . Ermalinda Barrios, PA-C  Any Other Special Instructions Will Be Listed Below (If Applicable).
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
## 2018-08-01 NOTE — Telephone Encounter (Signed)
Spoke with pt

## 2018-08-03 MED FILL — metroNIDAZOLE 0.75 % GEL: 0.75 | 30 days supply | Qty: 45 | Fill #0

## 2018-08-21 MED FILL — LEVOTHYROXINE 75 MCG TABLET: 75 | 90 days supply | Qty: 90 | Fill #1

## 2018-09-26 MED FILL — SHINGRIX 50 MCG SUS: 50 | 1 days supply | Qty: 1 | Fill #1

## 2018-10-30 DIAGNOSIS — D485 Neoplasm of uncertain behavior of skin: Secondary | ICD-10-CM | POA: Diagnosis not present

## 2018-10-30 DIAGNOSIS — D044 Carcinoma in situ of skin of scalp and neck: Secondary | ICD-10-CM | POA: Diagnosis not present

## 2018-10-30 DIAGNOSIS — L821 Other seborrheic keratosis: Secondary | ICD-10-CM | POA: Diagnosis not present

## 2018-11-22 MED FILL — LEVOTHYROXINE 75 MCG TABLET: 75 | 90 days supply | Qty: 90 | Fill #2

## 2018-12-12 ENCOUNTER — Other Ambulatory Visit: Payer: Self-pay | Admitting: Cardiothoracic Surgery

## 2018-12-12 DIAGNOSIS — Z95828 Presence of other vascular implants and grafts: Secondary | ICD-10-CM

## 2019-01-31 ENCOUNTER — Other Ambulatory Visit: Payer: Self-pay

## 2019-01-31 NOTE — Patient Outreach (Signed)
[facility]) Care Management  01/31/2019  James Mayo 04/14/1951 [id number]   Patient screened for HTA Health Risk Assessment.  Requested Advance Directive documentation to be mailed. Advance Directive packet and EMMI mailed today. Will follow up within the next month to ensure receipt.   Ronn Melena, BSW Social Worker [phone]

## 2019-02-20 MED FILL — LEVOTHYROXINE 75 MCG TABLET: 75 | 90 days supply | Qty: 90 | Fill #3

## 2019-03-07 ENCOUNTER — Ambulatory Visit
Admission: RE | Admit: 2019-03-07 | Discharge: 2019-03-07 | Disposition: A | Discharge status: Home / Self Care | Payer: PPO | Source: Ambulatory Visit | Attending: Cardiothoracic Surgery | Admitting: Cardiothoracic Surgery

## 2019-03-07 ENCOUNTER — Other Ambulatory Visit: Payer: Self-pay

## 2019-03-07 ENCOUNTER — Ambulatory Visit: Payer: PPO | Admitting: Cardiothoracic Surgery

## 2019-03-07 VITALS — BP 135/78 | HR 56 | Temp 97.7°F | Resp 20 | Ht 67.0 in | Wt 138.0 lb

## 2019-03-07 DIAGNOSIS — Z95828 Presence of other vascular implants and grafts: Secondary | ICD-10-CM

## 2019-03-07 DIAGNOSIS — Z952 Presence of prosthetic heart valve: Secondary | ICD-10-CM

## 2019-03-07 DIAGNOSIS — I712 Thoracic aortic aneurysm, without rupture: Secondary | ICD-10-CM | POA: Diagnosis not present

## 2019-03-07 IMAGING — CR DG CHEST 2V
2 series · 2 of 2 positions shown · non-contrast
Comparison: CT 01/16/2017.  CT 01/04/2017.  Chest x-ray 01/04/2017.

CLINICAL DATA: Aortic surgery.  Preoperative chest x-ray.

EXAM:
CHEST  2 VIEW

[w chest pa]
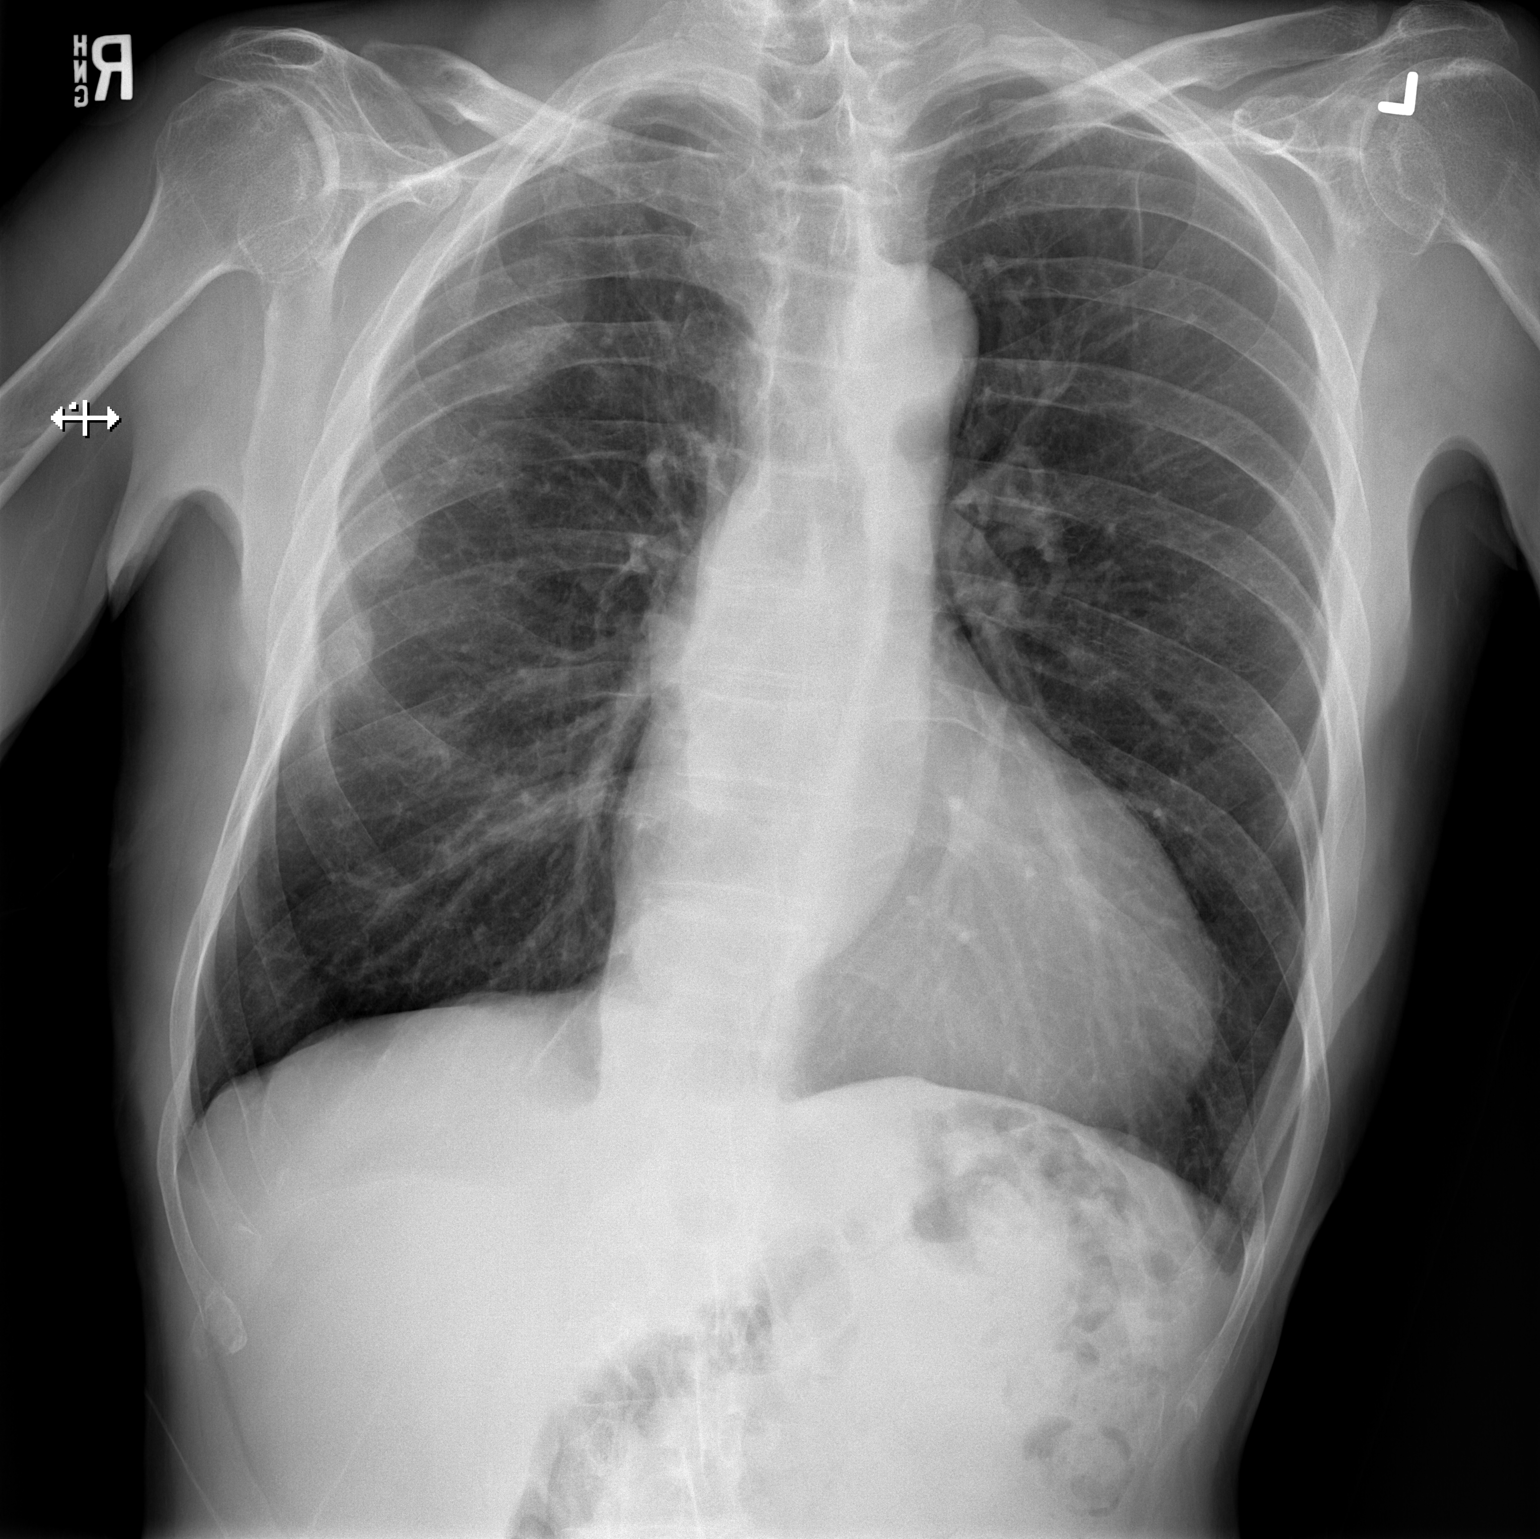

[w chest lat]
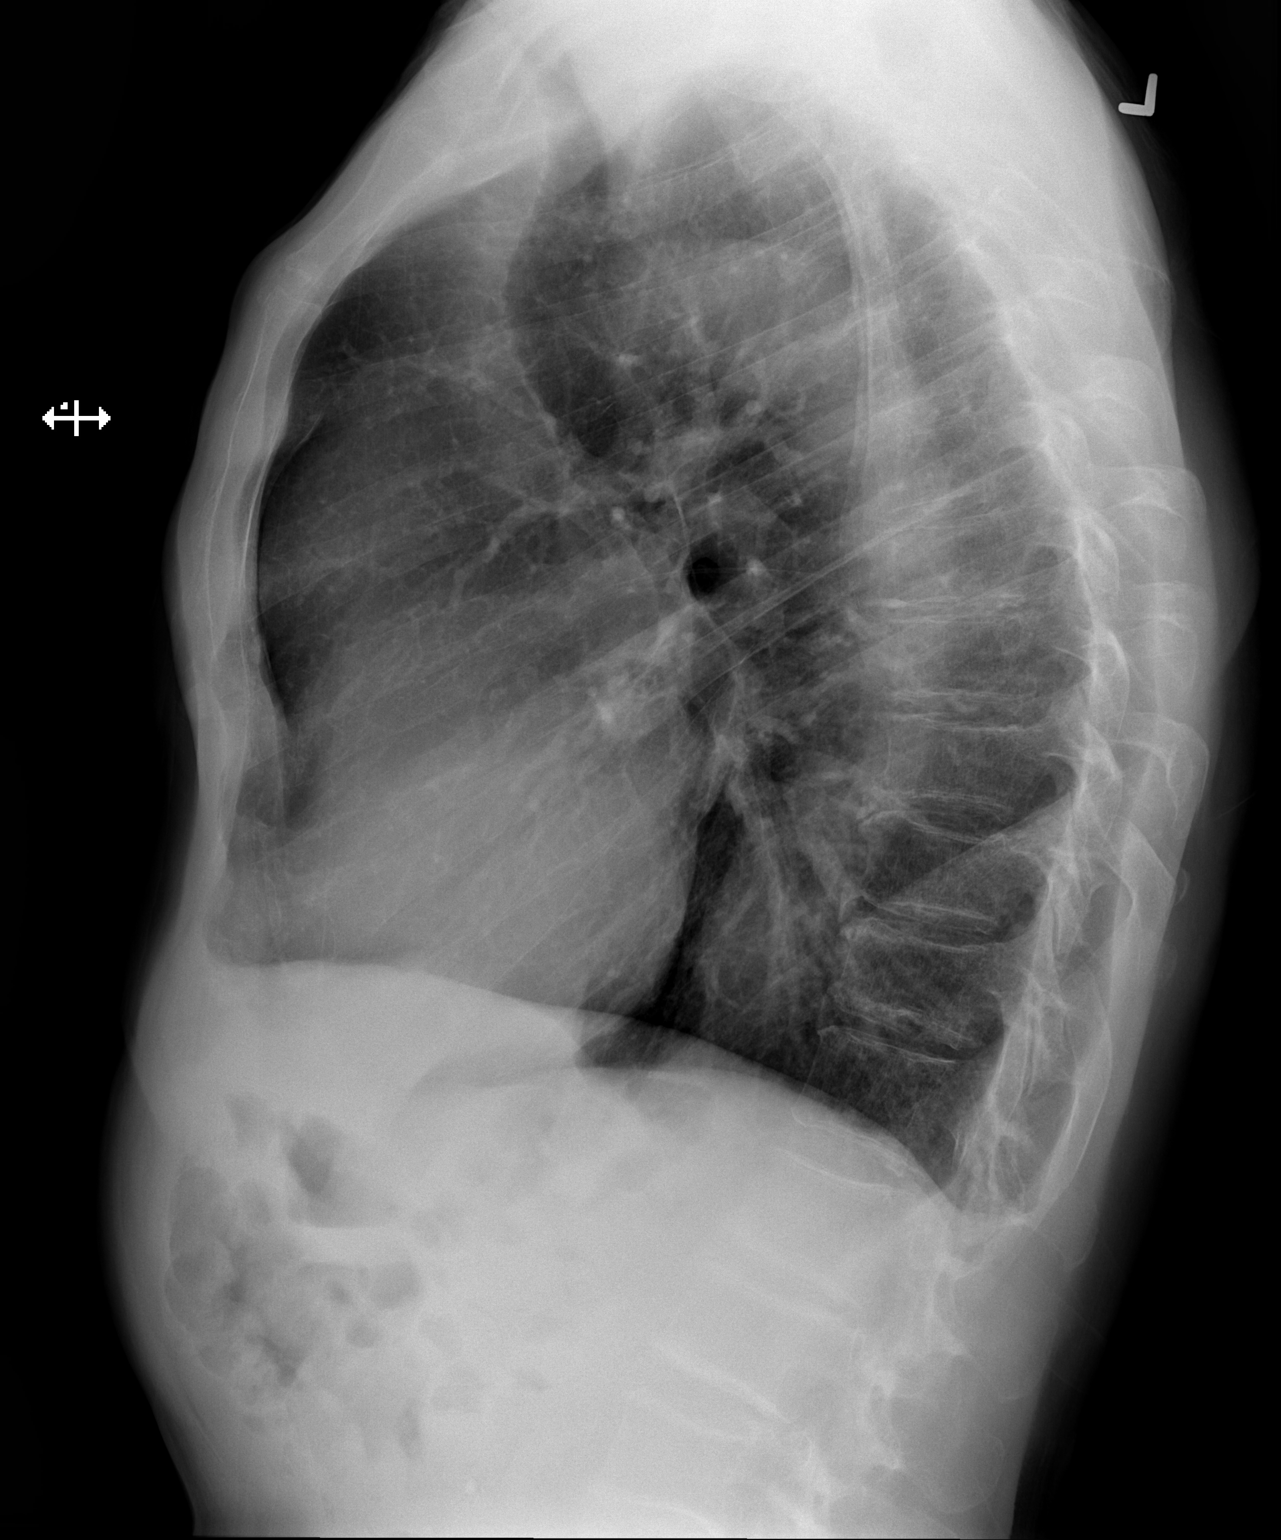

[2 of 2 positions shown; findings below may reference images not displayed]

FINDINGS: Mediastinum and hilar structures stable. Aorta appears stable.
Stable cardiomegaly. No pulmonary venous congestion. No focal
infiltrate. No pleural effusion or pneumothorax . Multiple right rib
fractures with callus formation noted.
IMPRESSION: 1. No acute cardiopulmonary disease. Stable cardiomegaly. Ectatic
thoracic aorta is unchanged.

3.  Healing right rib fractures.

## 2019-03-07 MED ORDER — IOPAMIDOL (ISOVUE-370) INJECTION 76%
75.0000 mL | Freq: Once | INTRAVENOUS | Status: AC | PRN
  Administered 2019-03-07: 75 mL via INTRAVENOUS
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
## 2019-03-12 ENCOUNTER — Other Ambulatory Visit: Payer: Self-pay

## 2019-03-12 NOTE — Patient Outreach (Signed)
[facility]) Care Management  03/12/2019  James Mayo 05-05-1951 [id number]   [facility] BSW closing case due to patient receiving other CM services.   Ronn Melena, BSW Social Worker [phone]

## 2019-03-13 IMAGING — DX DG CHEST 1V PORT
1 series · 1 of 1 positions shown · non-contrast
Comparison: Portable chest x-ray March 07, 2017

CLINICAL DATA: Status post aortic valve replacement 2 days ago.

EXAM:
PORTABLE CHEST 1 VIEW

[chest ap]
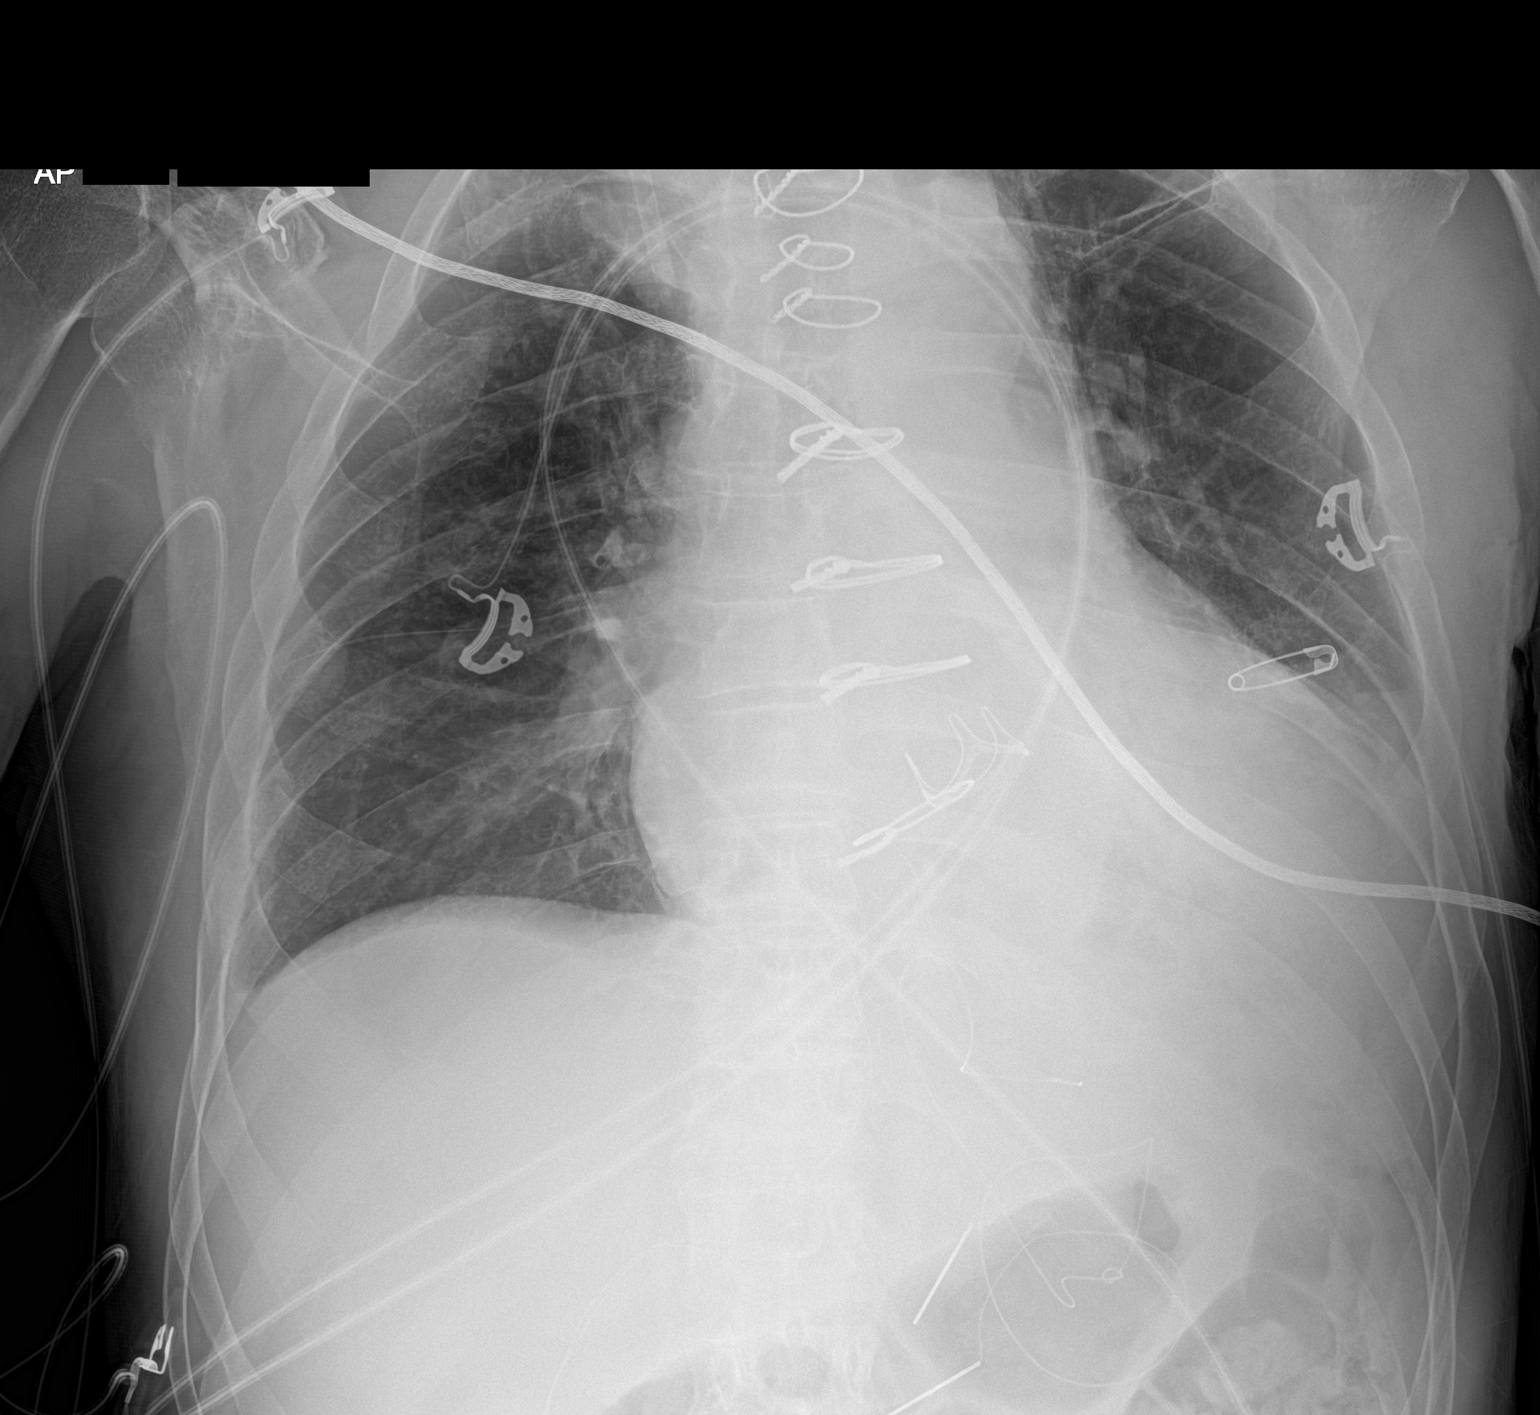

[1 of 1 positions shown; findings below may reference images not displayed]

FINDINGS: The lungs are adequately inflated. There is persistent left basilar
density with obscuration of the hemidiaphragm. The right lung base
is clear. The cardiac silhouette is enlarged. There is a prosthetic
aortic valve ring visible. The pulmonary vascularity is not
engorged. The Swan-Ganz catheter is been removed. The right internal
jugular Cordis sheath remains. The sternal wires are intact.
IMPRESSION: Persistent left lower lobe atelectasis or pneumonia. No pulmonary
edema.

## 2019-03-14 IMAGING — DX DG CHEST 2V
2 series · 2 of 2 positions shown · non-contrast
Comparison: 03/08/2017 .

CLINICAL DATA: Weakness.

EXAM:
CHEST  2 VIEW

[chest lat]
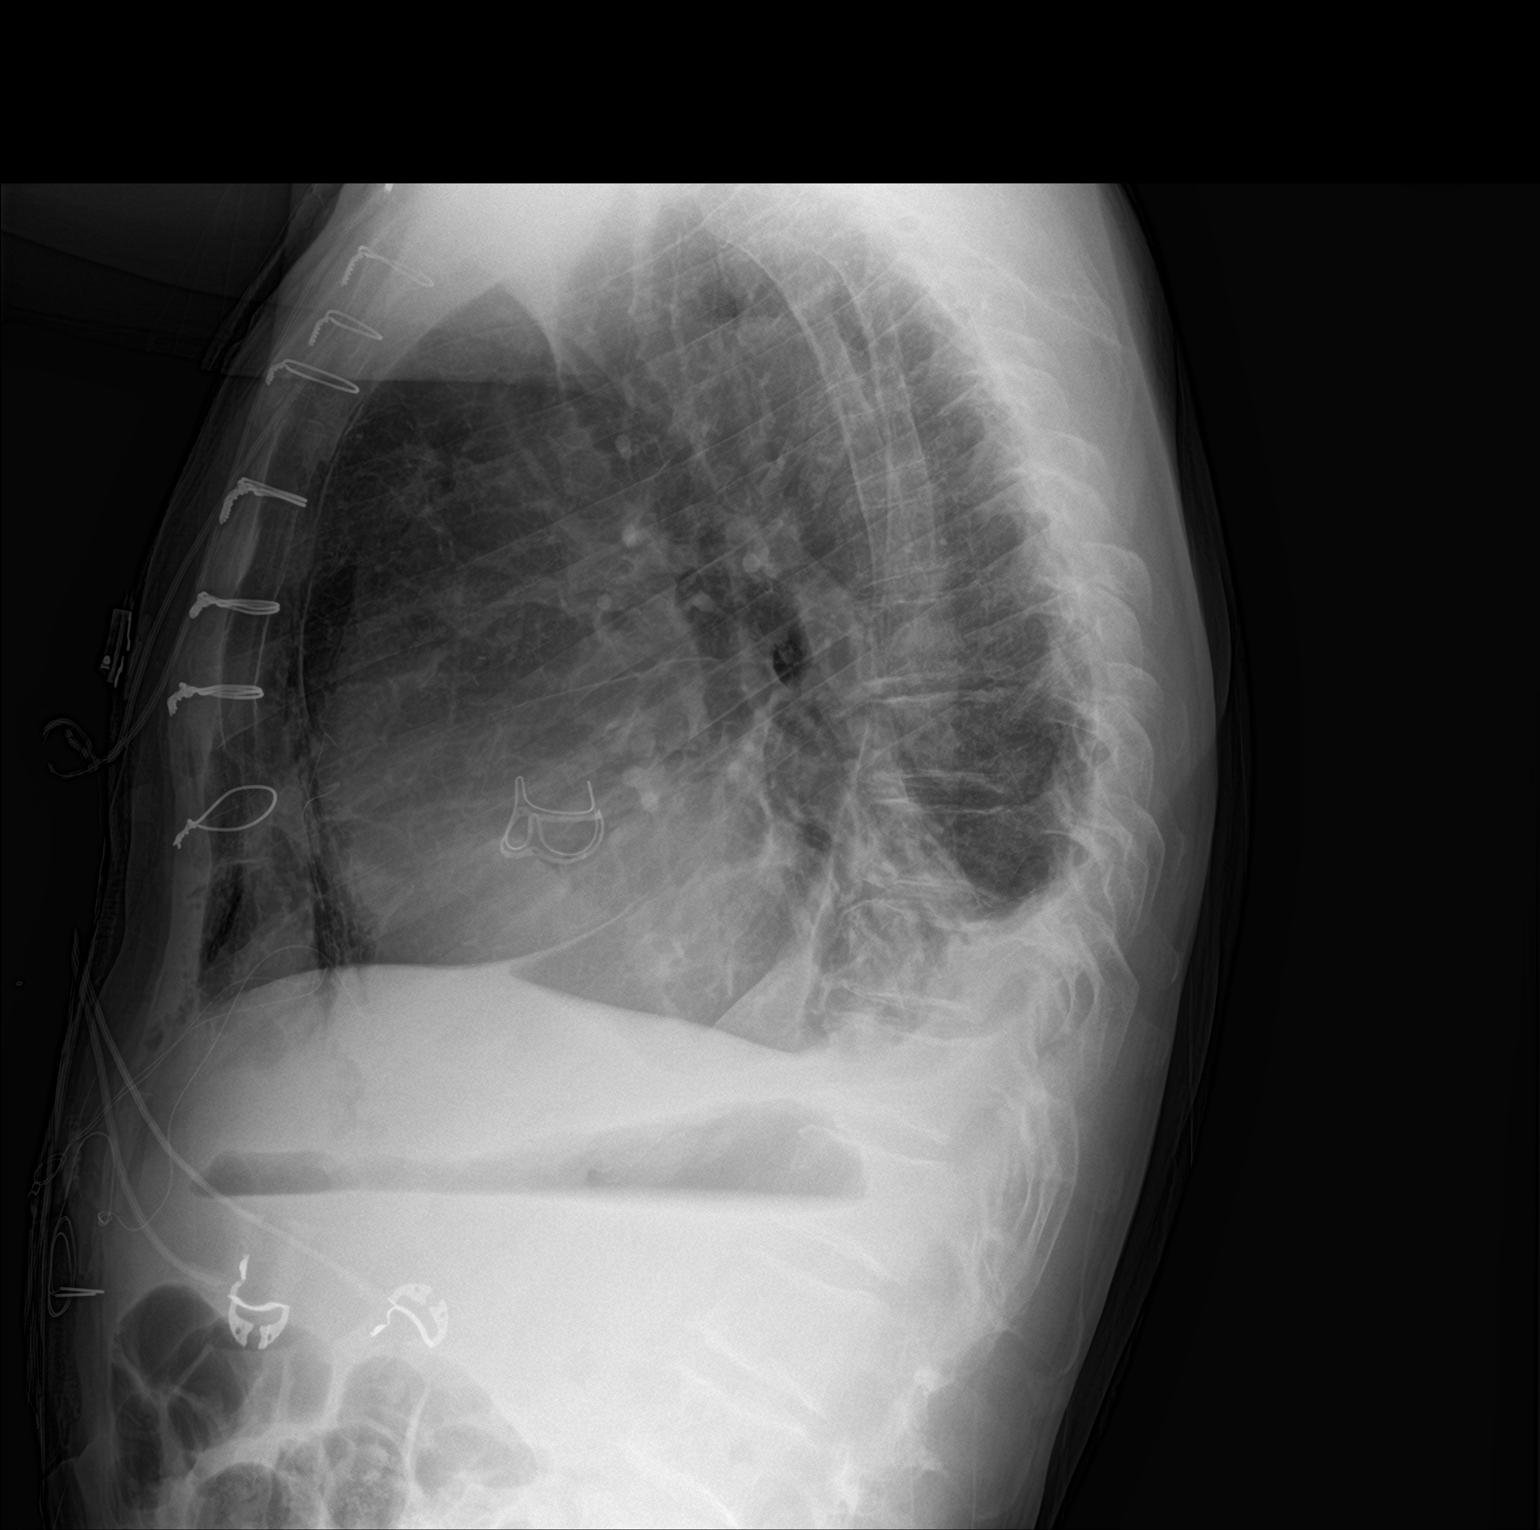

[chest pa]
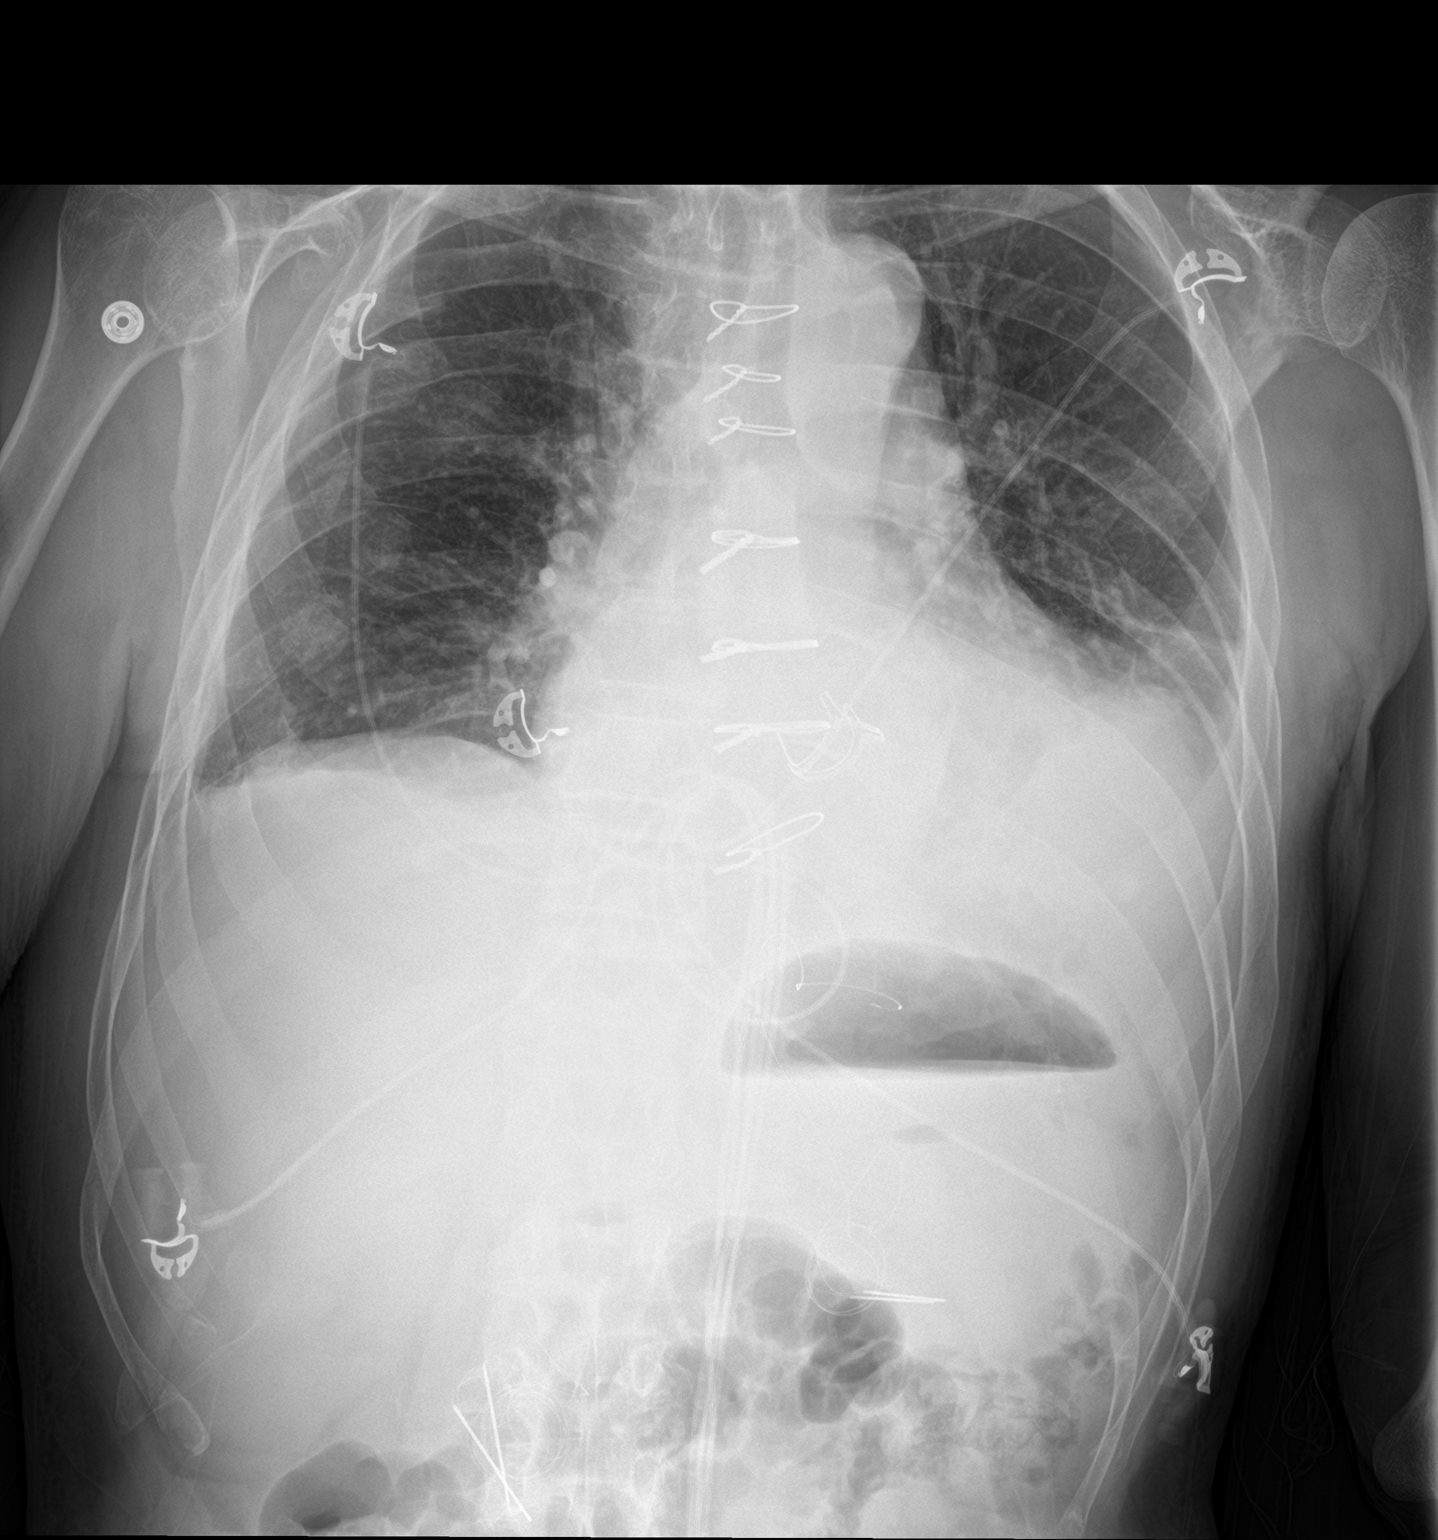

[2 of 2 positions shown; findings below may reference images not displayed]

FINDINGS: Prior cardiac valve replacement. Stable cardiomegaly. Persistent
left base atelectasis and infiltrate with slight interim clearing.
Small left pleural effusion. Mild right base subsegmental
atelectasis. No pneumothorax .
IMPRESSION: 1.  Prior cardiac valve replacement.  Stable cardiomegaly.

2. Persistent left base atelectasis and infiltrate with slight
interval clearing. Small left pleural effusion again noted. Mild
right base subsegmental atelectasis.

## 2019-03-21 ENCOUNTER — Encounter: Payer: Self-pay | Admitting: Cardiothoracic Surgery

## 2019-05-13 ENCOUNTER — Other Ambulatory Visit: Payer: Self-pay

## 2019-05-13 ENCOUNTER — Ambulatory Visit (INDEPENDENT_AMBULATORY_CARE_PROVIDER_SITE_OTHER): Payer: PPO | Admitting: Family Medicine

## 2019-05-13 VITALS — BP 128/88 | Ht 67.5 in | Wt 135.0 lb

## 2019-05-13 DIAGNOSIS — M25552 Pain in left hip: Secondary | ICD-10-CM | POA: Diagnosis not present

## 2019-05-13 NOTE — Patient Instructions (Signed)
Your exam is reassuring. You have spasms in your piriformis muscle. Do home exercises 3 sets of 10 once a day. Stretches hold for 20-30 seconds, repeat 3 times. Heat or ice (whichever feels better) 15 minutes at a time 3-4 times a day. Tylenol, ibuprofen only if needed. Decrease your workouts to about 50% of what you would normally do, increase by 10% roughly as tolerated each workout as long as pain doesn't worsen with these. Avoid hills where you can which I know is tough around here. No deep squats, deep lunges, leg press exercises. Follow up with me in 1 month or as needed.

## 2019-05-13 NOTE — Progress Notes (Signed)
PCP: Wendie Agreste, MD  Subjective:   HPI: Patient is a 68 y.o. male here for hip pain.  He states he was in his usual state of health until 2 weeks ago when he had a particularly strenuous day of weight lifting and next day after performing his daily cycling, he noticed 4/10 pain on his left buttock. He describes the pain as achy pain without radiation. It is exacerbated by laying supine on the left side and is alleviated by heat pads. He mentions also using a 'stimulator' on his buttock without significant relief. He denies any swelling, erythema, edema. He mentions that he assumed the pain to be due to muscular strain and was hoping it would resolve with time but it has stayed consistent for about a week and came to the clinic for evaluation. He mentions that he is able to continue with swimming without significant difficulty but he has stopped cycling.  Past Medical History:  Diagnosis Date  . Aortic valve disorder 03/06/2017    S/p AVR with Edwards pericardial tissue valve and replacement of the ascending aorta with a 32 mm Hemashield Side Arm Graft  . BASAL CELL CARCINOMA, FACE 08/19/2009   Qualifier: Diagnosis of By: Oneida Alar MD, KARL    . BASAL CELL CARCINOMA, FACE 08/19/2009   Qualifier: Diagnosis of  By: Oneida Alar MD, KARL    . Coronary artery disease   . Metatarsalgia of left foot 04/29/2014  . Neck pain, bilateral 11/08/2013  . PAF (paroxysmal atrial fibrillation) ([place]) 03/09/2017  . RHINITIS, ALLERGIC 11/09/2006   Qualifier: Diagnosis of  By: Samara Snide    . SHOULDER PAIN, RIGHT 05/06/2009   Qualifier: Diagnosis of  By: Oneida Alar MD, KARL    . Thoracic ascending aortic aneurysm ([place])   . Thyroid condition     Current Outpatient Medications on File Prior to Visit  Medication Sig Dispense Refill  . levothyroxine (SYNTHROID, LEVOTHROID) 75 MCG tablet Take 1 tablet (75 mcg total) by mouth daily before breakfast. 90 tablet 3  . amoxicillin (AMOXIL) 500 MG capsule Take 4 capsules 1 hour  prior to any dental procedures (Patient not taking: Reported on 03/07/2019) 4 capsule 3  . fluorouracil (EFUDEX) 5 % cream   0   No current facility-administered medications on file prior to visit.     Past Surgical History:  Procedure Laterality Date  . AORTIC VALVE REPLACEMENT  03/06/2017   Procedure: AORTIC VALVE REPLACEMENT (AVR) using Magna Ease valve Size 13mm;  Surgeon: Grace Isaac, MD;  Location: [place];  Service: Open Heart Surgery;;  . CARDIAC CATHETERIZATION    . CARDIOVERSION N/A 04/14/2017   Procedure: CARDIOVERSION;  Surgeon: Larey Dresser, MD;  Location: [facility] ENDOSCOPY;  Service: Cardiovascular;  Laterality: N/A;  . EYE SURGERY Bilateral    LASIK 10 years  . REPLACEMENT ASCENDING AORTA N/A 03/06/2017   Procedure: REPLACEMENT ASCENDING AORTA using 27mm Hemashield Graft - with Hypothermic Circulatory Arrest;  Surgeon: Grace Isaac, MD;  Location: [place];  Service: Open Heart Surgery;  Laterality: N/A;  . RIGHT/LEFT HEART CATH AND CORONARY ANGIOGRAPHY N/A 01/25/2017   Procedure: Right/Left Heart Cath and Coronary Angiography;  Surgeon: Troy Sine, MD;  Location: [place] CV LAB;  Service: Cardiovascular;  Laterality: N/A;  . TEE WITHOUT CARDIOVERSION N/A 03/06/2017   Procedure: TRANSESOPHAGEAL ECHOCARDIOGRAM (TEE);  Surgeon: Grace Isaac, MD;  Location: [place];  Service: Open Heart Surgery;  Laterality: N/A;  . TEE WITHOUT CARDIOVERSION N/A 04/14/2017   Procedure: TRANSESOPHAGEAL  ECHOCARDIOGRAM (TEE);  Surgeon: Larey Dresser, MD;  Location: [facility] ENDOSCOPY;  Service: Cardiovascular;  Laterality: N/A;    Allergies  Allergen Reactions  . No Known Allergies     Social History   Socioeconomic History  . Marital status: Married    Spouse name: Not on file  . Number of children: Not on file  . Years of education: Not on file  . Highest education level: Not on file  Occupational History  . Not on file  Social Needs  . Financial resource strain: Not on  file  . Food insecurity    Worry: Not on file    Inability: Not on file  . Transportation needs    Medical: Not on file    Non-medical: Not on file  Tobacco Use  . Smoking status: Never Smoker  . Smokeless tobacco: Never Used  Substance and Sexual Activity  . Alcohol use: Yes    Comment: 2 beers - 2-3 times/ weeek  . Drug use: No  . Sexual activity: Not on file  Lifestyle  . Physical activity    Days per week: Not on file    Minutes per session: Not on file  . Stress: Not on file  Relationships  . Social Herbalist on phone: Not on file    Gets together: Not on file    Attends religious service: Not on file    Active member of club or organization: Not on file    Attends meetings of clubs or organizations: Not on file    Relationship status: Not on file  . Intimate partner violence    Fear of current or ex partner: Not on file    Emotionally abused: Not on file    Physically abused: Not on file    Forced sexual activity: Not on file  Other Topics Concern  . Not on file  Social History Narrative   ** Merged History Encounter **        Family History  Problem Relation Age of Onset  . Hypertension Mother   . Mental illness Brother     BP 128/88   Ht 5' 7.5" (1.715 m)   Wt 135 lb (61.2 kg)   BMI 20.83 kg/m   Review of Systems: See HPI above.     Objective:  Physical Exam:  Gen: NAD, comfortable in exam room  Left Hip No asymmetry, negative [place], no edema, ecchymosis, rash, instability, or swelling. Erythematous area above hip without pruritus. Tenderness to deep palpation on mid gluteus maximus, over piriformis. Active and passive range of motion intact Strength 5/5 on hip abduction, adduction, flexion and extension. Negative log-roll test, Negative Faber test, Negative piriformis stretch, negative Fadir.  Right hip: No deformity, instability. FROM with 5/5 strength. No tenderness to palpation. NVI distally.   Assessment & Plan:   1. Gluteal pain due to piriformis muscle spasm Mr.Acord presents with gluteal pain that began with heavy exercise. Description of pain appears purely muscular in nature and physical exam confirms lack of hip joint pathology. Location of pain appears to align with piriformis despite negative stretch test. Recommend regular stretch and reduce exercise load.  - Instructions on piriformis stretch exercises provided

## 2019-05-14 ENCOUNTER — Encounter: Payer: Self-pay | Admitting: Family Medicine

## 2019-05-27 ENCOUNTER — Other Ambulatory Visit: Payer: Self-pay | Admitting: Family Medicine

## 2019-05-27 ENCOUNTER — Telehealth: Payer: Self-pay | Admitting: *Deleted

## 2019-05-27 DIAGNOSIS — E039 Hypothyroidism, unspecified: Secondary | ICD-10-CM

## 2019-05-27 NOTE — Telephone Encounter (Signed)
Requested medication (s) are due for refill today: yes  Requested medication (s) are on the active medication list: yes  Last refill:  02/20/2019  Future visit scheduled: no  Notes to clinic: review for refill   Requested Prescriptions  Pending Prescriptions Disp Refills   levothyroxine (SYNTHROID) 75 MCG tablet [Pharmacy Med Name: LEVOTHYROXINE 75 MCG TABLET 75 TAB] 90 tablet 3    Sig: Take 1 tablet (75 mcg total) by mouth daily before breakfast.     Endocrinology:  Hypothyroid Agents Failed - 05/27/2019  8:06 AM      Failed - TSH needs to be rechecked within 3 months after an abnormal result. Refill until TSH is due.      Passed - TSH in normal range and within 360 days    TSH  Date Value Ref Range Status  06/26/2018 4.180 0.450 - 4.500 uIU/mL Final         Passed - Valid encounter within last 12 months    Recent Outpatient Visits          11 months ago Hypothyroidism, unspecified type   [facility], Ranell Patrick, MD   11 months ago Hypothyroidism, unspecified type   [facility], Ranell Patrick, MD   1 year ago Intermittent lightheadedness   [facility] [place], PA-C   2 years ago Acute prostatitis   [facility] [facility], PA-C   2 years ago Elevated PSA   [facility], Gelene Mink, [place]

## 2019-05-27 NOTE — Telephone Encounter (Signed)
Please schedule patient for an appointment.  Prescription sent in must have keep appointment for any additional refills

## 2019-05-27 NOTE — Telephone Encounter (Signed)
Refilled synthroid. Please schedule appt.

## 2019-05-28 MED FILL — LEVOTHYROXINE 75 MCG TABLET: 75 | 90 days supply | Qty: 90 | Fill #0

## 2019-05-28 NOTE — Telephone Encounter (Signed)
LVM to schedule appt

## 2019-06-07 ENCOUNTER — Ambulatory Visit (INDEPENDENT_AMBULATORY_CARE_PROVIDER_SITE_OTHER): Payer: PPO | Admitting: Family Medicine

## 2019-06-07 ENCOUNTER — Other Ambulatory Visit: Payer: Self-pay

## 2019-06-07 ENCOUNTER — Encounter: Payer: Self-pay | Admitting: Family Medicine

## 2019-06-07 VITALS — BP 132/80 | HR 56 | Temp 98.6°F | Wt 133.8 lb

## 2019-06-07 DIAGNOSIS — Z125 Encounter for screening for malignant neoplasm of prostate: Secondary | ICD-10-CM | POA: Diagnosis not present

## 2019-06-07 DIAGNOSIS — E039 Hypothyroidism, unspecified: Secondary | ICD-10-CM

## 2019-06-07 DIAGNOSIS — Z131 Encounter for screening for diabetes mellitus: Secondary | ICD-10-CM

## 2019-06-07 MED ORDER — LEVOTHYROXINE SODIUM 75 MCG PO TABS: 75.0000 ug | ORAL_TABLET | Freq: Every day | ORAL | 0 refills | Status: DC

## 2019-06-07 MED ORDER — LEVOTHYROXINE SODIUM 75 MCG PO TABS: 75.0000 ug | ORAL_TABLET | Freq: Every day | ORAL | 3 refills | Status: DC

## 2019-06-07 NOTE — Patient Instructions (Addendum)
° ° ° °  If you have lab work done today you will be contacted with your lab results within the next 2 weeks.  If you have not heard from us then please contact us. The fastest way to get your results is to register for My Chart. ° ° °IF you received an x-ray today, you will receive an invoice from [facility]. Please contact [facility] at [phone] with questions or concerns regarding your invoice.  ° °IF you received labwork today, you will receive an invoice from [facility]. Please contact [facility] at [phone] with questions or concerns regarding your invoice.  ° °Our billing staff will not be able to assist you with questions regarding bills from these companies. ° °You will be contacted with the lab results as soon as they are available. The fastest way to get your results is to activate your My Chart account. Instructions are located on the last page of this paperwork. If you have not heard from us regarding the results in 2 weeks, please contact this office. °  ° ° ° °

## 2019-06-07 NOTE — Progress Notes (Signed)
Subjective:    Patient ID: James Mayo, male    DOB: 1951/01/09, 68 y.o.   MRN: [id number]  HPI James Mayo is a 68 y.o. male Presents today for: Chief Complaint  Patient presents with  . Hyperlipidemia    here tofay for a f/u and medication refill   Hypothyroidism: Lab Results  Component Value Date   TSH 4.180 06/26/2018  Taking medication daily- 18mcg.  No new hot or cold intolerance. No new hair or skin changes, heart palpitations or new fatigue. No new weight changes.   Cardiology:  Dr. Irish Lack.  appt in November.   Cardiothoracic: Dr Servando Snare, appt 03/07/19.  Aortic valve replacement and Wheat procedure for ascending aortic dilation 2 years ago.  Plan for repeat CTA in June 2021.  Needs dental prophylaxis.   HM: Up to date on colonoscopy.  Prostate: PSA 1.0 in 08/2017. Urinating well, nocturia once per night. R/B of testing discussed. Chose PSA only.   Immunization History  Administered Date(s) Administered  . Influenza, High Dose Seasonal PF 06/15/2018  . Influenza,inj,Quad PF,6+ Mos 08/05/2014, 09/16/2015, 08/25/2016, 08/18/2017  . Pneumococcal Conjugate-13 08/25/2016  . Pneumococcal Polysaccharide-23 06/15/2018  . Tdap 09/16/2015  . Zoster Recombinat (Shingrix) 07/16/2018, 09/27/2018  flu vaccine few weeks ago.   Lab Results  Component Value Date   CHOL 195 08/25/2016   HDL 101 08/25/2016   LDLCALC 85 08/25/2016   TRIG 44 08/25/2016   CHOLHDL 1.9 08/25/2016    Review of Systems Per HPI.     Objective:   Physical Exam Vitals signs reviewed.  Constitutional:      Appearance: He is well-developed.  HENT:     Head: Normocephalic and atraumatic.  Eyes:     Pupils: Pupils are equal, round, and reactive to light.  Neck:     Thyroid: No thyroid mass or thyromegaly.     Vascular: No carotid bruit or JVD.  Cardiovascular:     Rate and Rhythm: Normal rate and regular rhythm.     Heart sounds: Normal heart sounds. No murmur.  Pulmonary:   Effort: Pulmonary effort is normal.     Breath sounds: Normal breath sounds. No rales.  Skin:    General: Skin is warm and dry.  Neurological:     Mental Status: He is alert and oriented to person, place, and time.    Vitals:   06/07/19 1050  BP: 132/80  Pulse: (!) 56  Temp: 98.6 F (37 C)  TempSrc: Oral  SpO2: 97%  Weight: 133 lb 12.8 oz (60.7 kg)        Assessment & Plan:   [name] is a 68 y.o. male Hypothyroidism, unspecified type - Plan: levothyroxine (SYNTHROID) 75 MCG tablet, DISCONTINUED: levothyroxine (SYNTHROID) 75 MCG tablet  -Stable, tolerating current regimen.  Labs pending  Screening for diabetes mellitus - Plan: Comprehensive metabolic panel  Screening for prostate cancer - Plan: PSA  - We discussed pros and cons of prostate cancer screening, and after this discussion, he chose to have screening done with PSA obtained only, limitations with partial testing discussed.  Meds ordered this encounter  Medications  . DISCONTD: levothyroxine (SYNTHROID) 75 MCG tablet    Sig: Take 1 tablet (75 mcg total) by mouth daily before breakfast.    Dispense:  90 tablet    Refill:  0  . levothyroxine (SYNTHROID) 75 MCG tablet    Sig: Take 1 tablet (75 mcg total) by mouth daily before breakfast.    Dispense:  90 tablet    Refill:  3   Patient Instructions     If you have lab work done today you will be contacted with your lab results within the next 2 weeks.  If you have not heard from [place] then please contact us. The fastest way to get your results is to register for My Chart.   IF you received an x-ray today, you will receive an invoice from [facility]. Please contact [facility] at [phone] with questions or concerns regarding your invoice.   IF you received labwork today, you will receive an invoice from [facility]. Please contact [facility] at [phone] with questions or concerns regarding your invoice.   Our billing staff will not be  able to assist you with questions regarding bills from these companies.  You will be contacted with the lab results as soon as they are available. The fastest way to get your results is to activate your My Chart account. Instructions are located on the last page of this paperwork. If you have not heard from [place] regarding the results in 2 weeks, please contact this office.       Signed,   Merri Ray, MD [facility].  06/08/19 10:38 AM

## 2019-06-08 ENCOUNTER — Encounter: Payer: Self-pay | Admitting: Family Medicine

## 2019-06-08 LAB — SPECIMEN STATUS

## 2019-06-12 ENCOUNTER — Ambulatory Visit: Payer: PPO | Admitting: Family Medicine

## 2019-06-14 LAB — COMPREHENSIVE METABOLIC PANEL

## 2019-06-14 LAB — PSA

## 2019-07-24 ENCOUNTER — Encounter: Payer: Self-pay | Admitting: Registered Nurse

## 2019-07-24 ENCOUNTER — Ambulatory Visit (INDEPENDENT_AMBULATORY_CARE_PROVIDER_SITE_OTHER): Payer: PPO | Admitting: Registered Nurse

## 2019-07-24 ENCOUNTER — Other Ambulatory Visit: Payer: Self-pay

## 2019-07-24 VITALS — BP 147/85 | HR 60 | Temp 98.0°F | Resp 16 | Ht 66.93 in | Wt 131.0 lb

## 2019-07-24 DIAGNOSIS — Z13228 Encounter for screening for other metabolic disorders: Secondary | ICD-10-CM | POA: Diagnosis not present

## 2019-07-24 DIAGNOSIS — Z0000 Encounter for general adult medical examination without abnormal findings: Secondary | ICD-10-CM

## 2019-07-24 DIAGNOSIS — Z13220 Encounter for screening for lipoid disorders: Secondary | ICD-10-CM

## 2019-07-24 DIAGNOSIS — Z1329 Encounter for screening for other suspected endocrine disorder: Secondary | ICD-10-CM | POA: Diagnosis not present

## 2019-07-24 DIAGNOSIS — Z130 Encounter for screening for diseases of the blood and blood-forming organs and certain disorders involving the immune mechanism: Secondary | ICD-10-CM

## 2019-07-24 DIAGNOSIS — Z0001 Encounter for general adult medical examination with abnormal findings: Secondary | ICD-10-CM

## 2019-07-24 NOTE — Progress Notes (Signed)
Established Patient Office Visit  Subjective:  Patient ID: James Mayo, male    DOB: 02/05/1951  Age: 68 y.o. MRN: [id number]  CC:  Chief Complaint  Patient presents with  . Annual Exam    HPI [name] presents for CPE. Usually a patient of Dr. Carlota Raspberry, who unfortunately is booked. Pt has a significant cardiac history, for which he follows up with both his cardiologist Dr. Irish Lack and his cardiac surgeon Dr. Servando Snare. No current symptoms. Overall feels well today, no urgent complaints   Past Medical History:  Diagnosis Date  . Aortic valve disorder 03/06/2017    S/p AVR with Edwards pericardial tissue valve and replacement of the ascending aorta with a 32 mm Hemashield Side Arm Graft  . BASAL CELL CARCINOMA, FACE 08/19/2009   Qualifier: Diagnosis of By: Oneida Alar MD, KARL    . BASAL CELL CARCINOMA, FACE 08/19/2009   Qualifier: Diagnosis of  By: Oneida Alar MD, KARL    . Coronary artery disease   . Metatarsalgia of left foot 04/29/2014  . Neck pain, bilateral 11/08/2013  . PAF (paroxysmal atrial fibrillation) ([facility] [place]) 03/09/2017  . RHINITIS, ALLERGIC 11/09/2006   Qualifier: Diagnosis of  By: Samara Snide    . SHOULDER PAIN, RIGHT 05/06/2009   Qualifier: Diagnosis of  By: Oneida Alar MD, KARL    . Thoracic ascending aortic aneurysm ([place])   . Thyroid condition     Past Surgical History:  Procedure Laterality Date  . AORTIC VALVE REPLACEMENT  03/06/2017   Procedure: AORTIC VALVE REPLACEMENT (AVR) using Magna Ease valve Size 26mm;  Surgeon: Grace Isaac, MD;  Location: [place];  Service: Open Heart Surgery;;  . CARDIAC CATHETERIZATION    . CARDIOVERSION N/A 04/14/2017   Procedure: CARDIOVERSION;  Surgeon: Larey Dresser, MD;  Location: [facility] ENDOSCOPY;  Service: Cardiovascular;  Laterality: N/A;  . EYE SURGERY Bilateral    LASIK 10 years  . REPLACEMENT ASCENDING AORTA N/A 03/06/2017   Procedure: REPLACEMENT ASCENDING AORTA using 35mm Hemashield Graft - with Hypothermic Circulatory Arrest;   Surgeon: Grace Isaac, MD;  Location: [place];  Service: Open Heart Surgery;  Laterality: N/A;  . RIGHT/LEFT HEART CATH AND CORONARY ANGIOGRAPHY N/A 01/25/2017   Procedure: Right/Left Heart Cath and Coronary Angiography;  Surgeon: Troy Sine, MD;  Location: [place] CV LAB;  Service: Cardiovascular;  Laterality: N/A;  . TEE WITHOUT CARDIOVERSION N/A 03/06/2017   Procedure: TRANSESOPHAGEAL ECHOCARDIOGRAM (TEE);  Surgeon: Grace Isaac, MD;  Location: [place];  Service: Open Heart Surgery;  Laterality: N/A;  . TEE WITHOUT CARDIOVERSION N/A 04/14/2017   Procedure: TRANSESOPHAGEAL ECHOCARDIOGRAM (TEE);  Surgeon: Larey Dresser, MD;  Location: [facility] ENDOSCOPY;  Service: Cardiovascular;  Laterality: N/A;    Family History  Problem Relation Age of Onset  . Hypertension Mother   . Mental illness Brother     Social History   Socioeconomic History  . Marital status: Married    Spouse name: Not on file  . Number of children: Not on file  . Years of education: Not on file  . Highest education level: Not on file  Occupational History  . Not on file  Social Needs  . Financial resource strain: Not hard at all  . Food insecurity    Worry: Never true    Inability: Never true  . Transportation needs    Medical: No    Non-medical: No  Tobacco Use  . Smoking status: Never Smoker  . Smokeless tobacco: Never Used  Substance  and Sexual Activity  . Alcohol use: Yes    Comment: 2 beers - 2-3 times/ weeek  . Drug use: No  . Sexual activity: Not Currently  Lifestyle  . Physical activity    Days per week: 4 days    Minutes per session: 40 min  . Stress: Only a little  Relationships  . Social Herbalist on phone: Not on file    Gets together: Not on file    Attends religious service: Not on file    Active member of club or organization: Not on file    Attends meetings of clubs or organizations: Not on file    Relationship status: Not on file  . Intimate partner violence     Fear of current or ex partner: Not on file    Emotionally abused: Not on file    Physically abused: Not on file    Forced sexual activity: Not on file  Other Topics Concern  . Not on file  Social History Narrative   ** Merged History Encounter **        Outpatient Medications Prior to Visit  Medication Sig Dispense Refill  . levothyroxine (SYNTHROID) 75 MCG tablet Take 1 tablet (75 mcg total) by mouth daily before breakfast. 90 tablet 3   No facility-administered medications prior to visit.     Allergies  Allergen Reactions  . No Known Allergies     ROS Review of Systems  Constitutional: Negative.   HENT: Negative.   Eyes: Negative.   Respiratory: Negative.   Cardiovascular: Negative.   Gastrointestinal: Negative.   Endocrine: Negative.   Genitourinary: Negative.   Musculoskeletal: Negative.   Skin: Negative.   Allergic/Immunologic: Negative.   Neurological: Negative.   Hematological: Negative.   Psychiatric/Behavioral: Negative.   All other systems reviewed and are negative.     Objective:    Physical Exam  Constitutional: He is oriented to person, place, and time. He appears well-developed and well-nourished. No distress.  HENT:  Head: Normocephalic and atraumatic.  Right Ear: External ear normal.  Left Ear: External ear normal.  Nose: Nose normal.  Mouth/Throat: Oropharynx is clear and moist. No oropharyngeal exudate.  Eyes: Pupils are equal, round, and reactive to light. Conjunctivae and EOM are normal. Right eye exhibits no discharge. Left eye exhibits no discharge. No scleral icterus.  Neck: Normal range of motion. Neck supple. No tracheal deviation present. No thyromegaly present.  Cardiovascular: Normal rate, regular rhythm and intact distal pulses. Exam reveals no gallop and no friction rub.  Murmur (systolic) heard. Pulmonary/Chest: Effort normal and breath sounds normal. No respiratory distress. He has no wheezes. He has no rales. He exhibits no  tenderness.  Abdominal: Soft. Bowel sounds are normal. He exhibits no distension and no mass. There is no abdominal tenderness. There is no rebound and no guarding.  Musculoskeletal: Normal range of motion.        General: No tenderness, deformity or edema.  Lymphadenopathy:    He has no cervical adenopathy.  Neurological: He is alert and oriented to person, place, and time. No cranial nerve deficit. He exhibits normal muscle tone. Coordination normal.  Skin: Skin is warm and dry. No rash noted. He is not diaphoretic. No erythema. No pallor.  Psychiatric: He has a normal mood and affect. His behavior is normal. Judgment and thought content normal.  Nursing note and vitals reviewed.   BP (!) 147/85   Pulse 60   Temp 98 F (36.7 C) (Oral)  Resp 16   Ht 5' 6.93" (1.7 m)   Wt 131 lb (59.4 kg)   SpO2 98%   BMI 20.56 kg/m  Wt Readings from Last 3 Encounters:  07/24/19 131 lb (59.4 kg)  06/07/19 133 lb 12.8 oz (60.7 kg)  05/13/19 135 lb (61.2 kg)     There are no preventive care reminders to display for this patient.  There are no preventive care reminders to display for this patient.  Lab Results  Component Value Date   TSH 4.180 06/26/2018   Lab Results  Component Value Date   WBC WILL FOLLOW 06/07/2019   HGB WILL FOLLOW 06/07/2019   HCT WILL FOLLOW 06/07/2019   MCV WILL FOLLOW 06/07/2019   PLT WILL FOLLOW 06/07/2019   Lab Results  Component Value Date   NA CANCELED 06/07/2019   K CANCELED 06/07/2019   CO2 CANCELED 06/07/2019   GLUCOSE CANCELED 06/07/2019   BUN CANCELED 06/07/2019   CREATININE CANCELED 06/07/2019   BILITOT CANCELED 06/07/2019   ALKPHOS CANCELED 06/07/2019   AST CANCELED 06/07/2019   ALT CANCELED 06/07/2019   PROT CANCELED 06/07/2019   ALBUMIN CANCELED 06/07/2019   CALCIUM CANCELED 06/07/2019   ANIONGAP 7 04/20/2017   Lab Results  Component Value Date   CHOL 195 08/25/2016   Lab Results  Component Value Date   HDL 101 08/25/2016   Lab  Results  Component Value Date   LDLCALC 85 08/25/2016   Lab Results  Component Value Date   TRIG 44 08/25/2016   Lab Results  Component Value Date   CHOLHDL 1.9 08/25/2016   Lab Results  Component Value Date   HGBA1C 5.5 03/02/2017      Assessment & Plan:   Problem List Items Addressed This Visit    None    Visit Diagnoses    Screening for endocrine, metabolic and immunity disorder    -  Primary   Relevant Orders   CBC with Differential   TSH   Hemoglobin A1c   Lipid screening       Relevant Orders   Lipid panel   Encounter for general adult medical examination with abnormal findings          No orders of the defined types were placed in this encounter.   Follow-up: Return in 1 year (on 07/23/2020) for CPE and labs with myself or PCP Dr. Carlota Raspberry.   PLAN  Pt in great physical shape, no abnormal findings on exam save for some actinic keratoses - he is following up with his dermatologist in the next month - and the systolic murmur, for which he is already being followed by cardiology.  Labs collected, will follow up with results.  Patient encouraged to call clinic with any questions, comments, or concerns.   Maximiano Coss, NP

## 2019-07-24 NOTE — Patient Instructions (Addendum)
   If you have lab work done today you will be contacted with your lab results within the next 2 weeks.  If you have not heard from us then please contact us. The fastest way to get your results is to register for My Chart.   IF you received an x-ray today, you will receive an invoice from [facility]. Please contact [facility] at [phone] with questions or concerns regarding your invoice.   IF you received labwork today, you will receive an invoice from [facility]. Please contact [facility] at [phone] with questions or concerns regarding your invoice.   Our billing staff will not be able to assist you with questions regarding bills from these companies.  You will be contacted with the lab results as soon as they are available. The fastest way to get your results is to activate your My Chart account. Instructions are located on the last page of this paperwork. If you have not heard from us regarding the results in 2 weeks, please contact this office.       Health Maintenance, Male Adopting a healthy lifestyle and getting preventive care are important in promoting health and wellness. Ask your health care provider about:  The right schedule for you to have regular tests and exams.  Things you can do on your own to prevent diseases and keep yourself healthy. What should I know about diet, weight, and exercise? Eat a healthy diet   Eat a diet that includes plenty of vegetables, fruits, low-fat dairy products, and lean protein.  Do not eat a lot of foods that are high in solid fats, added sugars, or sodium. Maintain a healthy weight Body mass index (BMI) is a measurement that can be used to identify possible weight problems. It estimates body fat based on height and weight. Your health care provider can help determine your BMI and help you achieve or maintain a healthy weight. Get regular exercise Get regular exercise. This is one of the most important things  you can do for your health. Most adults should:  Exercise for at least 150 minutes each week. The exercise should increase your heart rate and make you sweat (moderate-intensity exercise).  Do strengthening exercises at least twice a week. This is in addition to the moderate-intensity exercise.  Spend less time sitting. Even light physical activity can be beneficial. Watch cholesterol and blood lipids Have your blood tested for lipids and cholesterol at 68 years of age, then have this test every 5 years. You may need to have your cholesterol levels checked more often if:  Your lipid or cholesterol levels are high.  You are older than 68 years of age.  You are at high risk for heart disease. What should I know about cancer screening? Many types of cancers can be detected early and may often be prevented. Depending on your health history and family history, you may need to have cancer screening at various ages. This may include screening for:  Colorectal cancer.  Prostate cancer.  Skin cancer.  Lung cancer. What should I know about heart disease, diabetes, and high blood pressure? Blood pressure and heart disease  High blood pressure causes heart disease and increases the risk of stroke. This is more likely to develop in people who have high blood pressure readings, are of African descent, or are overweight.  Talk with your health care provider about your target blood pressure readings.  Have your blood pressure checked: ? Every 3-5 years if you are   18-39 years of age. ? Every year if you are 40 years old or older.  If you are between the ages of 65 and 75 and are a current or former smoker, ask your health care provider if you should have a one-time screening for abdominal aortic aneurysm (AAA). Diabetes Have regular diabetes screenings. This checks your fasting blood sugar level. Have the screening done:  Once every three years after age 45 if you are at a normal weight and  have a low risk for diabetes.  More often and at a younger age if you are overweight or have a high risk for diabetes. What should I know about preventing infection? Hepatitis B If you have a higher risk for hepatitis B, you should be screened for this virus. Talk with your health care provider to find out if you are at risk for hepatitis B infection. Hepatitis C Blood testing is recommended for:  Everyone born from 1945 through 1965.  Anyone with known risk factors for hepatitis C. Sexually transmitted infections (STIs)  You should be screened each year for STIs, including gonorrhea and chlamydia, if: ? You are sexually active and are younger than 68 years of age. ? You are older than 68 years of age and your health care provider tells you that you are at risk for this type of infection. ? Your sexual activity has changed since you were last screened, and you are at increased risk for chlamydia or gonorrhea. Ask your health care provider if you are at risk.  Ask your health care provider about whether you are at high risk for HIV. Your health care provider may recommend a prescription medicine to help prevent HIV infection. If you choose to take medicine to prevent HIV, you should first get tested for HIV. You should then be tested every 3 months for as long as you are taking the medicine. Follow these instructions at home: Lifestyle  Do not use any products that contain nicotine or tobacco, such as cigarettes, e-cigarettes, and chewing tobacco. If you need help quitting, ask your health care provider.  Do not use street drugs.  Do not share needles.  Ask your health care provider for help if you need support or information about quitting drugs. Alcohol use  Do not drink alcohol if your health care provider tells you not to drink.  If you drink alcohol: ? Limit how much you have to 0-2 drinks a day. ? Be aware of how much alcohol is in your drink. In the [place] one drink equals  one 12 oz bottle of beer (355 mL), one 5 oz glass of wine (148 mL), or one 1 oz glass of hard liquor (44 mL). General instructions  Schedule regular health, dental, and eye exams.  Stay current with your vaccines.  Tell your health care provider if: ? You often feel depressed. ? You have ever been abused or do not feel safe at home. Summary  Adopting a healthy lifestyle and getting preventive care are important in promoting health and wellness.  Follow your health care provider's instructions about healthy diet, exercising, and getting tested or screened for diseases.  Follow your health care provider's instructions on monitoring your cholesterol and blood pressure. This information is not intended to replace advice given to you by your health care provider. Make sure you discuss any questions you have with your health care provider. Document Released: 02/25/2008 Document Revised: 08/22/2018 Document Reviewed: 08/22/2018 Elsevier Patient Education  2020 [employer]       Why follow it? Research shows. . Those who follow the Mediterranean diet have a reduced risk of heart disease  . The diet is associated with a reduced incidence of Parkinson's and Alzheimer's diseases . People following the diet may have longer life expectancies and lower rates of chronic diseases  . The Dietary Guidelines for Americans recommends the Mediterranean diet as an eating plan to promote health and prevent disease  What Is the Mediterranean Diet?  . Healthy eating plan based on typical foods and recipes of Mediterranean-style cooking . The diet is primarily a plant based diet; these foods should make up a majority of meals   Starches - Plant based foods should make up a majority of meals - They are an important sources of vitamins, minerals, energy, antioxidants, and fiber - Choose whole grains, foods high in fiber and minimally processed items  - Typical grain sources include wheat, oats, barley, corn,  brown rice, bulgar, farro, millet, polenta, couscous  - Various types of beans include chickpeas, lentils, fava beans, black beans, white beans   Fruits  Veggies - Large quantities of antioxidant rich fruits & veggies; 6 or more servings  - Vegetables can be eaten raw or lightly drizzled with oil and cooked  - Vegetables common to the traditional Mediterranean Diet include: artichokes, arugula, beets, broccoli, brussel sprouts, cabbage, carrots, celery, collard greens, cucumbers, eggplant, kale, leeks, lemons, lettuce, mushrooms, okra, onions, peas, peppers, potatoes, pumpkin, radishes, rutabaga, shallots, spinach, sweet potatoes, turnips, zucchini - Fruits common to the Mediterranean Diet include: apples, apricots, avocados, cherries, clementines, dates, figs, grapefruits, grapes, melons, nectarines, oranges, peaches, pears, pomegranates, strawberries, tangerines  Fats - Replace butter and margarine with healthy oils, such as olive oil, canola oil, and tahini  - Limit nuts to no more than a handful a day  - Nuts include walnuts, almonds, pecans, pistachios, pine nuts  - Limit or avoid candied, honey roasted or heavily salted nuts - Olives are central to the Mediterranean diet - can be eaten whole or used in a variety of dishes   Meats Protein - Limiting red meat: no more than a few times a month - When eating red meat: choose lean cuts and keep the portion to the size of deck of cards - Eggs: approx. 0 to 4 times a week  - Fish and lean poultry: at least 2 a week  - Healthy protein sources include, chicken, turkey, lean beef, lamb - Increase intake of seafood such as tuna, salmon, trout, mackerel, shrimp, scallops - Avoid or limit high fat processed meats such as sausage and bacon  Dairy - Include moderate amounts of low fat dairy products  - Focus on healthy dairy such as fat free yogurt, skim milk, low or reduced fat cheese - Limit dairy products higher in fat such as whole or 2% milk, cheese,  ice cream  Alcohol - Moderate amounts of red wine is ok  - No more than 5 oz daily for women (all ages) and men older than age 65  - No more than 10 oz of wine daily for men younger than 65  Other - Limit sweets and other desserts  - Use herbs and spices instead of salt to flavor foods  - Herbs and spices common to the traditional Mediterranean Diet include: basil, bay leaves, chives, cloves, cumin, fennel, garlic, lavender, marjoram, mint, oregano, parsley, pepper, rosemary, sage, savory, sumac, tarragon, thyme   It's not just a diet, it's a lifestyle:  . The Mediterranean diet   includes lifestyle factors typical of those in the region  . Foods, drinks and meals are best eaten with others and savored . Daily physical activity is important for overall good health . This could be strenuous exercise like running and aerobics . This could also be more leisurely activities such as walking, housework, yard-work, or taking the stairs . Moderation is the key; a balanced and healthy diet accommodates most foods and drinks . Consider portion sizes and frequency of consumption of certain foods   Meal Ideas & Options:  . Breakfast:  o Whole wheat toast or whole wheat English muffins with peanut butter & hard boiled egg o Steel cut oats topped with apples & cinnamon and skim milk  o Fresh fruit: banana, strawberries, melon, berries, peaches  o Smoothies: strawberries, bananas, greek yogurt, peanut butter o Low fat greek yogurt with blueberries and granola  o Egg white omelet with spinach and mushrooms o Breakfast couscous: whole wheat couscous, apricots, skim milk, cranberries  . Sandwiches:  o Hummus and grilled vegetables (peppers, zucchini, squash) on whole wheat bread   o Grilled chicken on whole wheat pita with lettuce, tomatoes, cucumbers or tzatziki  o Tuna salad on whole wheat bread: tuna salad made with greek yogurt, olives, red peppers, capers, green onions o Garlic rosemary lamb pita:  lamb sauted with garlic, rosemary, salt & pepper; add lettuce, cucumber, greek yogurt to pita - flavor with lemon juice and black pepper  . Seafood:  o Mediterranean grilled salmon, seasoned with garlic, basil, parsley, lemon juice and black pepper o Shrimp, lemon, and spinach whole-grain pasta salad made with low fat greek yogurt  o Seared scallops with lemon orzo  o Seared tuna steaks seasoned salt, pepper, coriander topped with tomato mixture of olives, tomatoes, olive oil, minced garlic, parsley, green onions and cappers  . Meats:  o Herbed greek chicken salad with kalamata olives, cucumber, feta  o Red bell peppers stuffed with spinach, bulgur, lean ground beef (or lentils) & topped with feta   o Kebabs: skewers of chicken, tomatoes, onions, zucchini, squash  o Turkey burgers: made with red onions, mint, dill, lemon juice, feta cheese topped with roasted red peppers . Vegetarian o Cucumber salad: cucumbers, artichoke hearts, celery, red onion, feta cheese, tossed in olive oil & lemon juice  o Hummus and whole grain pita points with a greek salad (lettuce, tomato, feta, olives, cucumbers, red onion) o Lentil soup with celery, carrots made with vegetable broth, garlic, salt and pepper  o Tabouli salad: parsley, bulgur, mint, scallions, cucumbers, tomato, radishes, lemon juice, olive oil, salt and pepper.       Fat and Cholesterol Restricted Eating Plan Eating a diet that limits fat and cholesterol may help lower your risk for heart disease and other conditions. Your body needs fat and cholesterol for basic functions, but eating too much of these things can be harmful to your health. Your health care provider may order lab tests to check your blood fat (lipid) and cholesterol levels. This helps your health care provider understand your risk for certain conditions and whether you need to make diet changes. Work with your health care provider or dietitian to make an eating plan that is right for  you. Your plan includes:  Limit your fat intake to ______% or less of your total calories a day.  Limit your saturated fat intake to ______% or less of your total calories a day.  Limit the amount of cholesterol in your diet to less   than _________mg a day.  Eat ___________ g of fiber a day. What are tips for following this plan? General guidelines   If you are overweight, work with your health care provider to lose weight safely. Losing just 5-10% of your body weight can improve your overall health and help prevent diseases such as diabetes and heart disease.  Avoid: ? Foods with added sugar. ? Fried foods. ? Foods that contain partially hydrogenated oils, including stick margarine, some tub margarines, cookies, crackers, and other baked goods.  Limit alcohol intake to no more than 1 drink a day for nonpregnant women and 2 drinks a day for men. One drink equals 12 oz of beer, 5 oz of wine, or 1 oz of hard liquor. Reading food labels  Check food labels for: ? Trans fats, partially hydrogenated oils, or high amounts of saturated fat. Avoid foods that contain saturated fat and trans fat. ? The amount of cholesterol in each serving. Try to eat no more than 200 mg of cholesterol each day. ? The amount of fiber in each serving. Try to eat at least 20-30 g of fiber each day.  Choose foods with healthy fats, such as: ? Monounsaturated and polyunsaturated fats. These include olive and canola oil, flaxseeds, walnuts, almonds, and seeds. ? Omega-3 fats. These are found in foods such as salmon, mackerel, sardines, tuna, flaxseed oil, and ground flaxseeds.  Choose grain products that have whole grains. Look for the word "whole" as the first word in the ingredient list. Cooking  Cook foods using methods other than frying. Baking, boiling, grilling, and broiling are some healthy options.  Eat more home-cooked food and less restaurant, buffet, and fast food.  Avoid cooking using saturated  fats. ? Animal sources of saturated fats include meats, butter, and cream. ? Plant sources of saturated fats include palm oil, palm kernel oil, and coconut oil. Meal planning   At meals, imagine dividing your plate into fourths: ? Fill one-half of your plate with vegetables and green salads. ? Fill one-fourth of your plate with whole grains. ? Fill one-fourth of your plate with lean protein foods.  Eat fish that is high in omega-3 fats at least two times a week.  Eat more foods that contain fiber, such as whole grains, beans, apples, broccoli, carrots, peas, and barley. These foods help promote healthy cholesterol levels in the blood. Recommended foods Grains  Whole grains, such as whole wheat or whole grain breads, crackers, cereals, and pasta. Unsweetened oatmeal, bulgur, barley, quinoa, or brown rice. Corn or whole wheat flour tortillas. Vegetables  Fresh or frozen vegetables (raw, steamed, roasted, or grilled). Green salads. Fruits  All fresh, canned (in natural juice), or frozen fruits. Meats and other protein foods  Ground beef (85% or leaner), grass-fed beef, or beef trimmed of fat. Skinless chicken or turkey. Ground chicken or turkey. Pork trimmed of fat. All fish and seafood. Egg whites. Dried beans, peas, or lentils. Unsalted nuts or seeds. Unsalted canned beans. Natural nut butters without added sugar and oil. Dairy  Low-fat or nonfat dairy products, such as skim or 1% milk, 2% or reduced-fat cheeses, low-fat and fat-free ricotta or cottage cheese, or plain low-fat and nonfat yogurt. Fats and oils  Tub margarine without trans fats. Light or reduced-fat mayonnaise and salad dressings. Avocado. Olive, canola, sesame, or safflower oils. The items listed above may not be a complete list of recommended foods or beverages. Contact your dietitian for more options. Foods to avoid Grains  White bread.   White pasta. White rice. Cornbread. Bagels, pastries, and croissants.  Crackers and snack foods that contain trans fat and hydrogenated oils. Vegetables  Vegetables cooked in cheese, cream, or butter sauce. Fried vegetables. Fruits  Canned fruit in heavy syrup. Fruit in cream or butter sauce. Fried fruit. Meats and other protein foods  Fatty cuts of meat. Ribs, chicken wings, bacon, sausage, bologna, salami, chitterlings, fatback, hot dogs, bratwurst, and packaged lunch meats. Liver and organ meats. Whole eggs and egg yolks. Chicken and turkey with skin. Fried meat. Dairy  Whole or 2% milk, cream, half-and-half, and cream cheese. Whole milk cheeses. Whole-fat or sweetened yogurt. Full-fat cheeses. Nondairy creamers and whipped toppings. Processed cheese, cheese spreads, and cheese curds. Beverages  Alcohol. Sugar-sweetened drinks such as sodas, lemonade, and fruit drinks. Fats and oils  Butter, stick margarine, lard, shortening, ghee, or bacon fat. Coconut, palm kernel, and palm oils. Sweets and desserts  Corn syrup, sugars, honey, and molasses. Candy. Jam and jelly. Syrup. Sweetened cereals. Cookies, pies, cakes, donuts, muffins, and ice cream. The items listed above may not be a complete list of foods and beverages to avoid. Contact your dietitian for more information. Summary  Your body needs fat and cholesterol for basic functions. However, eating too much of these things can be harmful to your health.  Work with your health care provider and dietitian to follow a diet low in fat and cholesterol. Doing this may help lower your risk for heart disease and other conditions.  Choose healthy fats, such as monounsaturated and polyunsaturated fats, and foods high in omega-3 fatty acids.  Eat fiber-rich foods, such as whole grains, beans, peas, fruits, and vegetables.  Limit or avoid alcohol, fried foods, and foods high in saturated fats, partially hydrogenated oils, and sugar. This information is not intended to replace advice given to you by your health  care provider. Make sure you discuss any questions you have with your health care provider. Document Released: 08/29/2005 Document Revised: 08/11/2017 Document Reviewed: 05/16/2017 Elsevier Patient Education  2020 [employer] Inc.  American Heart Association (AHA) Exercise Recommendation  Being physically active is important to prevent heart disease and stroke, the nation's No. 1and No. 5killers. To improve overall cardiovascular health, we suggest at least 150 minutes per week of moderate exercise or 75 minutes per week of vigorous exercise (or a combination of moderate and vigorous activity). Thirty minutes a day, five times a week is an easy goal to remember. You will also experience benefits even if you divide your time into two or three segments of 10 to 15 minutes per day.  For people who would benefit from lowering their blood pressure or cholesterol, we recommend 40 minutes of aerobic exercise of moderate to vigorous intensity three to four times a week to lower the risk for heart attack and stroke.  Physical activity is anything that makes you move your body and burn calories.  This includes things like climbing stairs or playing sports. Aerobic exercises benefit your heart, and include walking, jogging, swimming or biking. Strength and stretching exercises are best for overall stamina and flexibility.  The simplest, positive change you can make to effectively improve your heart health is to start walking. It's enjoyable, free, easy, social and great exercise. A walking program is flexible and boasts high success rates because people can stick with it. It's easy for walking to become a regular and satisfying part of life.   For Overall Cardiovascular Health:  At least 30 minutes of moderate-intensity aerobic activity   at least 5 days per week for a total of 150  OR   At least 25 minutes of vigorous aerobic activity at least 3 days per week for a total of 75 minutes; or a combination of  moderate- and vigorous-intensity aerobic activity  AND   Moderate- to high-intensity muscle-strengthening activity at least 2 days per week for additional health benefits.  For Lowering Blood Pressure and Cholesterol  An average 40 minutes of moderate- to vigorous-intensity aerobic activity 3 or 4 times per week  What if I can't make it to the time goal? Something is always better than nothing! And everyone has to start somewhere. Even if you've been sedentary for years, today is the day you can begin to make healthy changes in your life. If you don't think you'll make it for 30 or 40 minutes, set a reachable goal for today. You can work up toward your overall goal by increasing your time as you get stronger. Don't let all-or-nothing thinking rob you of doing what you can every day.  Source:http://www.heart.org    

## 2019-07-25 ENCOUNTER — Encounter: Payer: Self-pay | Admitting: Family Medicine

## 2019-07-25 ENCOUNTER — Encounter: Payer: Self-pay | Admitting: Registered Nurse

## 2019-07-25 DIAGNOSIS — E039 Hypothyroidism, unspecified: Secondary | ICD-10-CM

## 2019-07-25 LAB — CBC WITH DIFFERENTIAL/PLATELET
Basophils Absolute: 0 10*3/uL (ref 0.0–0.2)
Basos: 1 %
EOS (ABSOLUTE): 0 10*3/uL (ref 0.0–0.4)
Eos: 0 %
Hematocrit: 47.3 % (ref 37.5–51.0)
Hemoglobin: 15.6 g/dL (ref 13.0–17.7)
Immature Grans (Abs): 0 10*3/uL (ref 0.0–0.1)
Immature Granulocytes: 0 %
Lymphocytes Absolute: 0.9 10*3/uL (ref 0.7–3.1)
Lymphs: 14 %
MCH: 32.6 pg (ref 26.6–33.0)
MCHC: 33 g/dL (ref 31.5–35.7)
MCV: 99 fL — ABNORMAL HIGH (ref 79–97)
Monocytes Absolute: 0.4 10*3/uL (ref 0.1–0.9)
Monocytes: 6 %
Neutrophils Absolute: 5.1 10*3/uL (ref 1.4–7.0)
Neutrophils: 79 %
Platelets: 164 10*3/uL (ref 150–450)
RBC: 4.79 x10E6/uL (ref 4.14–5.80)
RDW: 12.6 % (ref 11.6–15.4)
WBC: 6.4 10*3/uL (ref 3.4–10.8)

## 2019-07-25 LAB — LIPID PANEL
Chol/HDL Ratio: 1.8 ratio (ref 0.0–5.0)
Cholesterol, Total: 193 mg/dL (ref 100–199)
HDL: 109 mg/dL (ref >39)
LDL Chol Calc (NIH): 75 mg/dL (ref 0–99)
Triglycerides: 43 mg/dL (ref 0–149)
VLDL Cholesterol Cal: 9 mg/dL (ref 5–40)

## 2019-07-25 LAB — HEMOGLOBIN A1C
Est. average glucose Bld gHb Est-mCnc: 114 mg/dL
Hgb A1c MFr Bld: 5.6 % (ref 4.8–5.6)

## 2019-07-25 LAB — TSH: TSH: 7.38 u[IU]/mL — ABNORMAL HIGH (ref 0.450–4.500)

## 2019-07-25 MED ORDER — LEVOTHYROXINE SODIUM 88 MCG PO TABS: 88.0000 ug | ORAL_TABLET | Freq: Every day | ORAL | 0 refills | Status: DC

## 2019-07-25 MED FILL — LEVOTHYROXINE 88 MCG TABLET: 88 | 90 days supply | Qty: 90 | Fill #0

## 2019-08-11 DIAGNOSIS — Z20828 Contact with and (suspected) exposure to other viral communicable diseases: Secondary | ICD-10-CM | POA: Diagnosis not present

## 2019-08-21 MED FILL — LEVOTHYROXINE 88 MCG TABLET: 88 | 90 days supply | Qty: 90 | Fill #0

## 2019-08-27 DIAGNOSIS — Z85828 Personal history of other malignant neoplasm of skin: Secondary | ICD-10-CM | POA: Diagnosis not present

## 2019-08-27 DIAGNOSIS — Z23 Encounter for immunization: Secondary | ICD-10-CM | POA: Diagnosis not present

## 2019-08-27 DIAGNOSIS — L570 Actinic keratosis: Secondary | ICD-10-CM | POA: Diagnosis not present

## 2019-08-27 DIAGNOSIS — L821 Other seborrheic keratosis: Secondary | ICD-10-CM | POA: Diagnosis not present

## 2019-08-27 MED FILL — FLUOROURACIL 5 % CREA: 5 | 21 days supply | Qty: 40 | Fill #0

## 2019-09-10 ENCOUNTER — Ambulatory Visit: Payer: PPO | Admitting: Family Medicine
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
## 2019-09-17 ENCOUNTER — Encounter: Payer: Self-pay | Admitting: Interventional Cardiology

## 2019-09-17 ENCOUNTER — Ambulatory Visit: Payer: PPO | Admitting: Interventional Cardiology

## 2019-09-17 ENCOUNTER — Other Ambulatory Visit: Payer: Self-pay

## 2019-09-17 VITALS — BP 138/84 | HR 52 | Ht 66.0 in | Wt 138.2 lb

## 2019-09-17 DIAGNOSIS — R030 Elevated blood-pressure reading, without diagnosis of hypertension: Secondary | ICD-10-CM

## 2019-09-17 DIAGNOSIS — I712 Thoracic aortic aneurysm, without rupture, unspecified: Secondary | ICD-10-CM

## 2019-09-17 DIAGNOSIS — Z952 Presence of prosthetic heart valve: Secondary | ICD-10-CM | POA: Diagnosis not present

## 2019-09-17 DIAGNOSIS — I480 Paroxysmal atrial fibrillation: Secondary | ICD-10-CM

## 2019-09-17 NOTE — Patient Instructions (Signed)
Medication Instructions:  Your physician recommends that you continue on your current medications as directed. Please refer to the Current Medication list given to you today.  *If you need a refill on your cardiac medications before your next appointment, please call your pharmacy*  Lab Work: None ordered  If you have labs (blood work) drawn today and your tests are completely normal, you will receive your results only by: Marland Kitchen MyChart Message (if you have MyChart) OR . A paper copy in the mail If you have any lab test that is abnormal or we need to change your treatment, we will call you to review the results.  Testing/Procedures: Your physician has requested that you have an echocardiogram. Echocardiography is a painless test that uses sound waves to create images of your heart. It provides your doctor with information about the size and shape of your heart and how well your heart's chambers and valves are working. This procedure takes approximately one hour. There are no restrictions for this procedure.   Follow-Up: At [facility], you and your health needs are our priority.  As part of our continuing mission to provide you with exceptional heart care, we have created designated Provider Care Teams.  These Care Teams include your primary Cardiologist (physician) and Advanced Practice Providers (APPs -  Physician Assistants and Nurse Practitioners) who all work together to provide you with the care you need, when you need it.  Your next appointment:   12 month(s)  The format for your next appointment:   In Person  Provider:   You may see Larae Grooms, MD or one of the following Advanced Practice Providers on your designated Care Team:    Melina Copa, PA-C  Ermalinda Barrios, PA-C   Other Instructions

## 2019-09-23 ENCOUNTER — Telehealth: Payer: Self-pay | Admitting: *Deleted

## 2019-09-23 NOTE — Telephone Encounter (Signed)
Schedule AWV.  

## 2019-09-26 ENCOUNTER — Other Ambulatory Visit: Payer: Self-pay

## 2019-09-26 ENCOUNTER — Ambulatory Visit (HOSPITAL_COMMUNITY): Payer: PPO | Attending: Cardiology

## 2019-09-26 DIAGNOSIS — Z952 Presence of prosthetic heart valve: Secondary | ICD-10-CM

## 2019-10-21 ENCOUNTER — Ambulatory Visit: Payer: PPO | Attending: Internal Medicine

## 2019-10-21 DIAGNOSIS — Z23 Encounter for immunization: Secondary | ICD-10-CM | POA: Insufficient documentation

## 2019-10-21 NOTE — Progress Notes (Signed)
   Covid-19 Vaccination Clinic  Name:  James Mayo    MRN: [id number] DOB: 08/09/1951  10/21/2019  [name] was observed post Covid-19 immunization for 15 minutes without incidence. He was provided with Vaccine Information Sheet and instruction to access the V-Safe system.   [name] was instructed to call 911 with any severe reactions post vaccine: Marland Kitchen Difficulty breathing  . Swelling of your face and throat  . A fast heartbeat  . A bad rash all over your body  . Dizziness and weakness    Immunizations Administered    Name Date Dose VIS Date Route   Pfizer COVID-19 Vaccine 10/21/2019  5:39 PM 0.3 mL 08/23/2019 Intramuscular   Manufacturer: [place]   Lot: [id number]   [place]: [id number]

## 2019-10-30 ENCOUNTER — Telehealth: Payer: Self-pay | Admitting: Interventional Cardiology

## 2019-10-30 DIAGNOSIS — Z952 Presence of prosthetic heart valve: Secondary | ICD-10-CM

## 2019-10-30 MED ORDER — AMOXICILLIN 500 MG PO CAPS: ORAL_CAPSULE | ORAL | 3 refills | Status: DC

## 2019-10-30 MED FILL — AMOXICILLIN 500 MG CAPSULE: 500 | 1 days supply | Qty: 4 | Fill #0

## 2019-10-30 NOTE — Telephone Encounter (Signed)
Drue Novel I, RN  10/30/2019 4:26 PM EST    The patient has been notified of the result and verbalized understanding. Echo ordered for 1 year. Amoxicillin sent in for SBE prophylaxis. All questions (if any) were answered. Cleon Gustin, RN 10/30/2019 4:26 PM    Drue Novel I, RN  10/29/2019 1:36 PM EST    Left message for patient to call back.   Drue Novel I, RN  10/02/2019 12:05 PM EST    Left message for patient to call back.   Jettie Booze, MD  09/30/2019 11:19 PM EST    Mild increase in aortic valve gradient compared to prior- which is responsible or murmur. No need for intervention at this time and not causing symptoms. Would recheck echo in one year. COntinue SBE prophylaxis.

## 2019-10-30 NOTE — Telephone Encounter (Signed)
   Pt is returning call regarding his echo result.  Please call

## 2019-11-05 DIAGNOSIS — L570 Actinic keratosis: Secondary | ICD-10-CM | POA: Diagnosis not present

## 2019-11-05 DIAGNOSIS — Z23 Encounter for immunization: Secondary | ICD-10-CM | POA: Diagnosis not present

## 2019-11-05 DIAGNOSIS — D485 Neoplasm of uncertain behavior of skin: Secondary | ICD-10-CM | POA: Diagnosis not present

## 2019-11-05 DIAGNOSIS — C44319 Basal cell carcinoma of skin of other parts of face: Secondary | ICD-10-CM | POA: Diagnosis not present

## 2019-11-07 DIAGNOSIS — H903 Sensorineural hearing loss, bilateral: Secondary | ICD-10-CM | POA: Diagnosis not present

## 2019-11-12 ENCOUNTER — Other Ambulatory Visit: Payer: Self-pay | Admitting: Registered Nurse

## 2019-11-12 DIAGNOSIS — E039 Hypothyroidism, unspecified: Secondary | ICD-10-CM

## 2019-11-12 DIAGNOSIS — H903 Sensorineural hearing loss, bilateral: Secondary | ICD-10-CM | POA: Diagnosis not present

## 2019-11-12 NOTE — Telephone Encounter (Signed)
Notified pt to schedule med refill appt

## 2019-11-13 MED FILL — LEVOTHYROXINE 88 MCG TABLET: 88 | 30 days supply | Qty: 30 | Fill #0

## 2019-11-13 NOTE — Telephone Encounter (Signed)
Pt called regarding med refill needing appt. He made an appt for 3/4 but would like dr. to give him a call because he doesn't understand why he needs an appt. [phone]. Please advise

## 2019-11-14 ENCOUNTER — Ambulatory Visit: Payer: PPO | Admitting: Family Medicine

## 2019-11-15 ENCOUNTER — Ambulatory Visit: Payer: PPO | Attending: Internal Medicine

## 2019-11-15 DIAGNOSIS — Z23 Encounter for immunization: Secondary | ICD-10-CM | POA: Insufficient documentation

## 2019-11-15 NOTE — Progress Notes (Signed)
   Covid-19 Vaccination Clinic  Name:  James Mayo    MRN: [id number] DOB: 1951/06/07  11/15/2019  James Mayo was observed post Covid-19 immunization for 15 minutes without incident. He was provided with Vaccine Information Sheet and instruction to access the V-Safe system.   James Mayo was instructed to call 911 with any severe reactions post vaccine: Marland Kitchen Difficulty breathing  . Swelling of face and throat  . A fast heartbeat  . A bad rash all over body  . Dizziness and weakness   Immunizations Administered    Name Date Dose VIS Date Route   Pfizer COVID-19 Vaccine 11/15/2019  5:03 PM 0.3 mL 08/23/2019 Intramuscular   Manufacturer: [place]   Lot: [id number]   [place]: [id number]

## 2019-12-05 ENCOUNTER — Other Ambulatory Visit: Payer: Self-pay

## 2019-12-05 ENCOUNTER — Encounter: Payer: Self-pay | Admitting: Family Medicine

## 2019-12-05 ENCOUNTER — Ambulatory Visit (INDEPENDENT_AMBULATORY_CARE_PROVIDER_SITE_OTHER): Payer: PPO | Admitting: Family Medicine

## 2019-12-05 ENCOUNTER — Telehealth: Payer: Self-pay | Admitting: Family Medicine

## 2019-12-05 DIAGNOSIS — E039 Hypothyroidism, unspecified: Secondary | ICD-10-CM | POA: Diagnosis not present

## 2019-12-05 MED ORDER — LEVOTHYROXINE SODIUM 88 MCG PO TABS: 88.0000 ug | ORAL_TABLET | Freq: Every day | ORAL | 1 refills | Status: DC

## 2019-12-05 NOTE — Patient Instructions (Addendum)
No chnge in meds for now. If TSH is increasing. May need to adjust but will let you know.   If you have lab work done today you will be contacted with your lab results within the next 2 weeks.  If you have not heard from [place] then please contact us. The fastest way to get your results is to register for My Chart.   IF you received an x-ray today, you will receive an invoice from [facility]. Please contact [facility] at [phone] with questions or concerns regarding your invoice.   IF you received labwork today, you will receive an invoice from [place]. Please contact [facility] at [phone] with questions or concerns regarding your invoice.   Our billing staff will not be able to assist you with questions regarding bills from these companies.  You will be contacted with the lab results as soon as they are available. The fastest way to get your results is to activate your My Chart account. Instructions are located on the last page of this paperwork. If you have not heard from [place] regarding the results in 2 weeks, please contact this office.
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
## 2019-12-05 NOTE — Telephone Encounter (Signed)
12/05/2019 - PATIENT SAW DR. Carlota Raspberry ON 12/05/2019 FOR HIS MEDICATION REFILLS. DR. Carlota Raspberry HAS REQUESTED HE RETURN FOR FOLLOW-UP IN 6 MONTHS (SEPT. 2021). I TRIED TO CALL AND SCHEDULE BUT HAD TO LEAVE A VOICE MAIL FOR HIM TO RETURN MY CALL. [place]

## 2019-12-06 LAB — TSH+FREE T4
Free T4: 1.5 ng/dL (ref 0.82–1.77)
TSH: 2.15 u[IU]/mL (ref 0.450–4.500)

## 2019-12-06 MED FILL — LEVOTHYROXINE 88 MCG TABLET: 88 | 90 days supply | Qty: 90 | Fill #0

## 2019-12-09 DIAGNOSIS — C44319 Basal cell carcinoma of skin of other parts of face: Secondary | ICD-10-CM | POA: Diagnosis not present

## 2020-01-20 ENCOUNTER — Other Ambulatory Visit: Payer: Self-pay | Admitting: *Deleted

## 2020-01-20 DIAGNOSIS — I712 Thoracic aortic aneurysm, without rupture, unspecified: Secondary | ICD-10-CM

## 2020-01-20 NOTE — Progress Notes (Unsigned)
CTA

## 2020-03-05 ENCOUNTER — Other Ambulatory Visit: Payer: Self-pay

## 2020-03-05 ENCOUNTER — Encounter: Payer: Self-pay | Admitting: Cardiothoracic Surgery

## 2020-03-05 ENCOUNTER — Ambulatory Visit: Payer: PPO | Admitting: Cardiothoracic Surgery

## 2020-03-05 ENCOUNTER — Ambulatory Visit
Admission: RE | Admit: 2020-03-05 | Discharge: 2020-03-05 | Disposition: A | Discharge status: Home / Self Care | Payer: PPO | Source: Ambulatory Visit | Attending: Cardiothoracic Surgery | Admitting: Cardiothoracic Surgery

## 2020-03-05 VITALS — BP 146/88 | HR 52 | Temp 97.2°F | Resp 16 | Ht 66.0 in | Wt 135.0 lb

## 2020-03-05 DIAGNOSIS — I712 Thoracic aortic aneurysm, without rupture, unspecified: Secondary | ICD-10-CM

## 2020-03-05 DIAGNOSIS — Z95828 Presence of other vascular implants and grafts: Secondary | ICD-10-CM | POA: Diagnosis not present

## 2020-03-05 MED ORDER — IOPAMIDOL (ISOVUE-370) INJECTION 76%
75.0000 mL | Freq: Once | INTRAVENOUS | Status: AC | PRN
  Administered 2020-03-05: 75 mL via INTRAVENOUS

## 2020-03-05 NOTE — Patient Instructions (Signed)
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
## 2020-03-12 MED FILL — LEVOTHYROXINE 88 MCG TABLET: 88 | 90 days supply | Qty: 90 | Fill #1

## 2020-04-18 DIAGNOSIS — S721 Pertrochanteric fracture: Secondary | ICD-10-CM | POA: Diagnosis not present

## 2020-04-28 ENCOUNTER — Other Ambulatory Visit: Payer: Self-pay | Admitting: *Deleted

## 2020-04-28 DIAGNOSIS — E039 Hypothyroidism, unspecified: Secondary | ICD-10-CM

## 2020-04-28 DIAGNOSIS — S72002A Fracture of unspecified part of neck of left femur, initial encounter for closed fracture: Secondary | ICD-10-CM

## 2020-05-07 ENCOUNTER — Other Ambulatory Visit: Payer: Self-pay

## 2020-05-07 ENCOUNTER — Ambulatory Visit (INDEPENDENT_AMBULATORY_CARE_PROVIDER_SITE_OTHER): Payer: PPO | Admitting: Family Medicine

## 2020-05-07 VITALS — BP 136/84 | HR 57 | Ht 66.0 in | Wt 137.8 lb

## 2020-05-07 DIAGNOSIS — Z4802 Encounter for removal of sutures: Secondary | ICD-10-CM

## 2020-05-07 NOTE — Progress Notes (Signed)
    SUBJECTIVE:   CHIEF COMPLAINT / HPI:   Suture Removal Over two weeks ago patient fell in [place] while visiting his daughter and fractured his femoral neck Had IMN performed on left femur  Presents for suture removal on left leg No redness, pus, or fevers Has been doing well and planning to start PT Will be following up with Sports Medicine  PERTINENT  PMH / PSH: Recent femoral neck fx  OBJECTIVE:   BP 136/84   Pulse (!) 57   Ht 5\' 6"  (1.676 m)   Wt 137 lb 12.8 oz (62.5 kg)   SpO2 99%   BMI 22.24 kg/m    Physical Exam:  General: 69 y.o. male in NAD Lungs: Breathing comfortably on room air Skin: Diffuse ecchymosis of left lower extremity, well-healing lacerations with overlying sutures on left hip and left mid thigh, 13 in total, no overlying erythema or drainage noted  Suture removal: Area was cleansed in the usual fashion. Sutures were removed using forceps and scissors. Patient tolerated the procedure well. Wound edges remained well approximated after removal.   ASSESSMENT/PLAN:   Visit for suture removal Procedure note per above. Patient tolerated procedure well. Advised him to keep the area clean and dry for the next few days given small wounds where the sutures were. Also advised to use a bandage if cannot keep the area clean. Return if fever, redness, discharge. Discussed with Dr. Andria Frames, no charge appropriate for this visit.     Cleophas Dunker, [place]

## 2020-05-07 NOTE — Assessment & Plan Note (Signed)
Procedure note per above. Patient tolerated procedure well. Advised him to keep the area clean and dry for the next few days given small wounds where the sutures were. Also advised to use a bandage if cannot keep the area clean. Return if fever, redness, discharge. Discussed with Dr. Andria Frames, no charge appropriate for this visit.

## 2020-05-14 ENCOUNTER — Encounter: Payer: Self-pay | Admitting: Physical Therapy

## 2020-05-14 ENCOUNTER — Other Ambulatory Visit: Payer: Self-pay

## 2020-05-14 ENCOUNTER — Inpatient Hospital Stay: Payer: PPO | Admitting: Family Medicine

## 2020-05-14 ENCOUNTER — Ambulatory Visit: Payer: PPO | Attending: Sports Medicine | Admitting: Physical Therapy

## 2020-05-14 DIAGNOSIS — M79601 Pain in right arm: Secondary | ICD-10-CM | POA: Diagnosis not present

## 2020-05-14 DIAGNOSIS — M6281 Muscle weakness (generalized): Secondary | ICD-10-CM | POA: Diagnosis not present

## 2020-05-14 DIAGNOSIS — S72002S Fracture of unspecified part of neck of left femur, sequela: Secondary | ICD-10-CM | POA: Insufficient documentation

## 2020-05-14 DIAGNOSIS — M25511 Pain in right shoulder: Secondary | ICD-10-CM | POA: Insufficient documentation

## 2020-05-14 DIAGNOSIS — R293 Abnormal posture: Secondary | ICD-10-CM | POA: Insufficient documentation

## 2020-05-14 DIAGNOSIS — M25552 Pain in left hip: Secondary | ICD-10-CM | POA: Diagnosis not present
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
## 2020-05-14 NOTE — Patient Instructions (Signed)
Access Code: [email or web address]: [email or web address]: 09/02/2021Prepared by: Anderson Malta PaaExercises  Supine Bridge - 2 x daily - 7 x weekly - 2-3 sets - 10 reps - 5 hold  Active Straight Leg Raise with Quad Set - 2 x daily - 7 x weekly - 2-3 sets - 10 reps - 5 hold  Sidelying Hip Abduction - 2 x daily - 7 x weekly - 2-3 sets - 10 reps - 5 hold  Clamshell - 2 x daily - 7 x weekly - 2-3 sets - 10 reps  Prone Hip Extension with Plantarflexion - 2 x daily - 7 x weekly - 2-3 sets - 10 reps - 5 hold

## 2020-05-19 ENCOUNTER — Ambulatory Visit: Payer: PPO | Admitting: Physical Therapy

## 2020-05-19 ENCOUNTER — Other Ambulatory Visit: Payer: Self-pay

## 2020-05-19 ENCOUNTER — Encounter: Payer: Self-pay | Admitting: Physical Therapy

## 2020-05-19 DIAGNOSIS — R293 Abnormal posture: Secondary | ICD-10-CM

## 2020-05-19 DIAGNOSIS — M6281 Muscle weakness (generalized): Secondary | ICD-10-CM

## 2020-05-19 DIAGNOSIS — M25552 Pain in left hip: Secondary | ICD-10-CM

## 2020-05-19 DIAGNOSIS — S72002S Fracture of unspecified part of neck of left femur, sequela: Secondary | ICD-10-CM
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
## 2020-05-22 ENCOUNTER — Inpatient Hospital Stay: Payer: PPO | Admitting: Family Medicine

## 2020-05-26 ENCOUNTER — Ambulatory Visit: Payer: PPO | Admitting: Sports Medicine

## 2020-05-26 ENCOUNTER — Encounter: Payer: Self-pay | Admitting: Sports Medicine

## 2020-05-26 ENCOUNTER — Other Ambulatory Visit: Payer: Self-pay

## 2020-05-26 VITALS — BP 120/78 | Ht 67.5 in | Wt 135.0 lb

## 2020-05-26 DIAGNOSIS — D6489 Other specified anemias: Secondary | ICD-10-CM

## 2020-05-26 DIAGNOSIS — S72142A Displaced intertrochanteric fracture of left femur, initial encounter for closed fracture: Secondary | ICD-10-CM | POA: Insufficient documentation

## 2020-05-26 DIAGNOSIS — S72142D Displaced intertrochanteric fracture of left femur, subsequent encounter for closed fracture with routine healing: Secondary | ICD-10-CM | POA: Diagnosis not present
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
## 2020-05-26 NOTE — Assessment & Plan Note (Signed)
I think he is making excellent progress His walking progress is limited because of the leg length inequality that he has acquired I will bring him back in 3 to 4 weeks to make him a custom orthotic once he tests the leg lift that we are trying today He should use a highly cushioned shoe  Continue with physical therapy exercises Okay to do aquatics and some stationary biking Gradually increase his walking

## 2020-05-27 DIAGNOSIS — D6489 Other specified anemias: Secondary | ICD-10-CM | POA: Diagnosis not present

## 2020-05-28 ENCOUNTER — Other Ambulatory Visit: Payer: Self-pay

## 2020-05-28 ENCOUNTER — Ambulatory Visit: Payer: PPO

## 2020-05-28 DIAGNOSIS — M79601 Pain in right arm: Secondary | ICD-10-CM

## 2020-05-28 DIAGNOSIS — M25511 Pain in right shoulder: Secondary | ICD-10-CM

## 2020-05-28 DIAGNOSIS — R293 Abnormal posture: Secondary | ICD-10-CM | POA: Diagnosis not present

## 2020-05-28 DIAGNOSIS — S72002S Fracture of unspecified part of neck of left femur, sequela: Secondary | ICD-10-CM

## 2020-05-28 DIAGNOSIS — M6281 Muscle weakness (generalized): Secondary | ICD-10-CM

## 2020-05-28 DIAGNOSIS — M25552 Pain in left hip: Secondary | ICD-10-CM

## 2020-05-28 LAB — CBC
Hematocrit: 40.7 % (ref 37.5–51.0)
Hemoglobin: 13.9 g/dL (ref 13.0–17.7)
MCH: 34.7 pg — ABNORMAL HIGH (ref 26.6–33.0)
MCHC: 34.2 g/dL (ref 31.5–35.7)
MCV: 102 fL — ABNORMAL HIGH (ref 79–97)
Platelets: 218 10*3/uL (ref 150–450)
RBC: 4.01 x10E6/uL — ABNORMAL LOW (ref 4.14–5.80)
RDW: 13 % (ref 11.6–15.4)
WBC: 5.9 10*3/uL (ref 3.4–10.8)

## 2020-05-28 LAB — FERRITIN: Ferritin: 168 ng/mL (ref 30–400)

## 2020-05-28 NOTE — Patient Instructions (Signed)
Lateral step ups; Hip add sets c ball
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
## 2020-06-02 ENCOUNTER — Other Ambulatory Visit: Payer: Self-pay

## 2020-06-02 ENCOUNTER — Encounter: Payer: Self-pay | Admitting: Physical Therapy

## 2020-06-02 ENCOUNTER — Ambulatory Visit: Payer: PPO | Admitting: Physical Therapy

## 2020-06-02 DIAGNOSIS — M25552 Pain in left hip: Secondary | ICD-10-CM

## 2020-06-02 DIAGNOSIS — M6281 Muscle weakness (generalized): Secondary | ICD-10-CM

## 2020-06-02 DIAGNOSIS — R293 Abnormal posture: Secondary | ICD-10-CM | POA: Diagnosis not present

## 2020-06-02 DIAGNOSIS — S72002S Fracture of unspecified part of neck of left femur, sequela: Secondary | ICD-10-CM
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
## 2020-06-04 ENCOUNTER — Other Ambulatory Visit: Payer: Self-pay

## 2020-06-04 ENCOUNTER — Encounter: Payer: Self-pay | Admitting: Physical Therapy

## 2020-06-04 ENCOUNTER — Ambulatory Visit: Payer: PPO | Admitting: Physical Therapy

## 2020-06-04 DIAGNOSIS — R293 Abnormal posture: Secondary | ICD-10-CM | POA: Diagnosis not present

## 2020-06-04 DIAGNOSIS — S72002S Fracture of unspecified part of neck of left femur, sequela: Secondary | ICD-10-CM

## 2020-06-04 DIAGNOSIS — M6281 Muscle weakness (generalized): Secondary | ICD-10-CM

## 2020-06-04 DIAGNOSIS — M25552 Pain in left hip: Secondary | ICD-10-CM
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
## 2020-06-08 ENCOUNTER — Ambulatory Visit: Payer: PPO | Admitting: Physical Therapy

## 2020-06-08 ENCOUNTER — Encounter: Payer: Self-pay | Admitting: Physical Therapy

## 2020-06-08 ENCOUNTER — Other Ambulatory Visit: Payer: Self-pay

## 2020-06-08 DIAGNOSIS — R293 Abnormal posture: Secondary | ICD-10-CM | POA: Diagnosis not present

## 2020-06-08 DIAGNOSIS — S72002S Fracture of unspecified part of neck of left femur, sequela: Secondary | ICD-10-CM

## 2020-06-08 DIAGNOSIS — M6281 Muscle weakness (generalized): Secondary | ICD-10-CM
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
## 2020-06-10 ENCOUNTER — Ambulatory Visit: Payer: PPO | Admitting: Physical Therapy

## 2020-06-10 ENCOUNTER — Encounter: Payer: Self-pay | Admitting: Physical Therapy

## 2020-06-10 ENCOUNTER — Other Ambulatory Visit: Payer: Self-pay

## 2020-06-10 DIAGNOSIS — S72002S Fracture of unspecified part of neck of left femur, sequela: Secondary | ICD-10-CM

## 2020-06-10 DIAGNOSIS — M25552 Pain in left hip: Secondary | ICD-10-CM

## 2020-06-10 DIAGNOSIS — M6281 Muscle weakness (generalized): Secondary | ICD-10-CM

## 2020-06-10 DIAGNOSIS — R293 Abnormal posture: Secondary | ICD-10-CM
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
## 2020-06-15 ENCOUNTER — Other Ambulatory Visit: Payer: Self-pay

## 2020-06-15 ENCOUNTER — Ambulatory Visit: Payer: PPO | Attending: Sports Medicine | Admitting: Physical Therapy

## 2020-06-15 ENCOUNTER — Encounter: Payer: Self-pay | Admitting: Physical Therapy

## 2020-06-15 ENCOUNTER — Encounter: Payer: PPO | Admitting: Physical Therapy

## 2020-06-15 DIAGNOSIS — M25552 Pain in left hip: Secondary | ICD-10-CM

## 2020-06-15 DIAGNOSIS — S72002S Fracture of unspecified part of neck of left femur, sequela: Secondary | ICD-10-CM

## 2020-06-15 DIAGNOSIS — R293 Abnormal posture: Secondary | ICD-10-CM

## 2020-06-15 DIAGNOSIS — M6281 Muscle weakness (generalized): Secondary | ICD-10-CM
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
## 2020-06-16 ENCOUNTER — Ambulatory Visit (INDEPENDENT_AMBULATORY_CARE_PROVIDER_SITE_OTHER): Payer: PPO | Admitting: Sports Medicine

## 2020-06-16 ENCOUNTER — Other Ambulatory Visit: Payer: Self-pay | Admitting: Family Medicine

## 2020-06-16 ENCOUNTER — Encounter: Payer: PPO | Admitting: Physical Therapy

## 2020-06-16 ENCOUNTER — Other Ambulatory Visit: Payer: Self-pay | Admitting: Registered Nurse

## 2020-06-16 DIAGNOSIS — E039 Hypothyroidism, unspecified: Secondary | ICD-10-CM

## 2020-06-16 DIAGNOSIS — M25552 Pain in left hip: Secondary | ICD-10-CM | POA: Diagnosis not present

## 2020-06-16 MED FILL — LEVOTHYROXINE 88 MCG TABLET: 88 | 90 days supply | Qty: 90 | Fill #0

## 2020-06-16 NOTE — Telephone Encounter (Signed)
Requested medication (s) are due for refill today: yes  Requested medication (s) are on the active medication list: yes  Last refill:  03/12/20  Future visit scheduled: no  Notes to clinic:  last TSH 12/05/19    Requested Prescriptions  Pending Prescriptions Disp Refills   levothyroxine (SYNTHROID) 88 MCG tablet [Pharmacy Med Name: LEVOTHYROXINE 88 MCG TABLET 88 Tablet] 90 tablet 1    Sig: Take 1 tablet (88 mcg total) by mouth daily before breakfast.      Endocrinology:  Hypothyroid Agents Failed - 06/16/2020  8:28 AM      Failed - TSH needs to be rechecked within 3 months after an abnormal result. Refill until TSH is due.      Passed - TSH in normal range and within 360 days    TSH  Date Value Ref Range Status  12/05/2019 2.150 0.450 - 4.500 uIU/mL Final          Passed - Valid encounter within last 12 months    Recent Outpatient Visits           6 months ago Hypothyroidism, unspecified type   [facility], Ranell Patrick, MD   10 months ago Encounter for general adult medical examination with abnormal findings   [facility], Delfino Lovett, NP   1 year ago Hypothyroidism, unspecified type   [facility], Ranell Patrick, MD   1 year ago Hypothyroidism, unspecified type   [facility], Ranell Patrick, MD   2 years ago Hypothyroidism, unspecified type   [facility], Ranell Patrick, MD       Future Appointments             In 3 months [facility] Lack, Charlann Lange, MD [place]

## 2020-06-16 NOTE — Assessment & Plan Note (Signed)
Most likely referred pain from intratrochanteric femoral rod placement.  - Cont with rehab but advised he avoid dry needling - As discussed at his last appointment he does have a new leg length discrepancy after the surgery. - Fitted with orthotics as noted below

## 2020-06-16 NOTE — Progress Notes (Signed)
    SUBJECTIVE:   CHIEF COMPLAINT / HPI: F/U trochanteric hip fracture and acquired leg length inequality  Left Hip Pain Overall, patient feels as if he is improving and has increase strength and range of motion.  He has no pain on internal or external rotation of the hip.  He also has no difficulty or pain flexing his hip to his chest and then crossing over the midline.  He does state he has pain that radiates to his groin and it does make him somewhat hesitant to walk however he has been walking and doing well despite the pain in the groin region.  He is unsure of what brings about the pain but activity does not seem to make a big difference.  Now swimming 45 minutes daily Able to push off now with both legs  PERTINENT  PMH / PSH: History of left femur fracture status post intratrochanteric femoral rod placement.  Proximal atrial fibrillation  OBJECTIVE:   BP 134/84   Ht 5' 7.5" (1.715 m)   Wt 135 lb (61.2 kg)   BMI 20.83 kg/m   [facility] Adult [facility] 06/16/2020  Frequency of aerobic exercise (# of days/week) 7  Average time in minutes 70  Frequency of strengthening activities (# of days/week) 3   Hip, Left: No obvious rash, erythema, ecchymosis, or edema. ROM full in all directions; Strength 5/5 in IR/ER/Flex/Ext/Abd/Add. Normal gait.   ASSESSMENT/PLAN:   Left hip pain Most likely referred pain from intratrochanteric femoral rod placement.  - Cont with rehab but advised he avoid dry needling - As discussed at his last appointment he does have a new leg length discrepancy after the surgery. - Fitted with orthotics as noted below    Patient was fitted for a : standard, cushioned, semi-rigid orthotic. The orthotic was heated and afterward the patient stood on the orthotic blank positioned on the orthotic stand. The patient was positioned in subtalar neutral position and 10 degrees of ankle dorsiflexion in a weight bearing stance. After completion of molding, a  stable base was applied to the orthotic blank. The blank was ground to a stable position for weight bearing. Size: 9  Fast tech support black Base: [employer] med. density Additional Posting and Padding: None; But we added a commercial lift to the top of his left orthotic The patient ambulated these, and they were very comfortable.  I spent 40 minutes with this patient, greater than 50% was face-to-face time counseling regarding the diagnosis, treatment options, pathophysiology and prognosis of the diagnoses listed below.  Gait is improved with minimal trendelenburg to the left He is still cautious but walking with no pain  Nuala Alpha, DO PGY-4, Sports Medicine Fellow [place]  I observed and examined the patient with the [facility] resident and agree with assessment and plan.  Note reviewed and modified by me. Ila Mcgill, MD

## 2020-06-16 NOTE — Patient Instructions (Signed)
Patient declined  

## 2020-06-17 ENCOUNTER — Ambulatory Visit: Payer: PPO | Admitting: Physical Therapy

## 2020-06-18 ENCOUNTER — Telehealth: Payer: Self-pay | Admitting: *Deleted

## 2020-06-18 ENCOUNTER — Ambulatory Visit: Payer: PPO | Admitting: Physical Therapy

## 2020-06-18 ENCOUNTER — Other Ambulatory Visit: Payer: Self-pay

## 2020-06-18 ENCOUNTER — Encounter: Payer: Self-pay | Admitting: Physical Therapy

## 2020-06-18 DIAGNOSIS — R293 Abnormal posture: Secondary | ICD-10-CM | POA: Diagnosis not present

## 2020-06-18 DIAGNOSIS — M25552 Pain in left hip: Secondary | ICD-10-CM

## 2020-06-18 DIAGNOSIS — S72002S Fracture of unspecified part of neck of left femur, sequela: Secondary | ICD-10-CM

## 2020-06-18 DIAGNOSIS — M6281 Muscle weakness (generalized): Secondary | ICD-10-CM
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
## 2020-06-18 NOTE — Telephone Encounter (Signed)
Schedule AWV.  

## 2020-06-22 ENCOUNTER — Ambulatory Visit: Payer: PPO | Admitting: Physical Therapy

## 2020-06-22 ENCOUNTER — Encounter: Payer: Self-pay | Admitting: Physical Therapy

## 2020-06-22 ENCOUNTER — Other Ambulatory Visit: Payer: Self-pay

## 2020-06-22 DIAGNOSIS — M25552 Pain in left hip: Secondary | ICD-10-CM

## 2020-06-22 DIAGNOSIS — R293 Abnormal posture: Secondary | ICD-10-CM | POA: Diagnosis not present

## 2020-06-22 DIAGNOSIS — S72002S Fracture of unspecified part of neck of left femur, sequela: Secondary | ICD-10-CM

## 2020-06-22 DIAGNOSIS — M6281 Muscle weakness (generalized): Secondary | ICD-10-CM
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
## 2020-06-24 ENCOUNTER — Other Ambulatory Visit: Payer: Self-pay

## 2020-06-24 ENCOUNTER — Ambulatory Visit: Payer: PPO | Admitting: Physical Therapy

## 2020-06-24 ENCOUNTER — Encounter: Payer: Self-pay | Admitting: Physical Therapy

## 2020-06-24 DIAGNOSIS — M25552 Pain in left hip: Secondary | ICD-10-CM

## 2020-06-24 DIAGNOSIS — S72002S Fracture of unspecified part of neck of left femur, sequela: Secondary | ICD-10-CM

## 2020-06-24 DIAGNOSIS — R293 Abnormal posture: Secondary | ICD-10-CM

## 2020-06-24 DIAGNOSIS — M6281 Muscle weakness (generalized): Secondary | ICD-10-CM
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
## 2020-06-29 ENCOUNTER — Encounter: Payer: PPO | Admitting: Physical Therapy

## 2020-06-29 ENCOUNTER — Encounter: Payer: Self-pay | Admitting: Physical Therapy

## 2020-06-29 ENCOUNTER — Other Ambulatory Visit: Payer: Self-pay

## 2020-06-29 ENCOUNTER — Ambulatory Visit: Payer: PPO | Admitting: Physical Therapy

## 2020-06-29 DIAGNOSIS — R293 Abnormal posture: Secondary | ICD-10-CM

## 2020-06-29 DIAGNOSIS — M6281 Muscle weakness (generalized): Secondary | ICD-10-CM

## 2020-06-29 DIAGNOSIS — M25552 Pain in left hip: Secondary | ICD-10-CM

## 2020-06-29 DIAGNOSIS — S72002S Fracture of unspecified part of neck of left femur, sequela: Secondary | ICD-10-CM
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
## 2020-07-01 ENCOUNTER — Other Ambulatory Visit: Payer: Self-pay

## 2020-07-01 ENCOUNTER — Ambulatory Visit: Payer: PPO | Admitting: Physical Therapy

## 2020-07-01 DIAGNOSIS — M25552 Pain in left hip: Secondary | ICD-10-CM

## 2020-07-01 DIAGNOSIS — M6281 Muscle weakness (generalized): Secondary | ICD-10-CM

## 2020-07-01 DIAGNOSIS — R293 Abnormal posture: Secondary | ICD-10-CM

## 2020-07-01 DIAGNOSIS — S72002S Fracture of unspecified part of neck of left femur, sequela: Secondary | ICD-10-CM
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
## 2020-07-03 ENCOUNTER — Ambulatory Visit: Payer: PPO | Admitting: Physical Therapy

## 2020-07-06 ENCOUNTER — Encounter: Payer: PPO | Admitting: Physical Therapy

## 2020-07-08 ENCOUNTER — Encounter: Payer: Self-pay | Admitting: Physical Therapy

## 2020-07-08 ENCOUNTER — Other Ambulatory Visit: Payer: Self-pay

## 2020-07-08 ENCOUNTER — Ambulatory Visit: Payer: PPO | Admitting: Physical Therapy

## 2020-07-08 DIAGNOSIS — S72002S Fracture of unspecified part of neck of left femur, sequela: Secondary | ICD-10-CM

## 2020-07-08 DIAGNOSIS — M25552 Pain in left hip: Secondary | ICD-10-CM

## 2020-07-08 DIAGNOSIS — R293 Abnormal posture: Secondary | ICD-10-CM

## 2020-07-08 DIAGNOSIS — M6281 Muscle weakness (generalized): Secondary | ICD-10-CM
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
## 2020-07-14 ENCOUNTER — Encounter: Payer: Self-pay | Admitting: Physical Therapy

## 2020-07-14 ENCOUNTER — Other Ambulatory Visit: Payer: Self-pay

## 2020-07-14 ENCOUNTER — Ambulatory Visit: Payer: PPO | Attending: Sports Medicine | Admitting: Physical Therapy

## 2020-07-14 DIAGNOSIS — M25552 Pain in left hip: Secondary | ICD-10-CM

## 2020-07-14 DIAGNOSIS — S72002S Fracture of unspecified part of neck of left femur, sequela: Secondary | ICD-10-CM

## 2020-07-14 DIAGNOSIS — M6281 Muscle weakness (generalized): Secondary | ICD-10-CM

## 2020-07-14 DIAGNOSIS — R293 Abnormal posture: Secondary | ICD-10-CM | POA: Diagnosis not present
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
## 2020-07-21 ENCOUNTER — Encounter: Payer: Self-pay | Admitting: Physical Therapy

## 2020-07-21 ENCOUNTER — Other Ambulatory Visit: Payer: Self-pay

## 2020-07-21 ENCOUNTER — Ambulatory Visit: Payer: PPO | Admitting: Physical Therapy

## 2020-07-21 DIAGNOSIS — M25552 Pain in left hip: Secondary | ICD-10-CM

## 2020-07-21 DIAGNOSIS — S72002S Fracture of unspecified part of neck of left femur, sequela: Secondary | ICD-10-CM

## 2020-07-21 DIAGNOSIS — R293 Abnormal posture: Secondary | ICD-10-CM

## 2020-07-21 DIAGNOSIS — M6281 Muscle weakness (generalized): Secondary | ICD-10-CM
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
## 2020-07-23 ENCOUNTER — Ambulatory Visit: Payer: PPO | Admitting: Physical Therapy

## 2020-07-24 ENCOUNTER — Ambulatory Visit: Payer: PPO | Admitting: Physical Therapy

## 2020-07-24 ENCOUNTER — Other Ambulatory Visit: Payer: Self-pay

## 2020-07-24 ENCOUNTER — Encounter: Payer: Self-pay | Admitting: Physical Therapy

## 2020-07-24 DIAGNOSIS — M25552 Pain in left hip: Secondary | ICD-10-CM

## 2020-07-24 DIAGNOSIS — M6281 Muscle weakness (generalized): Secondary | ICD-10-CM

## 2020-07-24 DIAGNOSIS — S72002S Fracture of unspecified part of neck of left femur, sequela: Secondary | ICD-10-CM

## 2020-07-24 DIAGNOSIS — R293 Abnormal posture: Secondary | ICD-10-CM
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
## 2020-07-28 ENCOUNTER — Ambulatory Visit: Payer: PPO | Admitting: Sports Medicine

## 2020-07-28 ENCOUNTER — Ambulatory Visit: Payer: PPO | Admitting: Physical Therapy

## 2020-07-28 VITALS — BP 161/97 | Ht 67.5 in | Wt 135.0 lb

## 2020-07-28 DIAGNOSIS — S72142D Displaced intertrochanteric fracture of left femur, subsequent encounter for closed fracture with routine healing: Secondary | ICD-10-CM

## 2020-07-28 NOTE — Patient Instructions (Signed)
It was great to see you today! Thank you for letting me participate in your care!  Today, we discussed your continued improvement. Please continue biking increasing to work up to the time you were on the bike before your surgery, once you can reach the time goal please increase your intensity as pain allows.   You need to work on your hip abductor strength as well. Please do the exercises we showed you and add in ankle weights. You can also continue to do these at physical therapy.   Be well, James Rutherford, DO PGY-4, Sports Medicine Fellow [place]

## 2020-07-28 NOTE — Progress Notes (Signed)
    SUBJECTIVE:   CHIEF COMPLAINT / HPI:   Follow-up for her Left hip fracture status post femoral nail Mr. Paxton is a pleasant 69 year old male who is coming in for follow-up todayFor a Left trochanteric hip fracture status post femoral nail that resulted in a leg length inequality.  Overall he is feeling much better and making great progress with physical therapy.  He states he feels like he is about 80% back to where he was before however PT feels as if his strength is around 65%.  He has been biking in doors and stated that that thisd has gone well and did not have much groin pain except for one day which he overdid it in the groin pain returned.  He has been swimming again without any pain or problems.  Overall he is happy with his progress.  PERTINENT  PMH / PSH: PAF  OBJECTIVE:   BP (!) 161/97   Ht 5' 7.5" (1.715 m)   Wt 135 lb (61.2 kg)   BMI 20.83 kg/m   [place] Adult Exercise 06/16/2020 07/28/2020  Frequency of aerobic exercise (# of days/week) 7 7  Average time in minutes 70 70  Frequency of strengthening activities (# of days/week) 3 3   Hip, Left: No obvious rash, erythema, ecchymosis, or edema. ROM full in all directions; Strength 5/5 in IR/ER/Flex/Ext but weak in Abduction and adduction. Pelvic alignment unremarkable to inspection and palpation. Standing hip rotation and gait without trendelenburg / unsteadiness. Greater trochanter without tenderness to palpation. Gait: Continue to have internal rotation of the left foot and Hip.   ASSESSMENT/PLAN:   Intertrochanteric fracture of left hip (HCC) Continues to make good progress with use of orthotics and the heel lift. Continue with physical therapy, aquatics, stationary biking.  Continue to gradually increase walking.  Patient given exercises to increase hip abductor strength.  Follow-up in 6-8 weeks.   He can probably progress from formal PT to a HEP as he is highly motivated.  I suggested discussing with  PT and coming up with a strategy to transition.  Nuala Alpha, DO PGY-4, Sports Medicine Fellow [place]  I observed and examined the patient with the [facility] resident and agree with assessment and plan.  Note reviewed and modified by me. Ila Mcgill, MD

## 2020-07-28 NOTE — Assessment & Plan Note (Signed)
Continues to make good progress with use of orthotics and the heel lift. Continue with physical therapy, aquatics, stationary biking.  Continue to gradually increase walking.  Patient given exercises to increase hip abductor strength.  Follow-up in 6-8 weeks.

## 2020-07-30 ENCOUNTER — Ambulatory Visit: Payer: PPO | Admitting: Physical Therapy

## 2020-07-30 ENCOUNTER — Other Ambulatory Visit: Payer: Self-pay

## 2020-07-30 ENCOUNTER — Encounter: Payer: Self-pay | Admitting: Physical Therapy

## 2020-07-30 DIAGNOSIS — R293 Abnormal posture: Secondary | ICD-10-CM

## 2020-07-30 DIAGNOSIS — S72002S Fracture of unspecified part of neck of left femur, sequela: Secondary | ICD-10-CM

## 2020-07-30 DIAGNOSIS — M25552 Pain in left hip: Secondary | ICD-10-CM

## 2020-07-30 DIAGNOSIS — M6281 Muscle weakness (generalized): Secondary | ICD-10-CM
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
## 2020-08-04 ENCOUNTER — Encounter: Payer: Self-pay | Admitting: Physical Therapy

## 2020-08-04 ENCOUNTER — Ambulatory Visit: Payer: PPO | Admitting: Physical Therapy

## 2020-08-04 ENCOUNTER — Other Ambulatory Visit: Payer: Self-pay

## 2020-08-04 DIAGNOSIS — M25552 Pain in left hip: Secondary | ICD-10-CM

## 2020-08-04 DIAGNOSIS — R293 Abnormal posture: Secondary | ICD-10-CM | POA: Diagnosis not present

## 2020-08-04 DIAGNOSIS — S72002S Fracture of unspecified part of neck of left femur, sequela: Secondary | ICD-10-CM

## 2020-08-04 DIAGNOSIS — M6281 Muscle weakness (generalized): Secondary | ICD-10-CM
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
## 2020-08-05 ENCOUNTER — Ambulatory Visit: Payer: PPO | Admitting: Physical Therapy

## 2020-08-11 DIAGNOSIS — Z6821 Body mass index (BMI) 21.0-21.9, adult: Secondary | ICD-10-CM | POA: Diagnosis not present

## 2020-08-11 DIAGNOSIS — I10 Essential (primary) hypertension: Secondary | ICD-10-CM | POA: Diagnosis not present

## 2020-08-11 DIAGNOSIS — Z952 Presence of prosthetic heart valve: Secondary | ICD-10-CM | POA: Diagnosis not present

## 2020-08-11 DIAGNOSIS — E039 Hypothyroidism, unspecified: Secondary | ICD-10-CM | POA: Diagnosis not present

## 2020-08-19 ENCOUNTER — Ambulatory Visit: Payer: PPO | Attending: Sports Medicine | Admitting: Physical Therapy

## 2020-08-19 ENCOUNTER — Encounter: Payer: Self-pay | Admitting: Physical Therapy

## 2020-08-19 ENCOUNTER — Telehealth: Payer: Self-pay | Admitting: Physical Therapy

## 2020-08-19 DIAGNOSIS — S72002S Fracture of unspecified part of neck of left femur, sequela: Secondary | ICD-10-CM | POA: Insufficient documentation

## 2020-08-19 DIAGNOSIS — M6281 Muscle weakness (generalized): Secondary | ICD-10-CM | POA: Insufficient documentation

## 2020-08-19 DIAGNOSIS — R293 Abnormal posture: Secondary | ICD-10-CM | POA: Insufficient documentation

## 2020-08-19 DIAGNOSIS — M25552 Pain in left hip: Secondary | ICD-10-CM | POA: Insufficient documentation

## 2020-08-19 NOTE — Telephone Encounter (Signed)
Patient was a no show for his appt this AM at 9:15, called to inquire and left message on voicemail regarding his next appt. 08/26/20.  Asked him to call if needed to reschedule today's appt.   Raeford Razor, PT 08/19/20 9:49 AM Phone: [phone] Fax: [phone]

## 2020-08-24 ENCOUNTER — Encounter: Payer: PPO | Admitting: Physical Therapy

## 2020-08-26 ENCOUNTER — Ambulatory Visit: Payer: PPO | Admitting: Physical Therapy

## 2020-08-26 ENCOUNTER — Encounter: Payer: Self-pay | Admitting: Physical Therapy

## 2020-08-26 ENCOUNTER — Other Ambulatory Visit: Payer: Self-pay

## 2020-08-26 DIAGNOSIS — S72002S Fracture of unspecified part of neck of left femur, sequela: Secondary | ICD-10-CM | POA: Diagnosis not present

## 2020-08-26 DIAGNOSIS — M25552 Pain in left hip: Secondary | ICD-10-CM

## 2020-08-26 DIAGNOSIS — R293 Abnormal posture: Secondary | ICD-10-CM | POA: Diagnosis not present

## 2020-08-26 DIAGNOSIS — M6281 Muscle weakness (generalized): Secondary | ICD-10-CM | POA: Diagnosis not present
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
## 2020-09-14 ENCOUNTER — Other Ambulatory Visit (HOSPITAL_COMMUNITY): Payer: Self-pay | Admitting: Family Medicine

## 2020-09-14 DIAGNOSIS — R350 Frequency of micturition: Secondary | ICD-10-CM | POA: Diagnosis not present

## 2020-09-14 MED FILL — SULFAMETHOXAZOLE-TMP DS TAB: 800-160 | 7 days supply | Qty: 14 | Fill #0

## 2020-09-15 ENCOUNTER — Other Ambulatory Visit (HOSPITAL_COMMUNITY): Payer: Self-pay | Admitting: Family Medicine

## 2020-09-15 MED FILL — LEVOTHYROXINE 88 MCG TABLET: 88 | 90 days supply | Qty: 90 | Fill #0

## 2020-09-17 ENCOUNTER — Ambulatory Visit: Payer: PPO | Admitting: Physical Therapy

## 2020-09-22 DIAGNOSIS — Z85828 Personal history of other malignant neoplasm of skin: Secondary | ICD-10-CM | POA: Diagnosis not present

## 2020-09-22 DIAGNOSIS — L578 Other skin changes due to chronic exposure to nonionizing radiation: Secondary | ICD-10-CM | POA: Diagnosis not present

## 2020-09-22 DIAGNOSIS — D485 Neoplasm of uncertain behavior of skin: Secondary | ICD-10-CM | POA: Diagnosis not present

## 2020-09-22 DIAGNOSIS — L570 Actinic keratosis: Secondary | ICD-10-CM | POA: Diagnosis not present

## 2020-09-22 DIAGNOSIS — C44319 Basal cell carcinoma of skin of other parts of face: Secondary | ICD-10-CM | POA: Diagnosis not present

## 2020-09-22 DIAGNOSIS — D225 Melanocytic nevi of trunk: Secondary | ICD-10-CM | POA: Diagnosis not present

## 2020-09-22 DIAGNOSIS — L814 Other melanin hyperpigmentation: Secondary | ICD-10-CM | POA: Diagnosis not present

## 2020-09-22 DIAGNOSIS — L821 Other seborrheic keratosis: Secondary | ICD-10-CM | POA: Diagnosis not present

## 2020-09-29 DIAGNOSIS — E039 Hypothyroidism, unspecified: Secondary | ICD-10-CM | POA: Diagnosis not present

## 2020-09-29 DIAGNOSIS — Z125 Encounter for screening for malignant neoplasm of prostate: Secondary | ICD-10-CM | POA: Diagnosis not present

## 2020-09-29 DIAGNOSIS — I2510 Atherosclerotic heart disease of native coronary artery without angina pectoris: Secondary | ICD-10-CM | POA: Diagnosis not present

## 2020-09-29 DIAGNOSIS — M8580 Other specified disorders of bone density and structure, unspecified site: Secondary | ICD-10-CM | POA: Diagnosis not present

## 2020-09-29 DIAGNOSIS — R350 Frequency of micturition: Secondary | ICD-10-CM | POA: Diagnosis not present

## 2020-09-29 DIAGNOSIS — D7589 Other specified diseases of blood and blood-forming organs: Secondary | ICD-10-CM | POA: Diagnosis not present

## 2020-09-29 DIAGNOSIS — I700 Atherosclerosis of aorta: Secondary | ICD-10-CM | POA: Diagnosis not present

## 2020-09-29 DIAGNOSIS — Z0000 Encounter for general adult medical examination without abnormal findings: Secondary | ICD-10-CM | POA: Diagnosis not present

## 2020-09-29 DIAGNOSIS — Z6821 Body mass index (BMI) 21.0-21.9, adult: Secondary | ICD-10-CM | POA: Diagnosis not present

## 2020-10-01 ENCOUNTER — Encounter: Payer: Self-pay | Admitting: Physical Therapy

## 2020-10-01 ENCOUNTER — Ambulatory Visit: Payer: PPO | Admitting: Sports Medicine

## 2020-10-01 ENCOUNTER — Ambulatory Visit: Payer: PPO | Attending: Sports Medicine | Admitting: Physical Therapy

## 2020-10-01 ENCOUNTER — Other Ambulatory Visit: Payer: Self-pay

## 2020-10-01 DIAGNOSIS — M79601 Pain in right arm: Secondary | ICD-10-CM | POA: Diagnosis not present

## 2020-10-01 DIAGNOSIS — R293 Abnormal posture: Secondary | ICD-10-CM | POA: Insufficient documentation

## 2020-10-01 DIAGNOSIS — M25552 Pain in left hip: Secondary | ICD-10-CM | POA: Insufficient documentation

## 2020-10-01 DIAGNOSIS — S72002S Fracture of unspecified part of neck of left femur, sequela: Secondary | ICD-10-CM | POA: Insufficient documentation

## 2020-10-01 DIAGNOSIS — M6281 Muscle weakness (generalized): Secondary | ICD-10-CM | POA: Diagnosis not present

## 2020-10-01 NOTE — Addendum Note (Signed)
Addended by: Raeford Razor L on: 10/01/2020 12:08 PM   Modules accepted: Orders
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
## 2020-10-06 ENCOUNTER — Other Ambulatory Visit (HOSPITAL_COMMUNITY): Payer: Self-pay | Admitting: Family Medicine

## 2020-10-06 ENCOUNTER — Encounter: Payer: Self-pay | Admitting: Interventional Cardiology

## 2020-10-06 ENCOUNTER — Other Ambulatory Visit: Payer: Self-pay

## 2020-10-06 ENCOUNTER — Ambulatory Visit: Payer: PPO | Admitting: Interventional Cardiology

## 2020-10-06 VITALS — BP 138/82 | HR 59 | Ht 67.0 in | Wt 133.0 lb

## 2020-10-06 DIAGNOSIS — I712 Thoracic aortic aneurysm, without rupture, unspecified: Secondary | ICD-10-CM

## 2020-10-06 DIAGNOSIS — Z952 Presence of prosthetic heart valve: Secondary | ICD-10-CM

## 2020-10-06 DIAGNOSIS — R030 Elevated blood-pressure reading, without diagnosis of hypertension: Secondary | ICD-10-CM

## 2020-10-06 DIAGNOSIS — I480 Paroxysmal atrial fibrillation: Secondary | ICD-10-CM | POA: Diagnosis not present

## 2020-10-06 MED FILL — LEVOTHYROXINE 100 MCG TABLE: 100 | 90 days supply | Qty: 90 | Fill #0

## 2020-10-06 MED FILL — levoFLOXacin 500 MG TABS: 500 | 30 days supply | Qty: 30 | Fill #0

## 2020-10-06 NOTE — Patient Instructions (Signed)
Medication Instructions:  Your physician recommends that you continue on your current medications as directed. Please refer to the Current Medication list given to you today.  *If you need a refill on your cardiac medications before your next appointment, please call your pharmacy*   Lab Work: NONE If you have labs (blood work) drawn today and your tests are completely normal, you will receive your results only by: Marland Kitchen MyChart Message (if you have MyChart) OR . A paper copy in the mail If you have any lab test that is abnormal or we need to change your treatment, we will call you to review the results.   Testing/Procedures: NONE   Follow-Up: At [facility], you and your health needs are our priority.  As part of our continuing mission to provide you with exceptional heart care, we have created designated Provider Care Teams.  These Care Teams include your primary Cardiologist (physician) and Advanced Practice Providers (APPs -  Physician Assistants and Nurse Practitioners) who all work together to provide you with the care you need, when you need it.   Your next appointment:   6 month(s)  The format for your next appointment:   In Person  Provider:   You may see Larae Grooms, MD or one of the following Advanced Practice Providers on your designated Care Team:    Melina Copa, PA-C  Ermalinda Barrios, PA-C

## 2020-10-16 ENCOUNTER — Encounter: Payer: Self-pay | Admitting: Family Medicine

## 2020-10-16 DIAGNOSIS — C44319 Basal cell carcinoma of skin of other parts of face: Secondary | ICD-10-CM | POA: Diagnosis not present

## 2020-10-27 ENCOUNTER — Encounter (HOSPITAL_COMMUNITY): Payer: Self-pay | Admitting: Interventional Cardiology

## 2020-10-27 DIAGNOSIS — M85851 Other specified disorders of bone density and structure, right thigh: Secondary | ICD-10-CM | POA: Diagnosis not present

## 2020-11-03 ENCOUNTER — Ambulatory Visit: Payer: PPO | Admitting: Sports Medicine

## 2020-11-03 ENCOUNTER — Other Ambulatory Visit: Payer: Self-pay

## 2020-11-03 DIAGNOSIS — S72142D Displaced intertrochanteric fracture of left femur, subsequent encounter for closed fracture with routine healing: Secondary | ICD-10-CM | POA: Diagnosis not present

## 2020-11-03 NOTE — Progress Notes (Signed)
Left greater trochanteric hip fracture  Patient returns after several sessions of PT for residual weakness and gait issues following repair of a GT hip fracture Note he now has an acquired LLI with left leg 2.5 cm short  Last visit gait was markedly internally rotated and not that stable Hip weakness noted  After PT sessions he clearly feels more stable Walking normally and for ADLs seems OK with orthotic and lift Swimming causes no pain Biking - he feels he is getting much stronger and does 1 hour daily of stationary bike  ROS Much less groin pain No falls Easier to turn around  PE Thin W M in NAD BP 112/80   Ht 5\' 7"  (1.702 m)   Wt 134 lb (60.8 kg)   BMI 20.99 kg/m   Left leg short by 2.5 cms Excellent hip flexion and hip extension strength Weakness on hip abduction and hip adduction Hip ROM improved on rotation No groin pain on testing  Gait With correction - no trendelenburg Much less IR of foot Walks comfortably with no pain

## 2020-11-03 NOTE — Assessment & Plan Note (Signed)
He has continued to make progress toward a more normal gait Good exercise tolerance for swimming and biking  We will add some light abduction/ adduction Begin sone 1 leg strength and balance on left Cont. His biking and swimming  See me in 2 months

## 2020-11-03 NOTE — Patient Instructions (Signed)
You have a great improvement in your walking gait  You are weak on abduction - hip out You are weak on adduction - hip in  Lateral leg lift Inside leg lift  Use 2 lb ankle weight Build up to 2 sets of 10 reps with each of these exercises  Keep up biking and swimming  Add 1 leg standing on left Add mini- knee bend on left  Build up to 2 sets of 5  See me in 2 to 3 months

## 2020-11-17 DIAGNOSIS — E039 Hypothyroidism, unspecified: Secondary | ICD-10-CM | POA: Diagnosis not present

## 2020-11-17 DIAGNOSIS — E538 Deficiency of other specified B group vitamins: Secondary | ICD-10-CM | POA: Diagnosis not present

## 2020-11-17 DIAGNOSIS — M8580 Other specified disorders of bone density and structure, unspecified site: Secondary | ICD-10-CM | POA: Diagnosis not present

## 2020-11-17 DIAGNOSIS — D7589 Other specified diseases of blood and blood-forming organs: Secondary | ICD-10-CM | POA: Diagnosis not present

## 2020-11-17 DIAGNOSIS — Z125 Encounter for screening for malignant neoplasm of prostate: Secondary | ICD-10-CM | POA: Diagnosis not present

## 2020-11-19 ENCOUNTER — Other Ambulatory Visit (HOSPITAL_COMMUNITY): Payer: Self-pay | Admitting: Family Medicine

## 2020-11-19 MED FILL — LEVOTHYROXINE SODIUM 112 MC: 112 | 90 days supply | Qty: 90 | Fill #0

## 2020-12-03 ENCOUNTER — Other Ambulatory Visit: Payer: Self-pay

## 2020-12-03 ENCOUNTER — Ambulatory Visit (HOSPITAL_COMMUNITY): Payer: PPO | Attending: Cardiovascular Disease

## 2020-12-03 DIAGNOSIS — Z952 Presence of prosthetic heart valve: Secondary | ICD-10-CM | POA: Insufficient documentation

## 2020-12-03 LAB — ECHOCARDIOGRAM COMPLETE
AR max vel: 0.97 cm2
AV Area VTI: 0.84 cm2
AV Area mean vel: 0.86 cm2
AV Mean grad: 27 mmHg
AV Peak grad: 49.8 mmHg
Ao pk vel: 3.53 m/s
Area-P 1/2: 2.73 cm2
S' Lateral: 2.6 cm

## 2020-12-24 DIAGNOSIS — L570 Actinic keratosis: Secondary | ICD-10-CM | POA: Diagnosis not present

## 2021-01-22 DIAGNOSIS — R2232 Localized swelling, mass and lump, left upper limb: Secondary | ICD-10-CM | POA: Diagnosis not present

## 2021-01-22 DIAGNOSIS — R21 Rash and other nonspecific skin eruption: Secondary | ICD-10-CM | POA: Diagnosis not present

## 2021-01-22 DIAGNOSIS — L259 Unspecified contact dermatitis, unspecified cause: Secondary | ICD-10-CM | POA: Diagnosis not present

## 2021-01-22 DIAGNOSIS — M79632 Pain in left forearm: Secondary | ICD-10-CM | POA: Diagnosis not present

## 2021-01-27 ENCOUNTER — Other Ambulatory Visit: Payer: Self-pay | Admitting: Cardiothoracic Surgery

## 2021-01-27 DIAGNOSIS — I712 Thoracic aortic aneurysm, without rupture, unspecified: Secondary | ICD-10-CM

## 2021-02-04 ENCOUNTER — Ambulatory Visit: Payer: PPO | Admitting: Sports Medicine

## 2021-02-10 ENCOUNTER — Other Ambulatory Visit (HOSPITAL_COMMUNITY): Payer: Self-pay

## 2021-02-10 MED ORDER — LEVOTHYROXINE SODIUM 112 MCG PO TABS
ORAL_TABLET | ORAL | 0 refills | Status: DC
  Filled 2021-02-10: qty 90, 90d supply, fill #0

## 2021-02-15 ENCOUNTER — Other Ambulatory Visit (HOSPITAL_COMMUNITY): Payer: Self-pay

## 2021-02-18 DIAGNOSIS — E039 Hypothyroidism, unspecified: Secondary | ICD-10-CM | POA: Diagnosis not present

## 2021-02-18 DIAGNOSIS — E538 Deficiency of other specified B group vitamins: Secondary | ICD-10-CM | POA: Diagnosis not present

## 2021-03-18 ENCOUNTER — Other Ambulatory Visit: Payer: Self-pay

## 2021-03-18 ENCOUNTER — Ambulatory Visit
Admission: RE | Admit: 2021-03-18 | Discharge: 2021-03-18 | Disposition: A | Discharge status: Home / Self Care | Payer: PPO | Source: Ambulatory Visit | Attending: Cardiothoracic Surgery | Admitting: Cardiothoracic Surgery

## 2021-03-18 ENCOUNTER — Ambulatory Visit: Payer: PPO | Admitting: Cardiothoracic Surgery

## 2021-03-18 VITALS — BP 140/78 | HR 55 | Resp 20 | Ht 67.0 in | Wt 134.0 lb

## 2021-03-18 DIAGNOSIS — Z95828 Presence of other vascular implants and grafts: Secondary | ICD-10-CM

## 2021-03-18 DIAGNOSIS — I712 Thoracic aortic aneurysm, without rupture, unspecified: Secondary | ICD-10-CM

## 2021-03-18 DIAGNOSIS — I517 Cardiomegaly: Secondary | ICD-10-CM | POA: Diagnosis not present

## 2021-03-18 DIAGNOSIS — Z952 Presence of prosthetic heart valve: Secondary | ICD-10-CM | POA: Diagnosis not present

## 2021-03-18 DIAGNOSIS — I2510 Atherosclerotic heart disease of native coronary artery without angina pectoris: Secondary | ICD-10-CM | POA: Diagnosis not present

## 2021-03-18 MED ORDER — IOPAMIDOL (ISOVUE-370) INJECTION 76%
75.0000 mL | Freq: Once | INTRAVENOUS | Status: AC | PRN
  Administered 2021-03-18: 75 mL via INTRAVENOUS
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
## 2021-03-31 DIAGNOSIS — D225 Melanocytic nevi of trunk: Secondary | ICD-10-CM | POA: Diagnosis not present

## 2021-03-31 DIAGNOSIS — L570 Actinic keratosis: Secondary | ICD-10-CM | POA: Diagnosis not present

## 2021-03-31 DIAGNOSIS — C44319 Basal cell carcinoma of skin of other parts of face: Secondary | ICD-10-CM | POA: Diagnosis not present

## 2021-03-31 DIAGNOSIS — L578 Other skin changes due to chronic exposure to nonionizing radiation: Secondary | ICD-10-CM | POA: Diagnosis not present

## 2021-03-31 DIAGNOSIS — D485 Neoplasm of uncertain behavior of skin: Secondary | ICD-10-CM | POA: Diagnosis not present

## 2021-03-31 DIAGNOSIS — Z85828 Personal history of other malignant neoplasm of skin: Secondary | ICD-10-CM | POA: Diagnosis not present

## 2021-03-31 DIAGNOSIS — L814 Other melanin hyperpigmentation: Secondary | ICD-10-CM | POA: Diagnosis not present

## 2021-03-31 DIAGNOSIS — L821 Other seborrheic keratosis: Secondary | ICD-10-CM | POA: Diagnosis not present

## 2021-04-14 ENCOUNTER — Other Ambulatory Visit (HOSPITAL_COMMUNITY): Payer: Self-pay

## 2021-04-14 MED ORDER — PEG 3350-KCL-NA BICARB-NACL 420 G PO SOLR
ORAL | 0 refills | Status: DC
  Filled 2021-04-14: qty 4000, 1d supply, fill #0

## 2021-04-15 DIAGNOSIS — Z1211 Encounter for screening for malignant neoplasm of colon: Secondary | ICD-10-CM | POA: Diagnosis not present

## 2021-04-15 DIAGNOSIS — D128 Benign neoplasm of rectum: Secondary | ICD-10-CM | POA: Diagnosis not present

## 2021-04-15 DIAGNOSIS — K5730 Diverticulosis of large intestine without perforation or abscess without bleeding: Secondary | ICD-10-CM | POA: Diagnosis not present

## 2021-04-20 DIAGNOSIS — D128 Benign neoplasm of rectum: Secondary | ICD-10-CM | POA: Diagnosis not present

## 2021-05-14 DIAGNOSIS — S81801D Unspecified open wound, right lower leg, subsequent encounter: Secondary | ICD-10-CM | POA: Diagnosis not present

## 2021-05-18 ENCOUNTER — Other Ambulatory Visit (HOSPITAL_COMMUNITY): Payer: Self-pay

## 2021-05-18 ENCOUNTER — Other Ambulatory Visit: Payer: Self-pay | Admitting: Interventional Cardiology

## 2021-05-18 MED ORDER — LEVOTHYROXINE SODIUM 112 MCG PO TABS
112.0000 ug | ORAL_TABLET | Freq: Every day | ORAL | 1 refills | Status: DC
  Filled 2021-05-18: qty 90, 90d supply, fill #0
  Filled 2021-08-17: qty 90, 90d supply, fill #1

## 2021-05-18 MED ORDER — AMOXICILLIN 500 MG PO CAPS
ORAL_CAPSULE | ORAL | 99 refills | Status: DC
  Filled 2021-05-18: qty 8, 2d supply, fill #0

## 2021-05-19 ENCOUNTER — Other Ambulatory Visit (HOSPITAL_COMMUNITY): Payer: Self-pay

## 2021-05-25 DIAGNOSIS — C44319 Basal cell carcinoma of skin of other parts of face: Secondary | ICD-10-CM | POA: Diagnosis not present

## 2021-05-25 DIAGNOSIS — L988 Other specified disorders of the skin and subcutaneous tissue: Secondary | ICD-10-CM | POA: Diagnosis not present

## 2021-06-23 DIAGNOSIS — H353132 Nonexudative age-related macular degeneration, bilateral, intermediate dry stage: Secondary | ICD-10-CM | POA: Diagnosis not present

## 2021-06-23 DIAGNOSIS — H524 Presbyopia: Secondary | ICD-10-CM | POA: Diagnosis not present

## 2021-06-23 DIAGNOSIS — H2513 Age-related nuclear cataract, bilateral: Secondary | ICD-10-CM | POA: Diagnosis not present

## 2021-08-17 ENCOUNTER — Other Ambulatory Visit (HOSPITAL_COMMUNITY): Payer: Self-pay

## 2021-09-14 DIAGNOSIS — U071 COVID-19: Secondary | ICD-10-CM | POA: Diagnosis not present

## 2021-09-14 DIAGNOSIS — B349 Viral infection, unspecified: Secondary | ICD-10-CM | POA: Diagnosis not present

## 2021-10-05 DIAGNOSIS — Z125 Encounter for screening for malignant neoplasm of prostate: Secondary | ICD-10-CM | POA: Diagnosis not present

## 2021-10-05 DIAGNOSIS — Z23 Encounter for immunization: Secondary | ICD-10-CM | POA: Diagnosis not present

## 2021-10-05 DIAGNOSIS — N529 Male erectile dysfunction, unspecified: Secondary | ICD-10-CM | POA: Diagnosis not present

## 2021-10-05 DIAGNOSIS — Z1389 Encounter for screening for other disorder: Secondary | ICD-10-CM | POA: Diagnosis not present

## 2021-10-05 DIAGNOSIS — D531 Other megaloblastic anemias, not elsewhere classified: Secondary | ICD-10-CM | POA: Diagnosis not present

## 2021-10-05 DIAGNOSIS — I700 Atherosclerosis of aorta: Secondary | ICD-10-CM | POA: Diagnosis not present

## 2021-10-05 DIAGNOSIS — Z952 Presence of prosthetic heart valve: Secondary | ICD-10-CM | POA: Diagnosis not present

## 2021-10-05 DIAGNOSIS — Z0000 Encounter for general adult medical examination without abnormal findings: Secondary | ICD-10-CM | POA: Diagnosis not present

## 2021-10-05 DIAGNOSIS — E538 Deficiency of other specified B group vitamins: Secondary | ICD-10-CM | POA: Diagnosis not present

## 2021-10-05 DIAGNOSIS — I2510 Atherosclerotic heart disease of native coronary artery without angina pectoris: Secondary | ICD-10-CM | POA: Diagnosis not present

## 2021-10-05 DIAGNOSIS — M8580 Other specified disorders of bone density and structure, unspecified site: Secondary | ICD-10-CM | POA: Diagnosis not present

## 2021-10-05 DIAGNOSIS — D7589 Other specified diseases of blood and blood-forming organs: Secondary | ICD-10-CM | POA: Diagnosis not present

## 2021-10-05 DIAGNOSIS — E039 Hypothyroidism, unspecified: Secondary | ICD-10-CM | POA: Diagnosis not present

## 2021-10-05 DIAGNOSIS — K635 Polyp of colon: Secondary | ICD-10-CM | POA: Diagnosis not present

## 2021-10-07 DIAGNOSIS — L814 Other melanin hyperpigmentation: Secondary | ICD-10-CM | POA: Diagnosis not present

## 2021-10-07 DIAGNOSIS — L570 Actinic keratosis: Secondary | ICD-10-CM | POA: Diagnosis not present

## 2021-10-07 DIAGNOSIS — D485 Neoplasm of uncertain behavior of skin: Secondary | ICD-10-CM | POA: Diagnosis not present

## 2021-10-07 DIAGNOSIS — C44511 Basal cell carcinoma of skin of breast: Secondary | ICD-10-CM | POA: Diagnosis not present

## 2021-10-07 DIAGNOSIS — Z85828 Personal history of other malignant neoplasm of skin: Secondary | ICD-10-CM | POA: Diagnosis not present

## 2021-10-07 DIAGNOSIS — D044 Carcinoma in situ of skin of scalp and neck: Secondary | ICD-10-CM | POA: Diagnosis not present

## 2021-10-07 DIAGNOSIS — L821 Other seborrheic keratosis: Secondary | ICD-10-CM | POA: Diagnosis not present

## 2021-10-07 DIAGNOSIS — C44612 Basal cell carcinoma of skin of right upper limb, including shoulder: Secondary | ICD-10-CM | POA: Diagnosis not present

## 2021-10-07 DIAGNOSIS — D225 Melanocytic nevi of trunk: Secondary | ICD-10-CM | POA: Diagnosis not present

## 2021-10-07 DIAGNOSIS — L578 Other skin changes due to chronic exposure to nonionizing radiation: Secondary | ICD-10-CM | POA: Diagnosis not present

## 2021-10-15 DIAGNOSIS — L570 Actinic keratosis: Secondary | ICD-10-CM | POA: Diagnosis not present

## 2021-10-27 ENCOUNTER — Other Ambulatory Visit (HOSPITAL_COMMUNITY): Payer: Self-pay

## 2021-10-27 MED ORDER — LEVOTHYROXINE SODIUM 112 MCG PO TABS
112.0000 ug | ORAL_TABLET | Freq: Every day | ORAL | 3 refills | Status: DC
  Filled 2021-10-27 – 2021-10-28 (×2): qty 90, 90d supply, fill #0
  Filled 2022-01-27: qty 90, 90d supply, fill #1
  Filled 2022-03-01: qty 90, 90d supply, fill #2

## 2021-10-28 ENCOUNTER — Other Ambulatory Visit (HOSPITAL_COMMUNITY): Payer: Self-pay

## 2021-11-01 ENCOUNTER — Other Ambulatory Visit (HOSPITAL_COMMUNITY): Payer: Self-pay

## 2021-11-30 DIAGNOSIS — D044 Carcinoma in situ of skin of scalp and neck: Secondary | ICD-10-CM | POA: Diagnosis not present

## 2021-11-30 DIAGNOSIS — C44519 Basal cell carcinoma of skin of other part of trunk: Secondary | ICD-10-CM | POA: Diagnosis not present

## 2021-11-30 DIAGNOSIS — L988 Other specified disorders of the skin and subcutaneous tissue: Secondary | ICD-10-CM | POA: Diagnosis not present

## 2021-12-21 ENCOUNTER — Other Ambulatory Visit (HOSPITAL_COMMUNITY): Payer: Self-pay

## 2021-12-21 DIAGNOSIS — C44511 Basal cell carcinoma of skin of breast: Secondary | ICD-10-CM | POA: Diagnosis not present

## 2021-12-21 MED ORDER — CEPHALEXIN 500 MG PO CAPS
500.0000 mg | ORAL_CAPSULE | Freq: Two times a day (BID) | ORAL | 0 refills | Status: DC
  Filled 2021-12-21: qty 20, 10d supply, fill #0

## 2022-01-04 ENCOUNTER — Telehealth: Payer: Self-pay | Admitting: Interventional Cardiology

## 2022-01-04 NOTE — Telephone Encounter (Signed)
STAT if HR is under 50 or over 120 ?(normal HR is 60-100 beats per minute) ? ?What is your heart rate?  ?98 currently ? ?Do you have a log of your heart rate readings (document readings)?  ? ?Do you have any other symptoms?  ? ?Past 24 HR resting has been between 75-90, but it is normally in the low 50's  ? ?

## 2022-01-04 NOTE — Telephone Encounter (Signed)
Spoke with the patient who states that when he was exercising this morning he noticed that his heart rate was much higher than it typically is. He states that he continued to monitor it after he exercised and it did not come down to his usual resting rate. He looked back on his Apple Watch and noticed that the higher than usual rate started yesterday. He states that his resting heart rate has been jumping around anywhere from 75-98. Typically his heart rate resting is in the 50s. He states that he has done EKG's on his apple watch and they look out of rhythm but it is not giving him any indication of abnormality such as Afib. He states that he is not having any palpitations, SOB, or dizziness. He feels like he is out of rhythm though. He is currently out of town but is flying back tomorrow afternoon. I have scheduled him for a nurse visit for an EKG on Thursday. He is going to attempt to upload readings from his [place] later today. ?

## 2022-01-06 ENCOUNTER — Other Ambulatory Visit (HOSPITAL_COMMUNITY): Payer: Self-pay

## 2022-01-06 ENCOUNTER — Ambulatory Visit (INDEPENDENT_AMBULATORY_CARE_PROVIDER_SITE_OTHER): Payer: PPO | Admitting: Interventional Cardiology

## 2022-01-06 ENCOUNTER — Ambulatory Visit: Payer: PPO

## 2022-01-06 VITALS — BP 127/84 | HR 63 | Ht 67.0 in | Wt 135.0 lb

## 2022-01-06 VITALS — BP 127/84 | HR 63 | Ht 67.0 in | Wt 135.4 lb

## 2022-01-06 DIAGNOSIS — Z952 Presence of prosthetic heart valve: Secondary | ICD-10-CM | POA: Diagnosis not present

## 2022-01-06 DIAGNOSIS — I4891 Unspecified atrial fibrillation: Secondary | ICD-10-CM | POA: Diagnosis not present

## 2022-01-06 DIAGNOSIS — I7121 Aneurysm of the ascending aorta, without rupture: Secondary | ICD-10-CM | POA: Diagnosis not present

## 2022-01-06 DIAGNOSIS — I480 Paroxysmal atrial fibrillation: Secondary | ICD-10-CM | POA: Diagnosis not present

## 2022-01-06 MED ORDER — APIXABAN 5 MG PO TABS
5.0000 mg | ORAL_TABLET | Freq: Two times a day (BID) | ORAL | 6 refills | Status: DC
  Filled 2022-01-06: qty 60, 30d supply, fill #0
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
## 2022-01-06 NOTE — Patient Instructions (Addendum)
Medication Instructions:  ?Your physician has recommended you make the following change in your medication: Start Eliquis 5 mg by mouth twice daily ? ?*If you need a refill on your cardiac medications before your next appointment, please call your pharmacy* ? ? ?Lab Work: ?none ?If you have labs (blood work) drawn today and your tests are completely normal, you will receive your results only by: ?MyChart Message (if you have MyChart) OR ?A paper copy in the mail ?If you have any lab test that is abnormal or we need to change your treatment, we will call you to review the results. ? ? ?Testing/Procedures: ?Your physician has requested that you have an echocardiogram. Echocardiography is a painless test that uses sound waves to create images of your heart. It provides your doctor with information about the size and shape of your heart and how well your heart?s chambers and valves are working. This procedure takes approximately one hour. There are no restrictions for this procedure. ? ?Your physician has recommended that you have a Cardioversion (DCCV). Electrical Cardioversion uses a jolt of electricity to your heart either through paddles or wired patches attached to your chest. This is a controlled, usually prescheduled, procedure. Defibrillation is done under light anesthesia in the hospital, and you usually go home the day of the procedure. This is done to get your heart back into a normal rhythm. You are not awake for the procedure. Please see the instruction sheet given to you today. ?Scheduled for Feb 03, 2022 ? ? ?Follow-Up: ?At [facility], you and your health needs are our priority.  As part of our continuing mission to provide you with exceptional heart care, we have created designated Provider Care Teams.  These Care Teams include your primary Cardiologist (physician) and Advanced Practice Providers (APPs -  Physician Assistants and Nurse Practitioners) who all work together to provide you with the care  you need, when you need it. ? ?We recommend signing up for the patient portal called "MyChart".  Sign up information is provided on this After Visit Summary.  MyChart is used to connect with patients for Virtual Visits (Telemedicine).  Patients are able to view lab/test results, encounter notes, upcoming appointments, etc.  Non-urgent messages can be sent to your provider as well.   ?To learn more about what you can do with MyChart, go to [email or web address].   ? ?Your next appointment:   ?6 week(s) ? ?The format for your next appointment:   ?In Person ? ?Provider:   ?You will follow up in the [facility] located at [facility]. ?Your provider will be: ?Roderic Palau, NP or Clint R. Fenton, PA-C  ? ? ?Other Instructions ?  ?You are scheduled for a   Cardioversion on Feb 03, 2022 with Dr. Harrell Gave.  Please arrive at the [facility] (Main Entrance A) at [facility]: [address] [place], [place] [address] at 6:00 am ( ?DIET: Nothing to eat or drink after midnight except a sip of water with medications (see medication instructions below) ? ?FYI: For your safety, and to allow [place] to monitor your vital signs accurately during the surgery/procedure we request that   ?if you have artificial nails, gel coating, SNS etc. Please have those removed prior to your surgery/procedure. Not having the nail coverings /polish removed may result in cancellation or delay of your surgery/procedure. ? ? ?Medication Instructions: ?  ? ?Continue your anticoagulant: Eliquis ?You will need to continue your anticoagulant after your procedure until you  are told by your  ?Provider that it is safe to stop ? ? ?Labs:  ? your lab work will be done at the hospital prior to your procedure  ? ? ?You must have a responsible person to drive you home and stay in the waiting area during your procedure. Failure to do so could result in cancellation. ? ?Interior and spatial designer cards. ? ?*Special Note: Every effort is  made to have your procedure done on time. Occasionally there are emergencies that occur at the hospital that may cause delays. Please be patient if a delay does occur.   ? ? ? ? ?AFIB CLINIC INFORMATION: ? Please arrive 15 minutes early for check-in. ?The AFib Clinic is located in the Heart and Vascular Specialty Clinics at [facility]. ?Parking instructions/directions: [address] (off [address]). When you pull in to Entrance C, there is an underground parking garage to your right. The code to enter the garage is 1004 Take the elevators to the first floor. Follow the signs to the Heart and Vascular Specialty Clinics. You will see registration at the end of the hallway.  ?Phone number: [phone] ? ? ? ?Important Information About Sugar ? ? ? ? ? ? ?

## 2022-01-06 NOTE — Patient Instructions (Signed)
Medication Instructions:  ?TBD after Office visit with Dr. Irish Lack ?*If you need a refill on your cardiac medications before your next appointment, please call your pharmacy* ? ? ?Lab Work: ?TBD ?If you have labs (blood work) drawn today and your tests are completely normal, you will receive your results only by: ?MyChart Message (if you have MyChart) OR ?A paper copy in the mail ?If you have any lab test that is abnormal or we need to change your treatment, we will call you to review the results. ? ? ?Testing/Procedures: ?TBD ? ? ?Follow-Up: As scheduled ?At [facility], you and your health needs are our priority.  As part of our continuing mission to provide you with exceptional heart care, we have created designated Provider Care Teams.  These Care Teams include your primary Cardiologist (physician) and Advanced Practice Providers (APPs -  Physician Assistants and Nurse Practitioners) who all work together to provide you with the care you need, when you need it. ? ?We recommend signing up for the patient portal called "MyChart".  Sign up information is provided on this After Visit Summary.  MyChart is used to connect with patients for Virtual Visits (Telemedicine).  Patients are able to view lab/test results, encounter notes, upcoming appointments, etc.  Non-urgent messages can be sent to your provider as well.   ?To learn more about what you can do with MyChart, go to [email or web address].   ? ?The format for your next appointment:   ?In Person ? ?Provider:   ?Larae Grooms, MD   ? ? ? ?Important Information About Sugar ? ? ? ? ?  ?

## 2022-01-06 NOTE — Progress Notes (Signed)
? ?  Nurse Visit  ? ?Date of Encounter: 01/06/2022 ?ID: GORMAN SAFI, DOB 06-30-1951, MRN [id number] ? ?PCP:  Kathyrn Lass, MD ?  ?[facility] Providers ?Cardiologist:  Larae Grooms, MD  ?   ? ? ?Visit Details  ? ?VS:  BP 127/84 (BP Location: Left Arm, Patient Position: Sitting, Cuff Size: Normal)   Pulse 63   Ht '5\' 7"'$  (1.702 m)   Wt 135 lb 6.4 oz (61.4 kg)   BMI 21.21 kg/m?  , BMI Body mass index is 21.21 kg/m?. ? ?Wt Readings from Last 3 Encounters:  ?01/06/22 135 lb 6.4 oz (61.4 kg)  ?03/18/21 134 lb (60.8 kg)  ?11/03/20 134 lb (60.8 kg)  ?  ? ?Reason for visit: EKG ?Performed today: Vitals, EKG, Provider consulted:Dr. Irish Lack, and Education ?Changes (medications, testing, etc.) : Nurse visit transferred to [place] ?Length of Visit: 40 minutes ? ? ? ?Medications Adjustments/Labs and Tests Ordered: ?Orders Placed This Encounter  ?Procedures  ? EKG 12-Lead  ? ?No orders of the defined types were placed in this encounter. ? ? ? ?Signed, ?Precious Gilding, RN  ?01/06/2022 11:56 AM  ?

## 2022-01-12 ENCOUNTER — Ambulatory Visit: Payer: PPO | Admitting: Interventional Cardiology

## 2022-01-20 ENCOUNTER — Other Ambulatory Visit (HOSPITAL_COMMUNITY): Payer: PPO

## 2022-01-25 ENCOUNTER — Ambulatory Visit (HOSPITAL_COMMUNITY): Payer: PPO | Attending: Internal Medicine

## 2022-01-25 DIAGNOSIS — I4891 Unspecified atrial fibrillation: Secondary | ICD-10-CM | POA: Diagnosis not present

## 2022-01-25 LAB — ECHOCARDIOGRAM COMPLETE
AR max vel: 1.28 cm2
AV Area VTI: 1.51 cm2
AV Area mean vel: 1.43 cm2
AV Mean grad: 13.6 mmHg
AV Peak grad: 26.9 mmHg
Ao pk vel: 2.59 m/s
Area-P 1/2: 4.35 cm2
S' Lateral: 2.6 cm

## 2022-01-26 ENCOUNTER — Encounter: Payer: Self-pay | Admitting: Interventional Cardiology

## 2022-01-27 ENCOUNTER — Other Ambulatory Visit (HOSPITAL_COMMUNITY): Payer: Self-pay

## 2022-01-27 ENCOUNTER — Encounter (HOSPITAL_COMMUNITY): Payer: Self-pay | Admitting: Cardiology

## 2022-01-27 DIAGNOSIS — C44729 Squamous cell carcinoma of skin of left lower limb, including hip: Secondary | ICD-10-CM | POA: Diagnosis not present

## 2022-01-27 DIAGNOSIS — D485 Neoplasm of uncertain behavior of skin: Secondary | ICD-10-CM | POA: Diagnosis not present

## 2022-01-27 NOTE — Progress Notes (Signed)
Attempted to obtain medical history via telephone, unable to reach at this time. HIPAA compliant voicemail message left requesting return call to pre surgical testing department. 

## 2022-02-02 ENCOUNTER — Other Ambulatory Visit: Payer: Self-pay | Admitting: Interventional Cardiology

## 2022-02-02 DIAGNOSIS — I480 Paroxysmal atrial fibrillation: Secondary | ICD-10-CM

## 2022-02-02 NOTE — Anesthesia Preprocedure Evaluation (Addendum)
Anesthesia Evaluation  Patient identified by MRN, date of birth, ID band  Reviewed: Allergy & Precautions, NPO status , Patient's Chart, lab work & pertinent test results  Airway Mallampati: II  TM Distance: >3 FB Neck ROM: Full    Dental no notable dental hx.    Pulmonary neg pulmonary ROS,    Pulmonary exam normal breath sounds clear to auscultation       Cardiovascular + CAD  Normal cardiovascular exam+ dysrhythmias Atrial Fibrillation + Valvular Problems/Murmurs (s/p AVR in 2018)  Rhythm:Regular Rate:Normal  Thoracic aortic aneurysm   Neuro/Psych  Neuromuscular disease (cervical radiculopathy) negative psych ROS   GI/Hepatic negative GI ROS, Neg liver ROS,   Endo/Other  negative endocrine ROS  Renal/GU negative Renal ROS  negative genitourinary   Musculoskeletal negative musculoskeletal ROS (+)   Abdominal   Peds negative pediatric ROS (+)  Hematology negative hematology ROS (+)   Anesthesia Other Findings   Reproductive/Obstetrics negative OB ROS                            Anesthesia Physical Anesthesia Plan  ASA: 3  Anesthesia Plan: General   Post-op Pain Management:    Induction: Intravenous  PONV Risk Score and Plan: 2 and Propofol infusion and Treatment may vary due to age or medical condition  Airway Management Planned: Mask  Additional Equipment: None  Intra-op Plan:   Post-operative Plan:   Informed Consent: I have reviewed the patients History and Physical, chart, labs and discussed the procedure including the risks, benefits and alternatives for the proposed anesthesia with the patient or authorized representative who has indicated his/her understanding and acceptance.       Plan Discussed with: CRNA and Anesthesiologist  Anesthesia Plan Comments:        Anesthesia Quick Evaluation

## 2022-02-03 ENCOUNTER — Ambulatory Visit (HOSPITAL_COMMUNITY)
Admission: RE | Admit: 2022-02-03 | Discharge: 2022-02-03 | Disposition: A | Discharge status: Home / Self Care | Payer: PPO | Attending: Cardiology | Admitting: Cardiology

## 2022-02-03 ENCOUNTER — Ambulatory Visit (HOSPITAL_BASED_OUTPATIENT_CLINIC_OR_DEPARTMENT_OTHER): Payer: PPO | Admitting: Anesthesiology

## 2022-02-03 ENCOUNTER — Encounter (HOSPITAL_COMMUNITY): Payer: Self-pay | Admitting: Cardiology

## 2022-02-03 ENCOUNTER — Encounter (HOSPITAL_COMMUNITY)
Admission: RE | Disposition: A | Discharge status: Home / Self Care | Payer: Self-pay | Source: Home / Self Care | Attending: Cardiology

## 2022-02-03 ENCOUNTER — Ambulatory Visit (HOSPITAL_COMMUNITY): Payer: PPO | Admitting: Anesthesiology

## 2022-02-03 ENCOUNTER — Other Ambulatory Visit: Payer: Self-pay

## 2022-02-03 DIAGNOSIS — I480 Paroxysmal atrial fibrillation: Secondary | ICD-10-CM | POA: Diagnosis not present

## 2022-02-03 DIAGNOSIS — M5412 Radiculopathy, cervical region: Secondary | ICD-10-CM | POA: Diagnosis not present

## 2022-02-03 DIAGNOSIS — Z9889 Other specified postprocedural states: Secondary | ICD-10-CM | POA: Diagnosis not present

## 2022-02-03 DIAGNOSIS — I2510 Atherosclerotic heart disease of native coronary artery without angina pectoris: Secondary | ICD-10-CM | POA: Diagnosis not present

## 2022-02-03 DIAGNOSIS — Z952 Presence of prosthetic heart valve: Secondary | ICD-10-CM | POA: Diagnosis not present

## 2022-02-03 DIAGNOSIS — I517 Cardiomegaly: Secondary | ICD-10-CM | POA: Insufficient documentation

## 2022-02-03 DIAGNOSIS — I493 Ventricular premature depolarization: Secondary | ICD-10-CM | POA: Diagnosis not present

## 2022-02-03 DIAGNOSIS — I4819 Other persistent atrial fibrillation: Secondary | ICD-10-CM | POA: Insufficient documentation

## 2022-02-03 DIAGNOSIS — I491 Atrial premature depolarization: Secondary | ICD-10-CM | POA: Insufficient documentation

## 2022-02-03 DIAGNOSIS — I4891 Unspecified atrial fibrillation: Secondary | ICD-10-CM | POA: Diagnosis not present

## 2022-02-03 DIAGNOSIS — G709 Myoneural disorder, unspecified: Secondary | ICD-10-CM | POA: Diagnosis not present

## 2022-02-03 HISTORY — PX: CARDIOVERSION: SHX1299

## 2022-02-03 LAB — POCT I-STAT, CHEM 8
BUN: 34 mg/dL — ABNORMAL HIGH (ref 8–23)
Calcium, Ion: 1.19 mmol/L (ref 1.15–1.40)
Chloride: 104 mmol/L (ref 98–111)
Creatinine, Ser: 0.7 mg/dL (ref 0.61–1.24)
Glucose, Bld: 104 mg/dL — ABNORMAL HIGH (ref 70–99)
HCT: 48 % (ref 39.0–52.0)
Hemoglobin: 16.3 g/dL (ref 13.0–17.0)
Potassium: 4.2 mmol/L (ref 3.5–5.1)
Sodium: 139 mmol/L (ref 135–145)
TCO2: 27 mmol/L (ref 22–32)

## 2022-02-03 SURGERY — CARDIOVERSION
Anesthesia: General

## 2022-02-03 MED ORDER — PROPOFOL 10 MG/ML IV BOLUS
INTRAVENOUS | Status: DC | PRN
  Administered 2022-02-03: 80 mg via INTRAVENOUS

## 2022-02-03 MED ORDER — LIDOCAINE 2% (20 MG/ML) 5 ML SYRINGE
INTRAMUSCULAR | Status: DC | PRN
  Administered 2022-02-03: 80 mg via INTRAVENOUS

## 2022-02-03 MED ORDER — HYDROCORTISONE 1 % EX CREA: 1.0000 "application " | TOPICAL_CREAM | Freq: Three times a day (TID) | CUTANEOUS | Status: DC | PRN

## 2022-02-03 MED ORDER — SODIUM CHLORIDE 0.9 % IV SOLN: INTRAVENOUS | Status: DC

## 2022-02-03 NOTE — Transfer of Care (Signed)
Immediate Anesthesia Transfer of Care Note  Patient: James Mayo  Procedure(s) Performed: CARDIOVERSION  Patient Location: Endoscopy Unit  Anesthesia Type:General  Level of Consciousness: awake, alert  and oriented  Airway & Oxygen Therapy: Patient Spontanous Breathing  Post-op Assessment: Report given to RN and Post -op Vital signs reviewed and stable  Post vital signs: Reviewed and stable  Last Vitals:  Vitals Value Taken Time  BP 148/112 02/03/22 7:44 AM   Temp    Pulse 53   Resp 16   SpO2 100% 02/03/22 7:44 AM    Last Pain:  Vitals:   02/03/22 0700  TempSrc: Oral  PainSc: 0-No pain         Complications: No notable events documented.

## 2022-02-03 NOTE — Interval H&P Note (Signed)
History and Physical Interval Note:  02/03/2022 7:29 AM  James Mayo  has presented today for surgery, with the diagnosis of AFIB.  The various methods of treatment have been discussed with the patient and family. After consideration of risks, benefits and other options for treatment, the patient has consented to  Procedure(s): CARDIOVERSION (N/A) as a surgical intervention.  The patient's history has been reviewed, patient examined, no change in status, stable for surgery.  I have reviewed the patient's chart and labs.  Questions were answered to the patient's satisfaction.     Amarii Bordas Harrell Gave

## 2022-02-03 NOTE — Anesthesia Postprocedure Evaluation (Signed)
Anesthesia Post Note  Patient: James Mayo  Procedure(s) Performed: CARDIOVERSION     Patient location during evaluation: PACU Anesthesia Type: General Level of consciousness: awake Pain management: pain level controlled Vital Signs Assessment: post-procedure vital signs reviewed and stable Respiratory status: spontaneous breathing and respiratory function stable Cardiovascular status: stable Postop Assessment: no apparent nausea or vomiting Anesthetic complications: no   No notable events documented.  Last Vitals:  Vitals:   02/03/22 0755 02/03/22 0805  BP: (!) 158/84 (!) 142/86  Pulse: (!) 45 (!) 43  Resp: 20 12  Temp:    SpO2: 100% 100%    Last Pain:  Vitals:   02/03/22 0805  TempSrc:   PainSc: 0-No pain                 [name]

## 2022-02-03 NOTE — CV Procedure (Signed)
Procedure:   DCCV  Indication:  Symptomatic atrial fibrillation  Procedure Note:  The patient signed informed consent.  They have had had therapeutic anticoagulation with apixaban greater than 3 weeks.  Anesthesia was administered by Dr. Sabra Heck and Dr. Elgie Congo.  Patient received 80 mg IV lidocaine and 80 mg IV propofol.Adequate airway was maintained throughout and vital followed per protocol.  They were cardioverted x 1 with 120J of biphasic synchronized energy.  They converted to sinus bradycardia with PACs.  There were no apparent complications.  The patient had normal neuro status and respiratory status post procedure with vitals stable as recorded elsewhere.    Follow up:  They will continue on current medical therapy and follow up with cardiology as scheduled.  Buford Dresser, MD PhD 02/03/2022 7:50 AM

## 2022-02-03 NOTE — Discharge Instructions (Signed)

## 2022-02-09 ENCOUNTER — Encounter: Payer: Self-pay | Admitting: Interventional Cardiology

## 2022-02-11 DIAGNOSIS — C44729 Squamous cell carcinoma of skin of left lower limb, including hip: Secondary | ICD-10-CM | POA: Diagnosis not present

## 2022-02-17 ENCOUNTER — Ambulatory Visit (HOSPITAL_COMMUNITY)
Admission: RE | Admit: 2022-02-17 | Discharge: 2022-02-17 | Disposition: A | Discharge status: Home / Self Care | Payer: PPO | Source: Ambulatory Visit | Attending: Physician Assistant | Admitting: Physician Assistant

## 2022-02-17 ENCOUNTER — Other Ambulatory Visit (HOSPITAL_COMMUNITY): Payer: Self-pay

## 2022-02-17 ENCOUNTER — Encounter (HOSPITAL_COMMUNITY): Payer: Self-pay | Admitting: Physician Assistant

## 2022-02-17 VITALS — BP 128/88 | HR 78 | Ht 67.0 in | Wt 135.0 lb

## 2022-02-17 DIAGNOSIS — I517 Cardiomegaly: Secondary | ICD-10-CM | POA: Diagnosis not present

## 2022-02-17 DIAGNOSIS — I4819 Other persistent atrial fibrillation: Secondary | ICD-10-CM | POA: Diagnosis not present

## 2022-02-17 DIAGNOSIS — I2510 Atherosclerotic heart disease of native coronary artery without angina pectoris: Secondary | ICD-10-CM | POA: Insufficient documentation

## 2022-02-17 DIAGNOSIS — Z79899 Other long term (current) drug therapy: Secondary | ICD-10-CM | POA: Diagnosis not present

## 2022-02-17 DIAGNOSIS — Z7901 Long term (current) use of anticoagulants: Secondary | ICD-10-CM | POA: Diagnosis not present

## 2022-02-17 DIAGNOSIS — I480 Paroxysmal atrial fibrillation: Secondary | ICD-10-CM

## 2022-02-17 DIAGNOSIS — Z952 Presence of prosthetic heart valve: Secondary | ICD-10-CM | POA: Diagnosis not present

## 2022-02-17 DIAGNOSIS — D6869 Other thrombophilia: Secondary | ICD-10-CM | POA: Diagnosis not present

## 2022-02-17 MED ORDER — AMIODARONE HCL 200 MG PO TABS
ORAL_TABLET | ORAL | 0 refills | Status: DC
  Filled 2022-02-17: qty 60, 30d supply, fill #0

## 2022-02-17 NOTE — Progress Notes (Signed)
Primary Care Physician: Kathyrn Lass, MD Primary Cardiologist: Dr Irish Lack  Primary Electrophysiologist: none Referring Physician: Dr Antony Madura is a 71 y.o. male with a history of nonobstructive CAD, thoracic aortic aneurysm s/p aortic root and valve replacement, atrial fibrillation who presents for consultation in the [facility]. The patient was initially diagnosed with atrial fibrillation in 2018 postoperatively following his aortic valve replacement. He underwent DCCV at that time. He was seen by Dr Irish Lack on 01/06/22 when the patient noted afib on his Apple Watch. He did have symptoms of decreased exercise tolerance. He was started on Eliquis for a CHADS2VASC score of 2 and had DCCV on 02/03/22. Unfortunately, he was back in afib about 6 days later and is in rate controlled afib today.   Today, he denies symptoms of palpitations, chest pain, shortness of breath, orthopnea, PND, lower extremity edema, dizziness, presyncope, syncope, snoring, daytime somnolence, bleeding, or neurologic sequela. The patient is tolerating medications without difficulties and is otherwise without complaint today.    Atrial Fibrillation Risk Factors:  he does not have symptoms or diagnosis of sleep apnea. he does not have a history of rheumatic fever. he does have a history of alcohol use.   he has a BMI of Body mass index is 21.14 kg/m.Marland Kitchen Filed Weights   02/17/22 1104  Weight: 61.2 kg    Family History  Problem Relation Age of Onset   Hypertension Mother    Mental illness Brother      Atrial Fibrillation Management history:  Previous antiarrhythmic drugs: none Previous cardioversions: 2018, 02/03/22 Previous ablations: none CHADS2VASC score: 2 Anticoagulation history: Eliquis   Past Medical History:  Diagnosis Date   Aortic valve disorder 03/06/2017    S/p AVR with Edwards pericardial tissue valve and replacement of the ascending aorta with a  32 mm Hemashield Side Arm Graft   BASAL CELL CARCINOMA, FACE 08/19/2009   Qualifier: Diagnosis of By: Oneida Alar MD, KARL     BASAL CELL CARCINOMA, FACE 08/19/2009   Qualifier: Diagnosis of  By: Oneida Alar MD, KARL     Coronary artery disease    Metatarsalgia of left foot 04/29/2014   Neck pain, bilateral 11/08/2013   PAF (paroxysmal atrial fibrillation) ([place]) 03/09/2017   RHINITIS, ALLERGIC 11/09/2006   Qualifier: Diagnosis of  By: Samara Snide     SHOULDER PAIN, RIGHT 05/06/2009   Qualifier: Diagnosis of  By: Oneida Alar MD, KARL     Thoracic ascending aortic aneurysm ([place])    Thyroid condition    Past Surgical History:  Procedure Laterality Date   AORTIC VALVE REPLACEMENT  03/06/2017   Procedure: AORTIC VALVE REPLACEMENT (AVR) using Magna Ease valve Size 52m;  Surgeon: GGrace Isaac MD;  [facility]  Service: Open Heart Surgery;;   CARDIAC CATHETERIZATION     CARDIOVERSION N/A 04/14/2017   Procedure: CARDIOVERSION;  Surgeon: MLarey Dresser MD;  [facility];  Service: Cardiovascular;  Laterality: N/A;   CARDIOVERSION N/A 02/03/2022   Procedure: CARDIOVERSION;  Surgeon: CBuford Dresser MD;  [facility];  Service: Cardiovascular;  Laterality: N/A;   EYE SURGERY Bilateral    LASIK 10 years   REPLACEMENT ASCENDING AORTA N/A 03/06/2017   Procedure: REPLACEMENT ASCENDING AORTA using 389mHemashield Graft - with Hypothermic Circulatory Arrest;  Surgeon: GeGrace IsaacMD;  [facility] Service: Open [facility] Surgery;  Laterality: N/A;   RIGHT/LEFT HEART CATH AND CORONARY ANGIOGRAPHY N/A 01/25/2017   Procedure: Right/Left  Heart Cath and Coronary Angiography;  Surgeon: Troy Sine, MD;  Location: [facility];  Service: Cardiovascular;  Laterality: N/A;   TEE WITHOUT CARDIOVERSION N/A 03/06/2017   Procedure: TRANSESOPHAGEAL ECHOCARDIOGRAM (TEE);  Surgeon: Grace Isaac, MD;  Location: [facility];  Service: Open Heart Surgery;  Laterality: N/A;   TEE  WITHOUT CARDIOVERSION N/A 04/14/2017   Procedure: TRANSESOPHAGEAL ECHOCARDIOGRAM (TEE);  Surgeon: Larey Dresser, MD;  [facility];  Service: Cardiovascular;  Laterality: N/A;    Current Outpatient Medications  Medication Sig Dispense Refill   amoxicillin (AMOXIL) 500 MG capsule Take 4 capsules by mouth 1 hour prior to any dental procedures 8 capsule PRN   apixaban (ELIQUIS) 5 MG TABS tablet Take 1 tablet (5 mg total) by mouth 2 (two) times daily. 60 tablet 6   Cholecalciferol (VITAMIN D3) 50 MCG (2000 UT) capsule Take 2,000 Units by mouth daily.     levothyroxine (SYNTHROID) 112 MCG tablet Take 1 tablet (112 mcg total) by mouth daily on an empty stomach. 90 tablet 3   Multiple Vitamins-Minerals (ICAPS AREDS 2 PO) Take 1 tablet by mouth 2 (two) times daily.     Multiple Vitamins-Minerals (MULTIVITAMIN WITH MINERALS) tablet Take 1 tablet by mouth daily.     Omega-3 1400 MG CAPS Take 1,400 mg by mouth daily.     OVER THE COUNTER MEDICATION Take 250 mg of amoxicillin by mouth daily. Calcium Nitrate     polyethylene glycol-electrolytes (NULYTELY) 420 g solution Use as directed 4000 mL 0   sildenafil (REVATIO) 20 MG tablet 1-5 tablets daily prn ed     No current facility-administered medications for this encounter.    No Known Allergies   Social History   Socioeconomic History   Marital status: Married    Spouse name: Not on file   Number of children: Not on file   Years of education: Not on file   Highest education level: Not on file  Occupational History   Not on file  Tobacco Use   Smoking status: Never   Smokeless tobacco: Never   Tobacco comments:    Never smoke 02/17/22  Vaping Use   Vaping Use: Never used  Substance and Sexual Activity   Alcohol use: Yes    Comment: 2 glasses of wine or 2 beers - 2-3 times/ week 02/17/22   Drug use: No   Sexual activity: Not Currently  Other Topics Concern   Not on file  Social History Narrative   ** Merged History  Encounter **       Social Determinants of Health   Financial Resource Strain: Not on file  Food Insecurity: Not on file  Transportation Needs: Not on file  Physical Activity: Not on file  Stress: Not on file  Social Connections: Not on file  Intimate Partner Violence: Not on file     ROS- All systems are reviewed and negative except as per the HPI above.  Physical Exam: Vitals:   02/17/22 1104  BP: 128/88  Pulse: 78  Weight: 61.2 kg  Height: '5\' 7"'$  (1.702 m)    GEN- The patient is a well appearing male, alert and oriented x 3 today.   Head- normocephalic, atraumatic Eyes-  Sclera clear, conjunctiva pink Ears- hearing intact Oropharynx- clear Neck- supple  Lungs- Clear to ausculation bilaterally, normal work of breathing Heart- irregular rate and rhythm, no murmurs, rubs or gallops  GI- soft, NT, ND, + BS Extremities- no clubbing, cyanosis, or edema MS- no significant  deformity or atrophy Skin- no rash or lesion Psych- euthymic mood, full affect Neuro- strength and sensation are intact  Wt Readings from Last 3 Encounters:  02/17/22 61.2 kg  02/03/22 61.2 kg  01/06/22 61.2 kg    EKG today demonstrates  Afib Vent. rate 78 BPM PR interval * ms QRS duration 110 ms QT/QTcB 380/433 ms  Echo 01/25/22 demonstrated   1. Left ventricular ejection fraction, by estimation, is 70 to 75%. Left  ventricular ejection fraction by PLAX is 70 %. The left ventricle has  hyperdynamic function. The left ventricle has no regional wall motion  abnormalities. There is severe asymmetric left ventricular hypertrophy of the basal-septal segment. Left ventricular diastolic function could not be evaluated.   2. Right ventricular systolic function is mildly reduced. The right  ventricular size is normal. There is normal pulmonary artery systolic  pressure. The estimated right ventricular systolic pressure is 67.3 mmHg.   3. Left atrial size was severely dilated.   4. Right atrial size  was mildly dilated.   5. The mitral valve is grossly normal. Mild mitral valve regurgitation.   6. The aortic valve has been repaired/replaced. Aortic valve  regurgitation is not visualized. There is a 25 mm Magna pericardial valve present in the aortic position. Echo findings are consistent with normal structure and function of the aortic valve  prosthesis. Aortic valve area, by VTI measures 1.51 cm. Aortic valve mean gradient measures 13.6 mmHg. Aortic valve Vmax measures 2.59 m/s.   7. Aortic dilatation noted. There is mild dilatation of the aortic root,  measuring 43 mm. There is borderline dilatation of the ascending aorta, measuring 38 mm.   8. The inferior vena cava is normal in size with greater than 50%  respiratory variability, suggesting right atrial pressure of 3 mmHg.   Comparison(s): Changes from prior study are noted. 12/03/2020: LVEF 70-75%, bioprosthetic AVR gradient 25 mmHg.   Epic records are reviewed at length today  CHA2DS2-VASc Score = 2  The patient's score is based upon: CHF History: 0 HTN History: 0 Diabetes History: 0 Stroke History: 0 Vascular Disease History: 1 Age Score: 1 Gender Score: 0       ASSESSMENT AND PLAN: 1. Persistent Atrial Fibrillation (ICD10:  I48.19) The patient's CHA2DS2-VASc score is 2, indicating a 2.2% annual risk of stroke.   S/p DCCV 02/03/22 with early return of afib. We discussed rhythm control options today. Would avoid class IC with h/o CAD and severe LVH. Not likely an ablation candidate with severely dilated LA. Amiodarone would be the best choice of AAD in setting of severe asymmetrical hypertrophy of basal-septal segment.  Will start amiodarone 200 mg BID for one month and then decrease to 200 mg daily. Will plan for repeat DCCV after amiodarone load if he does not convert chemically. Will check cmet/TSH/cbc on follow up. Continue Eliquis 5 mg BID  2. Secondary Hypercoagulable State (ICD10:  D68.69) The patient is at  significant risk for stroke/thromboembolism based upon his CHA2DS2-VASc Score of 2.  Continue Apixaban (Eliquis).   3. HCM Severe asymmetric hypertrophy of basal-septal segment noted on echo. Followed by Dr Irish Lack  4. CAD No anginal symptoms.   Follow up in the AF clinic in 2-3 weeks.    [facility] [address] [place], [place] [address] [phone] 02/17/2022 11:22 AM

## 2022-02-17 NOTE — Patient Instructions (Signed)
Start Amiodarone '200mg'$  -- take 1 tablet twice a day for one month then reduce to 1 tablet daily - take with food   Cardioversion scheduled for Monday, July 10th  - Arrive at the [facility] and go to admitting at [facility] not eat or drink anything after midnight the night prior to your procedure.  - Take all your morning medication (except diabetic medications) with a sip of water prior to arrival.  - You will not be able to drive home after your procedure.  - Do NOT miss any doses of your blood thinner - if you should miss a dose please notify our office immediately.  - If you feel as if you go back into normal rhythm prior to scheduled cardioversion, please notify our office immediately. If your procedure is canceled in the cardioversion suite you will be charged a cancellation fee.

## 2022-02-25 DIAGNOSIS — Z4802 Encounter for removal of sutures: Secondary | ICD-10-CM | POA: Diagnosis not present

## 2022-02-25 DIAGNOSIS — D485 Neoplasm of uncertain behavior of skin: Secondary | ICD-10-CM | POA: Diagnosis not present

## 2022-02-25 DIAGNOSIS — C44319 Basal cell carcinoma of skin of other parts of face: Secondary | ICD-10-CM | POA: Diagnosis not present

## 2022-03-01 ENCOUNTER — Other Ambulatory Visit: Payer: Self-pay | Admitting: Interventional Cardiology

## 2022-03-01 ENCOUNTER — Other Ambulatory Visit (HOSPITAL_COMMUNITY): Payer: Self-pay | Admitting: Physician Assistant

## 2022-03-01 ENCOUNTER — Other Ambulatory Visit (HOSPITAL_COMMUNITY): Payer: Self-pay

## 2022-03-01 DIAGNOSIS — I4819 Other persistent atrial fibrillation: Secondary | ICD-10-CM

## 2022-03-01 DIAGNOSIS — I480 Paroxysmal atrial fibrillation: Secondary | ICD-10-CM

## 2022-03-01 MED ORDER — AMIODARONE HCL 200 MG PO TABS
200.0000 mg | ORAL_TABLET | Freq: Every day | ORAL | 3 refills | Status: DC
  Filled 2022-03-01: qty 30, 30d supply, fill #0

## 2022-03-01 MED ORDER — APIXABAN 5 MG PO TABS
5.0000 mg | ORAL_TABLET | Freq: Two times a day (BID) | ORAL | 6 refills | Status: DC
  Filled 2022-03-01: qty 60, 30d supply, fill #0
  Filled 2022-03-03: qty 180, 90d supply, fill #0

## 2022-03-01 NOTE — Telephone Encounter (Signed)
Eliquis '5mg'$  refill request received. Patient is 71 years old, weight-61.2kg, Crea-0.70 on 02/03/2022, Diagnosis-Afib, and last seen by Clint PA on 02/17/2022. Dose is appropriate based on dosing criteria. Will send in refill to requested pharmacy.

## 2022-03-04 ENCOUNTER — Other Ambulatory Visit: Payer: Self-pay | Admitting: Pharmacist

## 2022-03-04 ENCOUNTER — Other Ambulatory Visit: Payer: Self-pay | Admitting: *Deleted

## 2022-03-04 ENCOUNTER — Other Ambulatory Visit (HOSPITAL_COMMUNITY): Payer: Self-pay

## 2022-03-04 DIAGNOSIS — I480 Paroxysmal atrial fibrillation: Secondary | ICD-10-CM

## 2022-03-04 MED ORDER — APIXABAN 5 MG PO TABS
5.0000 mg | ORAL_TABLET | Freq: Two times a day (BID) | ORAL | 1 refills | Status: DC
  Filled 2022-03-04: qty 180, 90d supply, fill #0
  Filled 2022-05-20: qty 180, 90d supply, fill #1

## 2022-03-04 MED ORDER — AMIODARONE HCL 200 MG PO TABS
200.0000 mg | ORAL_TABLET | Freq: Every day | ORAL | 1 refills | Status: DC
  Filled 2022-03-04 – 2022-03-16 (×3): qty 90, 90d supply, fill #0

## 2022-03-07 ENCOUNTER — Ambulatory Visit (HOSPITAL_COMMUNITY)
Admission: RE | Admit: 2022-03-07 | Discharge: 2022-03-07 | Disposition: A | Discharge status: Home / Self Care | Payer: PPO | Source: Ambulatory Visit | Attending: Physician Assistant | Admitting: Physician Assistant

## 2022-03-07 VITALS — BP 136/98 | HR 53 | Ht 67.0 in | Wt 134.8 lb

## 2022-03-07 DIAGNOSIS — Z79899 Other long term (current) drug therapy: Secondary | ICD-10-CM | POA: Diagnosis not present

## 2022-03-07 DIAGNOSIS — Z7901 Long term (current) use of anticoagulants: Secondary | ICD-10-CM | POA: Insufficient documentation

## 2022-03-07 DIAGNOSIS — F1091 Alcohol use, unspecified, in remission: Secondary | ICD-10-CM | POA: Insufficient documentation

## 2022-03-07 DIAGNOSIS — I4819 Other persistent atrial fibrillation: Secondary | ICD-10-CM | POA: Diagnosis not present

## 2022-03-07 DIAGNOSIS — I422 Other hypertrophic cardiomyopathy: Secondary | ICD-10-CM | POA: Diagnosis not present

## 2022-03-07 DIAGNOSIS — I712 Thoracic aortic aneurysm, without rupture, unspecified: Secondary | ICD-10-CM | POA: Insufficient documentation

## 2022-03-07 DIAGNOSIS — I2510 Atherosclerotic heart disease of native coronary artery without angina pectoris: Secondary | ICD-10-CM | POA: Diagnosis not present

## 2022-03-07 DIAGNOSIS — D6869 Other thrombophilia: Secondary | ICD-10-CM | POA: Insufficient documentation

## 2022-03-07 LAB — TSH: TSH: 5.441 u[IU]/mL — ABNORMAL HIGH (ref 0.350–4.500)

## 2022-03-07 LAB — COMPREHENSIVE METABOLIC PANEL
ALT: 25 U/L (ref 0–44)
AST: 31 U/L (ref 15–41)
Albumin: 4.2 g/dL (ref 3.5–5.0)
Alkaline Phosphatase: 47 U/L (ref 38–126)
Anion gap: 4 — ABNORMAL LOW (ref 5–15)
BUN: 31 mg/dL — ABNORMAL HIGH (ref 8–23)
CO2: 29 mmol/L (ref 22–32)
Calcium: 9.3 mg/dL (ref 8.9–10.3)
Chloride: 108 mmol/L (ref 98–111)
Creatinine, Ser: 0.98 mg/dL (ref 0.61–1.24)
GFR, Estimated: >60 mL/min (ref >60)
Glucose, Bld: 94 mg/dL (ref 70–99)
Potassium: 4.8 mmol/L (ref 3.5–5.1)
Sodium: 141 mmol/L (ref 135–145)
Total Bilirubin: 1.6 mg/dL — ABNORMAL HIGH (ref 0.3–1.2)
Total Protein: 6.6 g/dL (ref 6.5–8.1)

## 2022-03-07 LAB — CBC
HCT: 45.9 % (ref 39.0–52.0)
Hemoglobin: 15.3 g/dL (ref 13.0–17.0)
MCH: 33 pg (ref 26.0–34.0)
MCHC: 33.3 g/dL (ref 30.0–36.0)
MCV: 98.9 fL (ref 80.0–100.0)
Platelets: 150 10*3/uL (ref 150–400)
RBC: 4.64 MIL/uL (ref 4.22–5.81)
RDW: 12.9 % (ref 11.5–15.5)
WBC: 5.5 10*3/uL (ref 4.0–10.5)
nRBC: 0 % (ref 0.0–0.2)

## 2022-03-07 NOTE — H&P (View-Only) (Signed)
Primary Care Physician: Kathyrn Lass, MD Primary Cardiologist: Dr Irish Lack  Primary Electrophysiologist: none Referring Physician: Dr Antony Madura is a 71 y.o. 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 The patient was initially diagnosed with atrial fibrillation in 2018 postoperatively following his aortic valve replacement. He underwent DCCV at that time. He was seen by Dr Irish Lack on 01/06/22 when the patient noted afib on his Apple Watch. He did have symptoms of decreased exercise tolerance. He was started on Eliquis for a CHADS2VASC score of 2 and had DCCV on 02/03/22. Unfortunately, he was back in afib about 6 days later. Started on amiodarone 02/17/22.  On follow up today, patient reports that he has done reasonably well since his last visit. His resting heart rate is closer to his baseline since starting amiodarone but he does still note decreased exercise tolerance. No bleeding issues on anticoagulation.   Today, he denies symptoms of palpitations, chest pain, shortness of breath, orthopnea, PND, lower extremity edema, dizziness, presyncope, syncope, snoring, daytime somnolence, bleeding, or neurologic sequela. The patient is tolerating medications without difficulties and is otherwise without complaint today.    Atrial Fibrillation Risk Factors:  he does not have symptoms or diagnosis of sleep apnea. he does not have a history of rheumatic fever. he does have a history of alcohol use.   he has a BMI of Body mass index is 21.11 kg/m.Marland Kitchen Filed Weights   03/07/22 1435  Weight: 61.1 kg     Family History  Problem Relation Age of Onset   Hypertension Mother    Mental illness Brother      Atrial Fibrillation Management history:  Previous antiarrhythmic drugs: amiodarone  Previous cardioversions: 2018,  02/03/22 Previous ablations: none CHADS2VASC score: 2 Anticoagulation history: Eliquis   Past Medical History:  Diagnosis Date   Aortic valve disorder 03/06/2017    S/p AVR with Edwards pericardial tissue valve and replacement of the ascending aorta with a 32 mm Hemashield Side Arm Graft   BASAL CELL CARCINOMA, FACE 08/19/2009   Qualifier: Diagnosis of By: Oneida Alar MD, KARL     BASAL CELL CARCINOMA, FACE 08/19/2009   Qualifier: Diagnosis of  By: Oneida Alar MD, KARL     Coronary artery disease    Metatarsalgia of left foot 04/29/2014   Neck pain, bilateral 11/08/2013   PAF (paroxysmal atrial fibrillation) ([place]) 03/09/2017   RHINITIS, ALLERGIC 11/09/2006   Qualifier: Diagnosis of  By: Samara Snide     SHOULDER PAIN, RIGHT 05/06/2009   Qualifier: Diagnosis of  By: Oneida Alar MD, KARL     Thoracic ascending aortic aneurysm ([place])    Thyroid condition    Past Surgical History:  Procedure Laterality Date   AORTIC VALVE REPLACEMENT  03/06/2017   Procedure: AORTIC VALVE REPLACEMENT (AVR) using Magna Ease valve Size 52m;  Surgeon: GGrace Isaac MD;  [facility]  Service: Open Heart Surgery;;   CARDIAC CATHETERIZATION     CARDIOVERSION N/A 04/14/2017   Procedure: CARDIOVERSION;  Surgeon: MLarey Dresser MD;  [facility];  Service: Cardiovascular;  Laterality: N/A;   CARDIOVERSION N/A 02/03/2022   Procedure: CARDIOVERSION;  Surgeon: CBuford Dresser MD;  [facility];  Service: Cardiovascular;  Laterality: N/A;   EYE SURGERY Bilateral    LASIK 10 years   REPLACEMENT ASCENDING AORTA N/A 03/06/2017   Procedure: REPLACEMENT ASCENDING  AORTA using 8m Hemashield Graft - with Hypothermic Circulatory Arrest;  Surgeon: GGrace Isaac MD;  [place]  Service: Open Heart Surgery;  Laterality: N/A;   RIGHT/LEFT HEART CATH AND CORONARY ANGIOGRAPHY N/A 01/25/2017   Procedure: Right/Left Heart Cath and Coronary Angiography;  Surgeon: KTroy Sine MD;  [facility];  Service: Cardiovascular;  Laterality: N/A;   TEE WITHOUT CARDIOVERSION N/A 03/06/2017   Procedure: TRANSESOPHAGEAL ECHOCARDIOGRAM (TEE);  Surgeon: GGrace Isaac MD;  [facility]  Service: Open Heart Surgery;  Laterality: N/A;   TEE WITHOUT CARDIOVERSION N/A 04/14/2017   Procedure: TRANSESOPHAGEAL ECHOCARDIOGRAM (TEE);  Surgeon: MLarey Dresser MD;  [facility];  Service: Cardiovascular;  Laterality: N/A;    Current Outpatient Medications  Medication Sig Dispense Refill   amiodarone (PACERONE) 200 MG tablet Take 1 tablet (200 mg total) by mouth daily. 90 tablet 1   amoxicillin (AMOXIL) 500 MG capsule Take 4 capsules by mouth 1 hour prior to any dental procedures 8 capsule PRN   apixaban (ELIQUIS) 5 MG TABS tablet Take 1 tablet (5 mg total) by mouth 2 (two) times daily. 180 tablet 1   Calcium Citrate 250 MG TABS Take 1 tablet by mouth every morning.     Cholecalciferol (VITAMIN D3) 50 MCG (2000 UT) capsule Take 2,000 Units by mouth daily.     levothyroxine (SYNTHROID) 112 MCG tablet Take 1 tablet (112 mcg total) by mouth daily on an empty stomach. 90 tablet 3   Multiple Vitamins-Minerals (ICAPS AREDS 2 PO) Take 1 tablet by mouth 2 (two) times daily.     Multiple Vitamins-Minerals (MULTIVITAMIN WITH MINERALS) tablet Take 1 tablet by mouth daily.     Omega-3 1400 MG CAPS Take 1,400 mg by mouth daily.     sildenafil (REVATIO) 20 MG tablet 1-5 tablets daily prn ed     No current facility-administered medications for this encounter.    No Known Allergies   Social History   Socioeconomic History   Marital status: Married    Spouse name: Not on file   Number of children: Not on file   Years of education: Not on file   Highest education level: Not on file  Occupational History   Not on file  Tobacco Use   Smoking status: Never   Smokeless tobacco: Never   Tobacco comments:    Never smoke 02/17/22  Vaping Use   Vaping Use: Never used  Substance  and Sexual Activity   Alcohol use: Yes    Comment: 2 glasses of wine or 2 beers - 2-3 times/ week 02/17/22   Drug use: No   Sexual activity: Not Currently  Other Topics Concern   Not on file  Social History Narrative   ** Merged History Encounter **       Social Determinants of Health   Financial Resource Strain: Low Risk  (07/24/2019)   Overall Financial Resource Strain (CARDIA)    Difficulty of Paying Living Expenses: Not hard at all  Food Insecurity: No Food Insecurity (07/24/2019)   Hunger Vital Sign    Worried About Running Out of Food in the Last Year: Never true    RFort Atkinsonin the Last Year: Never true  Transportation Needs: No Transportation Needs (07/24/2019)   PRAPARE - THydrologist(Medical): No    Lack of Transportation (Non-Medical): No  Physical Activity: Sufficiently Active (07/24/2019)   Exercise Vital Sign    Days  of Exercise per Week: 4 days    Minutes of Exercise per Session: 40 min  Stress: No Stress Concern Present (07/24/2019)   [place]    Feeling of Stress : Only a little  Social Connections: Unknown (07/24/2019)   Social Connection and Isolation Panel [NHANES]    Frequency of Communication with Friends and Family: Three times a week    Frequency of Social Gatherings with Friends and Family: Twice a week    Attends Religious Services: Patient refused    Active Member of Clubs or Organizations: Patient refused    Attends Archivist Meetings: Patient refused    Marital Status: Married  Human resources officer Violence: Not At Risk (07/24/2019)   Humiliation, Afraid, Rape, and Kick questionnaire    Fear of Current or Ex-Partner: No    Emotionally Abused: No    Physically Abused: No    Sexually Abused: No     ROS- All systems are reviewed and negative except as per the HPI above.  Physical Exam: Vitals:   03/07/22 1435  BP: (!) 136/98   Pulse: (!) 53  Weight: 61.1 kg  Height: '5\' 7"'$  (1.702 m)     GEN- The patient is a well appearing male, alert and oriented x 3 today.   HEENT-head normocephalic, atraumatic, sclera clear, conjunctiva pink, hearing intact, trachea midline. Lungs- Clear to ausculation bilaterally, normal work of breathing Heart- irregular rate and rhythm, no rubs or gallops, 3/6 systolic murmur   GI- soft, NT, ND, + BS Extremities- no clubbing, cyanosis, or edema MS- no significant deformity or atrophy Skin- no rash or lesion Psych- euthymic mood, full affect Neuro- strength and sensation are intact   Wt Readings from Last 3 Encounters:  03/07/22 61.1 kg  02/17/22 61.2 kg  02/03/22 61.2 kg    EKG today demonstrates  Afib Vent. rate 53 BPM PR interval * ms QRS duration 108 ms QT/QTcB 424/397 ms  Echo 01/25/22 demonstrated   1. Left ventricular ejection fraction, by estimation, is 70 to 75%. Left  ventricular ejection fraction by PLAX is 70 %. The left ventricle has  hyperdynamic function. The left ventricle has no regional wall motion  abnormalities. There is severe asymmetric left ventricular hypertrophy of the basal-septal segment. Left ventricular diastolic function could not be evaluated.   2. Right ventricular systolic function is mildly reduced. The right  ventricular size is normal. There is normal pulmonary artery systolic  pressure. The estimated right ventricular systolic pressure is 70.2 mmHg.   3. Left atrial size was severely dilated.   4. Right atrial size was mildly dilated.   5. The mitral valve is grossly normal. Mild mitral valve regurgitation.   6. The aortic valve has been repaired/replaced. Aortic valve  regurgitation is not visualized. There is a 25 mm Magna pericardial valve present in the aortic position. Echo findings are consistent with normal structure and function of the aortic valve  prosthesis. Aortic valve area, by VTI measures 1.51 cm. Aortic valve mean  gradient measures 13.6 mmHg. Aortic valve Vmax measures 2.59 m/s.   7. Aortic dilatation noted. There is mild dilatation of the aortic root,  measuring 43 mm. There is borderline dilatation of the ascending aorta, measuring 38 mm.   8. The inferior vena cava is normal in size with greater than 50%  respiratory variability, suggesting right atrial pressure of 3 mmHg.   Comparison(s): Changes from prior study are noted. 12/03/2020: LVEF 70-75%, bioprosthetic AVR  gradient 25 mmHg.   Epic records are reviewed at length today  CHA2DS2-VASc Score = 2  The patient's score is based upon: CHF History: 0 HTN History: 0 Diabetes History: 0 Stroke History: 0 Vascular Disease History: 1 Age Score: 1 Gender Score: 0       ASSESSMENT AND PLAN: 1. Persistent Atrial Fibrillation (ICD10:  I48.19) The patient's CHA2DS2-VASc score is 2, indicating a 2.2% annual risk of stroke.   He remains in rate controlled afib, scheduled for DCCV on 03/21/22. Continue amiodarone 200 mg BID, decrease to once daily 03/19/22.  Check cmet/TSH/cbc today. Continue Eliquis 5 mg BID  2. Secondary Hypercoagulable State (ICD10:  D68.69) The patient is at significant risk for stroke/thromboembolism based upon his CHA2DS2-VASc Score of 2.  Continue Apixaban (Eliquis).   3. HCM Severe asymmetric hypertrophy of basal-septal segment noted on echo. Followed by Dr Irish Lack  4. CAD No anginal symptoms.   Follow up in the AF clinic post DCCV. Will also refer to establish care with EP.   [facility] [address] [place], [place] [address] [phone] 03/07/2022 3:39 PM

## 2022-03-09 ENCOUNTER — Encounter (HOSPITAL_COMMUNITY): Payer: Self-pay | Admitting: Cardiovascular Disease

## 2022-03-11 ENCOUNTER — Other Ambulatory Visit (HOSPITAL_COMMUNITY): Payer: Self-pay

## 2022-03-11 DIAGNOSIS — M9902 Segmental and somatic dysfunction of thoracic region: Secondary | ICD-10-CM | POA: Diagnosis not present

## 2022-03-11 DIAGNOSIS — M9901 Segmental and somatic dysfunction of cervical region: Secondary | ICD-10-CM | POA: Diagnosis not present

## 2022-03-11 DIAGNOSIS — M9904 Segmental and somatic dysfunction of sacral region: Secondary | ICD-10-CM | POA: Diagnosis not present

## 2022-03-11 MED ORDER — LEVOTHYROXINE SODIUM 125 MCG PO TABS
125.0000 ug | ORAL_TABLET | Freq: Every morning | ORAL | 1 refills | Status: DC
  Filled 2022-03-11: qty 90, 90d supply, fill #0
  Filled 2022-05-20: qty 90, 90d supply, fill #1

## 2022-03-16 ENCOUNTER — Other Ambulatory Visit (HOSPITAL_COMMUNITY): Payer: Self-pay

## 2022-03-21 ENCOUNTER — Ambulatory Visit (HOSPITAL_COMMUNITY)
Admission: RE | Admit: 2022-03-21 | Discharge: 2022-03-21 | Disposition: A | Discharge status: Home / Self Care | Payer: PPO | Attending: Cardiovascular Disease | Admitting: Cardiovascular Disease

## 2022-03-21 ENCOUNTER — Encounter (HOSPITAL_COMMUNITY): Payer: Self-pay | Admitting: Cardiovascular Disease

## 2022-03-21 ENCOUNTER — Ambulatory Visit (HOSPITAL_BASED_OUTPATIENT_CLINIC_OR_DEPARTMENT_OTHER): Payer: PPO | Admitting: Anesthesiology

## 2022-03-21 ENCOUNTER — Other Ambulatory Visit: Payer: Self-pay

## 2022-03-21 ENCOUNTER — Encounter (HOSPITAL_COMMUNITY)
Admission: RE | Disposition: A | Discharge status: Home / Self Care | Payer: PPO | Source: Home / Self Care | Attending: Cardiovascular Disease

## 2022-03-21 ENCOUNTER — Ambulatory Visit (HOSPITAL_COMMUNITY): Payer: PPO | Admitting: Anesthesiology

## 2022-03-21 DIAGNOSIS — I4819 Other persistent atrial fibrillation: Secondary | ICD-10-CM | POA: Diagnosis not present

## 2022-03-21 DIAGNOSIS — Z952 Presence of prosthetic heart valve: Secondary | ICD-10-CM | POA: Insufficient documentation

## 2022-03-21 DIAGNOSIS — I4891 Unspecified atrial fibrillation: Secondary | ICD-10-CM

## 2022-03-21 DIAGNOSIS — I2510 Atherosclerotic heart disease of native coronary artery without angina pectoris: Secondary | ICD-10-CM | POA: Diagnosis not present

## 2022-03-21 DIAGNOSIS — Z7901 Long term (current) use of anticoagulants: Secondary | ICD-10-CM | POA: Diagnosis not present

## 2022-03-21 DIAGNOSIS — M5412 Radiculopathy, cervical region: Secondary | ICD-10-CM

## 2022-03-21 DIAGNOSIS — I7121 Aneurysm of the ascending aorta, without rupture: Secondary | ICD-10-CM | POA: Diagnosis not present

## 2022-03-21 DIAGNOSIS — I422 Other hypertrophic cardiomyopathy: Secondary | ICD-10-CM | POA: Diagnosis not present

## 2022-03-21 DIAGNOSIS — D6869 Other thrombophilia: Secondary | ICD-10-CM | POA: Diagnosis not present

## 2022-03-21 HISTORY — PX: CARDIOVERSION: SHX1299

## 2022-03-21 SURGERY — CARDIOVERSION
Anesthesia: General

## 2022-03-21 MED ORDER — LIDOCAINE HCL (CARDIAC) PF 100 MG/5ML IV SOSY
PREFILLED_SYRINGE | INTRAVENOUS | Status: DC | PRN
  Administered 2022-03-21: 100 mg via INTRAVENOUS

## 2022-03-21 MED ORDER — PROPOFOL 10 MG/ML IV BOLUS
INTRAVENOUS | Status: DC | PRN
  Administered 2022-03-21: 90 mg via INTRAVENOUS

## 2022-03-21 MED ORDER — SODIUM CHLORIDE 0.9 % IV SOLN: INTRAVENOUS | Status: DC

## 2022-03-21 NOTE — Interval H&P Note (Signed)
History and Physical Interval Note:  03/21/2022 10:17 AM  James Mayo  has presented today for surgery, with the diagnosis of AFIB.  The various methods of treatment have been discussed with the patient and family. After consideration of risks, benefits and other options for treatment, the patient has consented to  Procedure(s): CARDIOVERSION (N/A) as a surgical intervention.  The patient's history has been reviewed, patient examined, no change in status, stable for surgery.  I have reviewed the patient's chart and labs.  Questions were answered to the patient's satisfaction.    NPO for DCCV. On eliquis >3 weeks. NPO.  Lake Bells T. Audie Box, MD, [facility]  [address] [place], [place] [address] [phone]  10:18 AM

## 2022-03-21 NOTE — Transfer of Care (Signed)
Immediate Anesthesia Transfer of Care Note  Patient: James Mayo  Procedure(s) Performed: CARDIOVERSION  Patient Location: PACU  Anesthesia Type:General  Level of Consciousness: drowsy  Airway & Oxygen Therapy: Patient Spontanous Breathing  Post-op Assessment: Report given to RN and Post -op Vital signs reviewed and stable  Post vital signs: Reviewed and stable  Last Vitals:  Vitals Value Taken Time  BP    Temp    Pulse 40 03/21/22 1039  Resp 11 03/21/22 1039  SpO2 99 % 03/21/22 1039  Vitals shown include unvalidated device data.  Last Pain:  Vitals:   03/21/22 0900  TempSrc: Temporal  PainSc: 0-No pain         Complications: No notable events documented.

## 2022-03-21 NOTE — CV Procedure (Signed)
   DIRECT CURRENT CARDIOVERSION  NAME:  James Mayo    MRN: [id number] DOB:  10-25-1950    ADMIT DATE: 03/21/2022  Indication:  Symptomatic atrial fibrillation  Procedure Note:  The patient signed informed consent.  They have had had therapeutic anticoagulation with eliquis greater than 3 weeks.  Anesthesia was administered by Dr. Tobias Alexander.  Adequate airway was maintained throughout and vital followed per protocol.  They were cardioverted x 1 with 200J of biphasic synchronized energy.  They converted to NSR.  There were no apparent complications.  The patient had normal neuro status and respiratory status post procedure with vitals stable as recorded elsewhere.    Follow up: They will continue on current medical therapy and follow up with cardiology as scheduled.  [place] Bells T. Audie Box, MD, [facility]  [address] [place], [place] [address] [phone]  10:35 AM

## 2022-03-21 NOTE — Discharge Instructions (Signed)

## 2022-03-21 NOTE — Anesthesia Postprocedure Evaluation (Signed)
Anesthesia Post Note  Patient: James Mayo  Procedure(s) Performed: CARDIOVERSION     Patient location during evaluation: Endoscopy Anesthesia Type: General Level of consciousness: sedated Pain management: pain level controlled Vital Signs Assessment: post-procedure vital signs reviewed and stable Respiratory status: spontaneous breathing and respiratory function stable Cardiovascular status: stable Postop Assessment: no apparent nausea or vomiting Anesthetic complications: no   No notable events documented.  Last Vitals:  Vitals:   03/21/22 1100 03/21/22 1105  BP: 125/83 127/79  Pulse: (!) 43 (!) 44  Resp: 19 17  Temp:    SpO2: 96% 98%    Last Pain:  Vitals:   03/21/22 1105  TempSrc:   PainSc: 0-No pain                 Kameren Baade DANIEL

## 2022-03-21 NOTE — Anesthesia Preprocedure Evaluation (Addendum)
Anesthesia Evaluation  Patient identified by MRN, date of birth, ID band Patient awake    Reviewed: Allergy & Precautions, NPO status , Patient's Chart, lab work & pertinent test results  History of Anesthesia Complications Negative for: history of anesthetic complications  Airway Mallampati: II  TM Distance: >3 FB Neck ROM: Full    Dental no notable dental hx. (+) Dental Advisory Given   Pulmonary neg pulmonary ROS,    breath sounds clear to auscultation       Cardiovascular + CAD  + dysrhythmias Atrial Fibrillation + Valvular Problems/Murmurs (s/p AVR in 2018)  Rhythm:Irregular Rate:Normal + Systolic murmurs Thoracic aortic aneurysm S/P AVR Echo 01/2022: IMPRESSIONS    1. Left ventricular ejection fraction, by estimation, is 70 to 75%. Left  ventricular ejection fraction by PLAX is 70 %. The left ventricle has  hyperdynamic function. The left ventricle has no regional wall motion  abnormalities. There is severe  asymmetric left ventricular hypertrophy of the basal-septal segment. Left  ventricular diastolic function could not be evaluated.  2. Right ventricular systolic function is mildly reduced. The right  ventricular size is normal. There is normal pulmonary artery systolic  pressure. The estimated right ventricular systolic pressure is 24.2 mmHg.  3. Left atrial size was severely dilated.  4. Right atrial size was mildly dilated.  5. The mitral valve is grossly normal. Mild mitral valve regurgitation.  6. The aortic valve has been repaired/replaced. Aortic valve  regurgitation is not visualized. There is a 25 mm Magna pericardial valve  present in the aortic position. Echo findings are consistent with normal  structure and function of the aortic valve  prosthesis. Aortic valve area, by VTI measures 1.51 cm. Aortic valve mean  gradient measures 13.6 mmHg. Aortic valve Vmax measures 2.59 m/s.  7. Aortic  dilatation noted. There is mild dilatation of the aortic root,  measuring 43 mm. There is borderline dilatation of the ascending aorta,  measuring 38 mm.  8. The inferior vena cava is normal in size with greater than 50%  respiratory variability, suggesting right atrial pressure of 3 mmHg.    Neuro/Psych  Neuromuscular disease (cervical radiculopathy) negative psych ROS   GI/Hepatic negative GI ROS, Neg liver ROS,   Endo/Other  negative endocrine ROS  Renal/GU negative Renal ROS  negative genitourinary   Musculoskeletal negative musculoskeletal ROS (+)   Abdominal   Peds negative pediatric ROS (+)  Hematology negative hematology ROS (+)   Anesthesia Other Findings   Reproductive/Obstetrics negative OB ROS                            Anesthesia Physical  Anesthesia Plan  ASA: 3  Anesthesia Plan: General   Post-op Pain Management:    Induction: Intravenous  PONV Risk Score and Plan: 2 and Treatment may vary due to age or medical condition, TIVA and Propofol infusion  Airway Management Planned: Mask  Additional Equipment: None  Intra-op Plan:   Post-operative Plan:   Informed Consent: I have reviewed the patients History and Physical, chart, labs and discussed the procedure including the risks, benefits and alternatives for the proposed anesthesia with the patient or authorized representative who has indicated his/her understanding and acceptance.     Dental advisory given  Plan Discussed with: Anesthesiologist and CRNA  Anesthesia Plan Comments:        Anesthesia Quick Evaluation

## 2022-03-22 ENCOUNTER — Encounter (HOSPITAL_COMMUNITY): Payer: Self-pay | Admitting: Cardiovascular Disease

## 2022-03-24 ENCOUNTER — Other Ambulatory Visit (HOSPITAL_COMMUNITY): Payer: Self-pay

## 2022-03-28 ENCOUNTER — Ambulatory Visit (HOSPITAL_COMMUNITY): Payer: PPO | Admitting: Physician Assistant

## 2022-03-28 ENCOUNTER — Telehealth (HOSPITAL_COMMUNITY): Payer: Self-pay

## 2022-03-28 NOTE — Telephone Encounter (Signed)
Patient called in on Friday to move his appointment from 7/17 to 7/18 at 9:30am with Adline Peals PA.

## 2022-03-29 ENCOUNTER — Encounter (HOSPITAL_COMMUNITY): Payer: Self-pay | Admitting: Physician Assistant

## 2022-03-29 ENCOUNTER — Ambulatory Visit (HOSPITAL_COMMUNITY)
Admission: RE | Admit: 2022-03-29 | Discharge: 2022-03-29 | Disposition: A | Discharge status: Home / Self Care | Payer: PPO | Source: Ambulatory Visit | Attending: Physician Assistant | Admitting: Physician Assistant

## 2022-03-29 VITALS — BP 124/80 | HR 45 | Ht 67.0 in | Wt 133.2 lb

## 2022-03-29 DIAGNOSIS — I2510 Atherosclerotic heart disease of native coronary artery without angina pectoris: Secondary | ICD-10-CM | POA: Diagnosis not present

## 2022-03-29 DIAGNOSIS — I517 Cardiomegaly: Secondary | ICD-10-CM | POA: Diagnosis not present

## 2022-03-29 DIAGNOSIS — Z7901 Long term (current) use of anticoagulants: Secondary | ICD-10-CM | POA: Insufficient documentation

## 2022-03-29 DIAGNOSIS — I4819 Other persistent atrial fibrillation: Secondary | ICD-10-CM | POA: Diagnosis not present

## 2022-03-29 DIAGNOSIS — D6869 Other thrombophilia: Secondary | ICD-10-CM

## 2022-03-29 DIAGNOSIS — I712 Thoracic aortic aneurysm, without rupture, unspecified: Secondary | ICD-10-CM | POA: Insufficient documentation

## 2022-03-29 NOTE — Progress Notes (Signed)
Primary Care Physician: Kathyrn Lass, MD Primary Cardiologist: Dr Irish Lack  Primary Electrophysiologist: Dr Curt Bears (new) Referring Physician: Dr Antony Madura is a 71 y.o. 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 The patient was initially diagnosed with atrial fibrillation in 2018 postoperatively following his aortic valve replacement. He underwent DCCV at that time. He was seen by Dr Irish Lack on 01/06/22 when the patient noted afib on his Apple Watch. He did have symptoms of decreased exercise tolerance. He was started on Eliquis for a CHADS2VASC score of 2 and had DCCV on 02/03/22. Unfortunately, he was back in afib about 6 days later. Started on amiodarone 02/17/22.  On follow up today, patient is s/p DCCV on 03/21/22. He is in SR today. He does report that his exercise tolerance has improved since DCCV. No bleeding issues on anticoagulation.    Today, he denies symptoms of palpitations, chest pain, shortness of breath, orthopnea, PND, lower extremity edema, dizziness, presyncope, syncope, snoring, daytime somnolence, bleeding, or neurologic sequela. The patient is tolerating medications without difficulties and is otherwise without complaint today.    Atrial Fibrillation Risk Factors:  he does not have symptoms or diagnosis of sleep apnea. he does not have a history of rheumatic fever. he does have a history of alcohol use.   he has a BMI of Body mass index is 20.86 kg/m.Marland Kitchen Filed Weights   03/29/22 0930  Weight: 60.4 kg    Family History  Problem Relation Age of Onset   Hypertension Mother    Mental illness Brother      Atrial Fibrillation Management history:  Previous antiarrhythmic drugs: amiodarone  Previous cardioversions: 2018, 02/03/22, 03/21/22 Previous ablations: none CHADS2VASC score:  2 Anticoagulation history: Eliquis   Past Medical History:  Diagnosis Date   Aortic valve disorder 03/06/2017    S/p AVR with Edwards pericardial tissue valve and replacement of the ascending aorta with a 32 mm Hemashield Side Arm Graft   BASAL CELL CARCINOMA, FACE 08/19/2009   Qualifier: Diagnosis of By: Oneida Alar MD, KARL     BASAL CELL CARCINOMA, FACE 08/19/2009   Qualifier: Diagnosis of  By: Oneida Alar MD, KARL     Coronary artery disease    Metatarsalgia of left foot 04/29/2014   Neck pain, bilateral 11/08/2013   PAF (paroxysmal atrial fibrillation) ([place]) 03/09/2017   RHINITIS, ALLERGIC 11/09/2006   Qualifier: Diagnosis of  By: Samara Snide     SHOULDER PAIN, RIGHT 05/06/2009   Qualifier: Diagnosis of  By: Oneida Alar MD, KARL     Thoracic ascending aortic aneurysm ([place])    Thyroid condition    Past Surgical History:  Procedure Laterality Date   AORTIC VALVE REPLACEMENT  03/06/2017   Procedure: AORTIC VALVE REPLACEMENT (AVR) using Magna Ease valve Size 19m;  Surgeon: GGrace Isaac MD;  [place] [facility]  Service: Open Heart Surgery;;   CARDIAC CATHETERIZATION     CARDIOVERSION N/A 04/14/2017   Procedure: CARDIOVERSION;  Surgeon: MLarey Dresser MD;  [facility]  Service: Cardiovascular;  Laterality: N/A;   CARDIOVERSION N/A 02/03/2022   Procedure: CARDIOVERSION;  Surgeon: CBuford Dresser MD;  [facility]  Service: Cardiovascular;  Laterality: N/A;   CARDIOVERSION N/A 03/21/2022   Procedure: CARDIOVERSION;  Surgeon: OGeralynn Rile MD;  [place] [facility]  Service: Cardiovascular;  Laterality: N/A;   EYE SURGERY Bilateral  LASIK 10 years   REPLACEMENT ASCENDING AORTA N/A 03/06/2017   Procedure: REPLACEMENT ASCENDING AORTA using 13m Hemashield Graft - with Hypothermic Circulatory Arrest;  Surgeon: GGrace Isaac MD;  [place] [facility]  Service: Open Heart Surgery;  Laterality: N/A;   RIGHT/LEFT HEART CATH AND CORONARY ANGIOGRAPHY N/A  01/25/2017   Procedure: Right/Left Heart Cath and Coronary Angiography;  Surgeon: KTroy Sine MD;  [facility];  Service: Cardiovascular;  Laterality: N/A;   TEE WITHOUT CARDIOVERSION N/A 03/06/2017   Procedure: TRANSESOPHAGEAL ECHOCARDIOGRAM (TEE);  Surgeon: GGrace Isaac MD;  [place] [facility]  Service: Open Heart Surgery;  Laterality: N/A;   TEE WITHOUT CARDIOVERSION N/A 04/14/2017   Procedure: TRANSESOPHAGEAL ECHOCARDIOGRAM (TEE);  Surgeon: MLarey Dresser MD;  [facility];  Service: Cardiovascular;  Laterality: N/A;    Current Outpatient Medications  Medication Sig Dispense Refill   amiodarone (PACERONE) 200 MG tablet Take 1 tablet (200 mg total) by mouth daily. 90 tablet 1   amoxicillin (AMOXIL) 500 MG capsule Take 4 capsules by mouth 1 hour prior to any dental procedures 8 capsule PRN   apixaban (ELIQUIS) 5 MG TABS tablet Take 1 tablet (5 mg total) by mouth 2 (two) times daily. 180 tablet 1   Calcium Citrate 250 MG TABS Take 1 tablet by mouth every morning.     Cholecalciferol (VITAMIN D3) 50 MCG (2000 UT) capsule Take 2,000 Units by mouth daily.     levothyroxine (SYNTHROID) 125 MCG tablet Take 1 tablet (125 mcg total) by mouth in the morning on an empty stomach 90 tablet 1   Multiple Vitamins-Minerals (ICAPS AREDS 2 PO) Take 1 tablet by mouth 2 (two) times daily.     Multiple Vitamins-Minerals (MULTIVITAMIN WITH MINERALS) tablet Take 1 tablet by mouth daily.     Omega-3 1400 MG CAPS Take 1,400 mg by mouth daily.     sildenafil (REVATIO) 20 MG tablet 1-5 tablets daily prn ed     No current facility-administered medications for this encounter.    No Known Allergies   Social History   Socioeconomic History   Marital status: Married    Spouse name: Not on file   Number of children: Not on file   Years of education: Not on file   Highest education level: Not on file  Occupational History   Not on file  Tobacco Use   Smoking status: Never    Smokeless tobacco: Never   Tobacco comments:    Never smoke 02/17/22  Vaping Use   Vaping Use: Never used  Substance and Sexual Activity   Alcohol use: Yes    Comment: 2 glasses of wine or 2 beers - 2-3 times/ week 02/17/22   Drug use: No   Sexual activity: Not Currently  Other Topics Concern   Not on file  Social History Narrative   ** Merged History Encounter **       Social Determinants of Health   Financial Resource Strain: Low Risk  (07/24/2019)   Overall Financial Resource Strain (CARDIA)    Difficulty of Paying Living Expenses: Not hard at all  Food Insecurity: No Food Insecurity (07/24/2019)   Hunger Vital Sign    Worried About Running Out of Food in the Last Year: Never true    [place] the Last Year: Never true  Transportation Needs: No Transportation Needs (07/24/2019)   PRAPARE - THydrologist(Medical): No    Lack of Transportation (  Non-Medical): No  Physical Activity: Sufficiently Active (07/24/2019)   Exercise Vital Sign    Days of Exercise per Week: 4 days    Minutes of Exercise per Session: 40 min  Stress: No Stress Concern Present (07/24/2019)   [place]    Feeling of Stress : Only a little  Social Connections: Unknown (07/24/2019)   Social Connection and Isolation Panel [NHANES]    Frequency of Communication with Friends and Family: Three times a week    Frequency of Social Gatherings with Friends and Family: Twice a week    Attends Religious Services: Patient refused    Active Member of Clubs or Organizations: Patient refused    Attends Archivist Meetings: Patient refused    Marital Status: Married  Human resources officer Violence: Not At Risk (07/24/2019)   Humiliation, Afraid, Rape, and Kick questionnaire    Fear of Current or Ex-Partner: No    Emotionally Abused: No    Physically Abused: No    Sexually Abused: No     ROS- All  systems are reviewed and negative except as per the HPI above.  Physical Exam: Vitals:   03/29/22 0930  BP: 124/80  Pulse: (!) 45  Weight: 60.4 kg  Height: '5\' 7"'$  (1.702 m)     GEN- The patient is a well appearing male, alert and oriented x 3 today.   HEENT-head normocephalic, atraumatic, sclera clear, conjunctiva pink, hearing intact, trachea midline. Lungs- Clear to ausculation bilaterally, normal work of breathing Heart- Regular rate and rhythm, bradycardia, no murmurs, rubs or gallops  GI- soft, NT, ND, + BS Extremities- no clubbing, cyanosis, or edema MS- no significant deformity or atrophy Skin- no rash or lesion Psych- euthymic mood, full affect Neuro- strength and sensation are intact   Wt Readings from Last 3 Encounters:  03/29/22 60.4 kg  03/21/22 61.2 kg  03/07/22 61.1 kg    EKG today demonstrates  SB,1st degree AV block Vent. rate 45 BPM PR interval 220 ms QRS duration 110 ms QT/QTcB 504/435 ms  Echo 01/25/22 demonstrated   1. Left ventricular ejection fraction, by estimation, is 70 to 75%. Left  ventricular ejection fraction by PLAX is 70 %. The left ventricle has  hyperdynamic function. The left ventricle has no regional wall motion  abnormalities. There is severe asymmetric left ventricular hypertrophy of the basal-septal segment. Left ventricular diastolic function could not be evaluated.   2. Right ventricular systolic function is mildly reduced. The right  ventricular size is normal. There is normal pulmonary artery systolic  pressure. The estimated right ventricular systolic pressure is 63.8 mmHg.   3. Left atrial size was severely dilated.   4. Right atrial size was mildly dilated.   5. The mitral valve is grossly normal. Mild mitral valve regurgitation.   6. The aortic valve has been repaired/replaced. Aortic valve  regurgitation is not visualized. There is a 25 mm Magna pericardial valve present in the aortic position. Echo findings are consistent  with normal structure and function of the aortic valve  prosthesis. Aortic valve area, by VTI measures 1.51 cm. Aortic valve mean gradient measures 13.6 mmHg. Aortic valve Vmax measures 2.59 m/s.   7. Aortic dilatation noted. There is mild dilatation of the aortic root,  measuring 43 mm. There is borderline dilatation of the ascending aorta, measuring 38 mm.   8. The inferior vena cava is normal in size with greater than 50%  respiratory variability, suggesting right atrial pressure  of 3 mmHg.   Comparison(s): Changes from prior study are noted. 12/03/2020: LVEF 70-75%, bioprosthetic AVR gradient 25 mmHg.   Epic records are reviewed at length today  CHA2DS2-VASc Score = 2  The patient's score is based upon: CHF History: 0 HTN History: 0 Diabetes History: 0 Stroke History: 0 Vascular Disease History: 1 Age Score: 1 Gender Score: 0       ASSESSMENT AND PLAN: 1. Persistent Atrial Fibrillation (ICD10:  I48.19) The patient's CHA2DS2-VASc score is 2, indicating a 2.2% annual risk of stroke.   S/p DCCV on 03/21/22 Patient in [place] today. He reports his baseline resting HR is usually in the upper 40's. Will not make any changes today. Continue amiodarone 200 mg daily Continue Eliquis 5 mg BID  2. Secondary Hypercoagulable State (ICD10:  D68.69) The patient is at significant risk for stroke/thromboembolism based upon his CHA2DS2-VASc Score of 2.  Continue Apixaban (Eliquis).   3. HCM Severe asymmetric hypertrophy of basal-septal segment noted on echo. Followed by Dr Irish Lack  4. CAD No anginal symptoms.   Follow up with Dr Curt Bears as scheduled.    [facility] [address] [place], [place] [address] [phone] 03/29/2022 9:42 AM

## 2022-03-30 ENCOUNTER — Telehealth (HOSPITAL_COMMUNITY): Payer: Self-pay | Admitting: *Deleted

## 2022-03-30 MED ORDER — AMIODARONE HCL 200 MG PO TABS
100.0000 mg | ORAL_TABLET | Freq: Every day | ORAL | 1 refills | Status: DC
Start: 2022-03-30 — End: 2022-04-06

## 2022-03-30 NOTE — Telephone Encounter (Signed)
Patient called in stating since yesterday afternoon he has noticed extreme lightheadedness and dizziness with walking. Denies symptoms when lying/sitting. HR 45-55 BP normal. Discussed with Adline Peals PA will decrease Amiodarone '100mg'$  once a day. Pt in agreement will call later this week with update.

## 2022-03-30 NOTE — Progress Notes (Signed)
Office Visit    Patient Name: James Mayo Date of Encounter: 03/30/2022  Primary Care Provider:  Kathyrn Lass, MD Primary Cardiologist:  James Grooms, MD Primary Electrophysiologist: None  Chief [place] is a 71 y.o. 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  Past Medical History    Past Medical History:  Diagnosis Date   Aortic valve disorder 03/06/2017    S/p AVR with Edwards pericardial tissue valve and replacement of the ascending aorta with a 32 mm Hemashield Side Arm Graft   BASAL CELL CARCINOMA, FACE 08/19/2009   Qualifier: Diagnosis of By: James Alar MD, James     BASAL CELL CARCINOMA, FACE 08/19/2009   Qualifier: Diagnosis of  By: James Alar MD, James     Coronary artery disease    Metatarsalgia of left foot 04/29/2014   Neck pain, bilateral 11/08/2013   PAF (paroxysmal atrial fibrillation) ([place]) 03/09/2017   RHINITIS, ALLERGIC 11/09/2006   Qualifier: Diagnosis of  By: James Mayo     SHOULDER PAIN, RIGHT 05/06/2009   Qualifier: Diagnosis of  By: James Alar MD, James     Thoracic ascending aortic aneurysm ([place])    Thyroid condition    Past Surgical History:  Procedure Laterality Date   AORTIC VALVE REPLACEMENT  03/06/2017   Procedure: AORTIC VALVE REPLACEMENT (AVR) using Magna Ease valve Size 74m;  Surgeon: GGrace Isaac MD;  [facility]  Service: Open Heart Surgery;;   CARDIAC CATHETERIZATION     CARDIOVERSION N/A 04/14/2017   Procedure: CARDIOVERSION;  Surgeon: MLarey Dresser MD;  [facility];  Service: Cardiovascular;  Laterality: N/A;   CARDIOVERSION N/A 02/03/2022   Procedure: CARDIOVERSION;  Surgeon: CBuford Dresser MD;  [facility];  Service: Cardiovascular;  Laterality: N/A;   CARDIOVERSION N/A 03/21/2022   Procedure:  CARDIOVERSION;  Surgeon: OGeralynn Rile MD;  [place] [facility]  Service: Cardiovascular;  Laterality: N/A;   EYE SURGERY Bilateral    LASIK 10 years   REPLACEMENT ASCENDING AORTA N/A 03/06/2017   Procedure: REPLACEMENT ASCENDING AORTA using 3107mHemashield Graft - with Hypothermic Circulatory Arrest;  Surgeon: GeGrace IsaacMD;  [facility] Service: Open [facility] Surgery;  Laterality: N/A;   RIGHT/LEFT HEART CATH AND CORONARY ANGIOGRAPHY N/A 01/25/2017   Procedure: Right/Left Heart Cath and Coronary Angiography;  Surgeon: KeTroy SineMD;  [facility];  Service: Cardiovascular;  Laterality: N/A;   TEE WITHOUT CARDIOVERSION N/A 03/06/2017   Procedure: TRANSESOPHAGEAL ECHOCARDIOGRAM (TEE);  Surgeon: GeGrace IsaacMD;  [facility] Service: Open Heart Surgery;  Laterality: N/A;   TEE WITHOUT CARDIOVERSION N/A 04/14/2017   Procedure: TRANSESOPHAGEAL ECHOCARDIOGRAM (TEE);  Surgeon: James DresserMD;  [facility];  Service: Cardiovascular;  Laterality: N/A;   Allergies  No Known Allergies  History of Present Illness    WaBRADDEN TADROSs a 71 year old maleear old male with the above-mentioned past medical history who presents today for evaluation of lightheadedness and dizziness.[name]  He was recently seen by Dr. VaIrish Lackn 2018 for complaint of abnormal EKG and echo.  He underwent R/LHC on 01/2017 that revealed mild nonobstructive CAD.  He was admitted to the hospital in 02/2017 for new onset atrial fibrillation following ARV replacement.  He was started on metoprolol and Coumadin for valve replacement and patient underwent  DCCV 3 weeks later.   He was successfully cardioverted and converted to sinus bradycardia with PACs.  2D echo completed 11/2020 with stable aortic valve and normal LV function with concentric LVH.  Coumadin was stopped in 2018 after no AF symptoms.  Patient contacted the office on 12/2021 after noticing elevated heart rate after  exercising that was associated with symptoms with activity.  He was started on Eliquis for 4 weeks and outpatient DCCV was scheduled for 02/03/2022.  Patient tolerated procedure well and converted to  sinus bradycardia with PACs after 1 shock.  He unfortunately converted back to atrial fibrillation in June 2023 and was seen in the AF clinic.  He was started on amiodarone 200 mg and sent for repeat DCCV after being loaded on amiodarone.  He was cardioverted on 03/21/2022 following 1 shock.  He was seen in follow-up on 03/29/2022 following cardioversion procedure.  During visit patient was maintaining sinus rhythm and had increased exercise tolerance.  Patient's baseline heart rate usually in upper 40s and contacted the office the following day with complaint of dizziness with ambulation and lightheadedness.  Since last being seen in the office patient reports that he has been experiencing increased dizziness and lightheadedness over the past week.  He is here today with his wife in clinic. [name] contacted the AF clinic on Tuesday of this week and was instructed to decrease his amiodarone to 100 mg daily and follow-up with our clinic today.  Patient states that he notices his dizziness is more prevalent following exercise.  When discussing his volume intake he stated that he only drinks a couple coffee for breakfast and a bottle and a half of water daily.  We discussed the importance of increasing his fluid intake to offset his activity level and the current increase in ambient temperature outside.  His blood pressure today was 116/76 and heart rate is in the high 40s at 48.  This is his normal baseline heart rate.  He  patient denies chest pain, palpitations, dyspnea, PND, orthopnea, nausea, vomiting, dizziness, syncope, edema, weight gain, or early satiety.  Home Medications    Current Outpatient Medications  Medication Sig Dispense Refill   amiodarone (PACERONE) 200 MG tablet Take 0.5 tablets (100 mg  total) by mouth daily. 90 tablet 1   amoxicillin (AMOXIL) 500 MG capsule Take 4 capsules by mouth 1 hour prior to any dental procedures 8 capsule PRN   apixaban (ELIQUIS) 5 MG TABS tablet Take 1 tablet (5 mg total) by mouth 2 (two) times daily. 180 tablet 1   Calcium Citrate 250 MG TABS Take 1 tablet by mouth every morning.     Cholecalciferol (VITAMIN D3) 50 MCG (2000 UT) capsule Take 2,000 Units by mouth daily.     levothyroxine (SYNTHROID) 125 MCG tablet Take 1 tablet (125 mcg total) by mouth in the morning on an empty stomach 90 tablet 1   Multiple Vitamins-Minerals (ICAPS AREDS 2 PO) Take 1 tablet by mouth 2 (two) times daily.     Multiple Vitamins-Minerals (MULTIVITAMIN WITH MINERALS) tablet Take 1 tablet by mouth daily.     Omega-3 1400 MG CAPS Take 1,400 mg by mouth daily.     sildenafil (REVATIO) 20 MG tablet 1-5 tablets daily prn ed     No current facility-administered medications for this visit.     Review of Systems  Please see the history of present illness.    (+) Lightheadedness with physical activity (+) Occasional dizziness with activity  All other  systems reviewed and are otherwise negative except as noted above.  Physical Exam    Wt Readings from Last 3 Encounters:  03/29/22 133 lb 3.2 oz (60.4 kg)  03/21/22 135 lb (61.2 kg)  03/07/22 134 lb 12.8 oz (61.1 kg)   PO:EUMPN were no vitals filed for this visit.,There is no height or weight on file to calculate BMI.  Constitutional:      Appearance: Healthy appearance. Not in distress.  Neck:     Vascular: JVD normal.  Pulmonary:     Effort: Pulmonary effort is normal.     Breath sounds: No wheezing. No rales. Diminished in the bases Cardiovascular:     Normal rate. Regular rhythm. Normal S1. Normal S2.      Murmurs: There is no murmur.  Edema:    Peripheral edema absent.  Abdominal:     Palpations: Abdomen is soft non tender. There is no hepatomegaly.  Skin:    General: Skin is warm and dry.  Neurological:      General: No focal deficit present.     Mental Status: Alert and oriented to person, place and time.     Cranial Nerves: Cranial nerves are intact.  EKG/LABS/Other Studies Reviewed    ECG personally reviewed by me today -none completed today  Risk Assessment/Calculations:    CHA2DS2-VASc Score = 2   This indicates a 2.2% annual risk of stroke. The patient's score is based upon: CHF History: 0 HTN History: 0 Diabetes History: 0 Stroke History: 0 Vascular Disease History: 1 Age Score: 1 Gender Score: 0           Lab Results  Component Value Date   WBC 5.5 03/07/2022   HGB 15.3 03/07/2022   HCT 45.9 03/07/2022   MCV 98.9 03/07/2022   PLT 150 03/07/2022   Lab Results  Component Value Date   CREATININE 0.98 03/07/2022   BUN 31 (H) 03/07/2022   NA 141 03/07/2022   K 4.8 03/07/2022   CL 108 03/07/2022   CO2 29 03/07/2022   Lab Results  Component Value Date   ALT 25 03/07/2022   AST 31 03/07/2022   ALKPHOS 47 03/07/2022   BILITOT 1.6 (H) 03/07/2022   Lab Results  Component Value Date   CHOL 193 07/24/2019   HDL 109 07/24/2019   LDLCALC 75 07/24/2019   TRIG 43 07/24/2019   CHOLHDL 1.8 07/24/2019    Lab Results  Component Value Date   HGBA1C 5.6 07/24/2019    Assessment & Plan    1.  Paroxysmal atrial fibrillation: -New onset A-fib in 2018 following AVR repair. -Patient recently underwent DCCV to sinus rhythm on 03/13/2022 -Patient currently maintaining sinus rhythm by auscultation on amiodarone 100 mg that was recently decreased on Tuesday by AF clinic.  I believe the long half-life of amiodarone will decrease and should help with symptoms -The patient's CHA2DS2-VASc score is 2, indicating a 2.2% annual risk of stroke.  -Continue Eliquis 5 mg twice daily -Most recent CBC 02/2022 with hemoglobin of 15.3 -We will recheck CBC and BMET today to evaluate for possible dehydration -I discussed the importance of increasing hydration to offset any possible  dehydration following activity. -Plans to contact the AF clinic on Monday to report how he is feeling following today's visits.  2.  Ascending aortic aneurysm: -Patient underwent AVR repair and 2018 by Dr. Servando Snare -Primus Bravo aortic valve noted on auscultation -Patient denies any increase swelling or shortness of breath with exertion -Blood pressure well controlled today  at 116/76  3.  Hypertrophic cardiomyopathy: -Last 2D echo was 01/2022 with dilated atria and LV thickening with impaired LV relaxation with asymmetric hypertrophy. -Maintain current blood pressure control  4.  Nonobstructive coronary artery disease: -No anginal complaints today during visit -Patient currently not on ASA therapy due to Eliquis -Patient advised to increase fluid volume as noted above  Disposition: Follow-up with Dr. Curt Bears as scheduled in September    Medication Adjustments/Labs and Tests Ordered: Current medicines are reviewed at length with the patient today.  Concerns regarding medicines are outlined above.   Signed, Mable Fill, Marissa Nestle, NP 03/30/2022, 11:38 AM [place]

## 2022-04-01 ENCOUNTER — Ambulatory Visit: Payer: PPO | Admitting: Nurse Practitioner

## 2022-04-01 ENCOUNTER — Encounter: Payer: Self-pay | Admitting: Nurse Practitioner

## 2022-04-01 VITALS — BP 116/76 | HR 48 | Ht 67.0 in | Wt 132.0 lb

## 2022-04-01 DIAGNOSIS — I421 Obstructive hypertrophic cardiomyopathy: Secondary | ICD-10-CM | POA: Diagnosis not present

## 2022-04-01 DIAGNOSIS — I480 Paroxysmal atrial fibrillation: Secondary | ICD-10-CM | POA: Diagnosis not present

## 2022-04-01 DIAGNOSIS — Z952 Presence of prosthetic heart valve: Secondary | ICD-10-CM | POA: Diagnosis not present

## 2022-04-01 DIAGNOSIS — I7121 Aneurysm of the ascending aorta, without rupture: Secondary | ICD-10-CM

## 2022-04-01 NOTE — Patient Instructions (Addendum)
Medication Instructions:  Your physician recommends that you continue on your current medications as directed. Please refer to the Current Medication list given to you today. *If you need a refill on your cardiac medications before your next appointment, please call your pharmacy*   Lab Work: TODAY-BMET, CBC If you have labs (blood work) drawn today and your tests are completely normal, you will receive your results only by: [place] (if you have MyChart) OR A paper copy in the mail If you have any lab test that is abnormal or we need to change your treatment, we will call you to review the results.   Testing/Procedures: NONE ORDERED   Follow-Up: At [facility] you and your health needs are our priority.  As part of our continuing mission to provide you with exceptional heart care, we have created designated Provider Care Teams.  These Care Teams include your primary Cardiologist (physician) and Advanced Practice Providers (APPs -  Physician Assistants and Nurse Practitioners) who all work together to provide you with the care you need, when you need it.  We recommend signing up for the patient portal called "MyChart".  Sign up information is provided on this After Visit Summary.  MyChart is used to connect with patients for Virtual Visits (Telemedicine).  Patients are able to view lab/test results, encounter notes, upcoming appointments, etc.  Non-urgent messages can be sent to your provider as well.   To learn more about what you can do with MyChart, go to [email or web address].    Your next appointment:   FOLLOW UP AS SCHEDULED   The format for your next appointment:   In Person  Provider:   Allegra Lai, MD   Other Instructions INCREASE YOUR HYDRATION TO HELP WITH DIZZINESS CONTACT AFIB CLINIC ON MONDAY WITH AN UPDATE FOR HOW YOUR FEELING  Important Information About Sugar

## 2022-04-02 LAB — BASIC METABOLIC PANEL
BUN/Creatinine Ratio: 29 — ABNORMAL HIGH (ref 10–24)
BUN: 28 mg/dL — ABNORMAL HIGH (ref 8–27)
CO2: 25 mmol/L (ref 20–29)
Calcium: 9.8 mg/dL (ref 8.6–10.2)
Chloride: 101 mmol/L (ref 96–106)
Creatinine, Ser: 0.95 mg/dL (ref 0.76–1.27)
Glucose: 115 mg/dL — ABNORMAL HIGH (ref 70–99)
Potassium: 4.5 mmol/L (ref 3.5–5.2)
Sodium: 141 mmol/L (ref 134–144)
eGFR: 86 mL/min/{1.73_m2} (ref >59)

## 2022-04-02 LAB — CBC
Hematocrit: 45.5 % (ref 37.5–51.0)
Hemoglobin: 16 g/dL (ref 13.0–17.7)
MCH: 34 pg — ABNORMAL HIGH (ref 26.6–33.0)
MCHC: 35.2 g/dL (ref 31.5–35.7)
MCV: 97 fL (ref 79–97)
Platelets: 163 10*3/uL (ref 150–450)
RBC: 4.7 x10E6/uL (ref 4.14–5.80)
RDW: 12.9 % (ref 11.6–15.4)
WBC: 6.4 10*3/uL (ref 3.4–10.8)

## 2022-04-04 ENCOUNTER — Encounter (HOSPITAL_COMMUNITY): Payer: Self-pay

## 2022-04-06 ENCOUNTER — Telehealth: Payer: Self-pay

## 2022-04-06 NOTE — Telephone Encounter (Signed)
Reached out to patient to review lab findings. Pt states that he is still having a "significant" amount of dizziness and is requesting to further reduce his Amiodarone dose to '50mg'$  daily. Advised that I will send to [facility] for approval/recommendation. Pt requesting MyChart follow-up as he is currently on his way to airport and will not be able to answer calls. Routing to [employer].

## 2022-04-06 NOTE — Telephone Encounter (Signed)
-----   Message from Marylu Lund., NP sent at 04/03/2022 12:05 PM EDT ----- Electrolytes are stable and blood counts are normal.  Please continue to increase your hydration especially in the summer months. Dizziness most likely related to amiodarone and should improve with your reduced dose.  Please let me know if you have any additional questions.  Ambrose Pancoast, NP

## 2022-05-20 ENCOUNTER — Encounter: Payer: Self-pay | Admitting: Interventional Cardiology

## 2022-05-20 ENCOUNTER — Other Ambulatory Visit (HOSPITAL_COMMUNITY): Payer: Self-pay

## 2022-05-26 DIAGNOSIS — D1801 Hemangioma of skin and subcutaneous tissue: Secondary | ICD-10-CM | POA: Diagnosis not present

## 2022-05-26 DIAGNOSIS — L570 Actinic keratosis: Secondary | ICD-10-CM | POA: Diagnosis not present

## 2022-05-26 DIAGNOSIS — L821 Other seborrheic keratosis: Secondary | ICD-10-CM | POA: Diagnosis not present

## 2022-05-26 DIAGNOSIS — D485 Neoplasm of uncertain behavior of skin: Secondary | ICD-10-CM | POA: Diagnosis not present

## 2022-05-26 DIAGNOSIS — D044 Carcinoma in situ of skin of scalp and neck: Secondary | ICD-10-CM | POA: Diagnosis not present

## 2022-05-26 DIAGNOSIS — L814 Other melanin hyperpigmentation: Secondary | ICD-10-CM | POA: Diagnosis not present

## 2022-05-26 DIAGNOSIS — L578 Other skin changes due to chronic exposure to nonionizing radiation: Secondary | ICD-10-CM | POA: Diagnosis not present

## 2022-05-30 ENCOUNTER — Other Ambulatory Visit (HOSPITAL_COMMUNITY): Payer: Self-pay

## 2022-05-30 MED ORDER — AMOXICILLIN 500 MG PO CAPS
2000.0000 mg | ORAL_CAPSULE | ORAL | 99 refills | Status: DC
  Filled 2022-05-30: qty 8, 3d supply, fill #0

## 2022-06-01 ENCOUNTER — Other Ambulatory Visit (HOSPITAL_COMMUNITY): Payer: Self-pay

## 2022-06-03 ENCOUNTER — Other Ambulatory Visit (HOSPITAL_COMMUNITY): Payer: Self-pay

## 2022-06-03 MED ORDER — FLUOROURACIL 5 % EX CREA
1.0000 | TOPICAL_CREAM | Freq: Two times a day (BID) | CUTANEOUS | 0 refills | Status: DC
  Filled 2022-06-03: qty 30, 30d supply, fill #0

## 2022-06-07 ENCOUNTER — Encounter: Payer: Self-pay | Admitting: *Deleted

## 2022-06-07 ENCOUNTER — Ambulatory Visit: Payer: PPO | Attending: Cardiology | Admitting: Cardiology

## 2022-06-07 ENCOUNTER — Encounter: Payer: Self-pay | Admitting: Cardiology

## 2022-06-07 VITALS — BP 148/84 | HR 57 | Ht 67.0 in | Wt 131.0 lb

## 2022-06-07 DIAGNOSIS — D6869 Other thrombophilia: Secondary | ICD-10-CM

## 2022-06-07 DIAGNOSIS — I4819 Other persistent atrial fibrillation: Secondary | ICD-10-CM | POA: Diagnosis not present

## 2022-06-07 NOTE — Patient Instructions (Signed)
Medication Instructions:  Your physician recommends that you continue on your current medications as directed. Please refer to the Current Medication list given to you today.  *If you need a refill on your cardiac medications before your next appointment, please call your pharmacy*   Lab Work: Pre procedure labs -- see procedure instruction letter:  BMP & CBC  If you have labs (blood work) drawn today and your tests are completely normal, you will receive your results only by: [place] (if you have MyChart) OR A paper copy in the mail If you have any lab test that is abnormal or we need to change your treatment, we will call you to review the results.   Testing/Procedures: Your physician has requested that you have cardiac CT within 7 days PRIOR to your ablation. Cardiac computed tomography (CT) is a painless test that uses an x-ray machine to take clear, detailed pictures of your heart.  Please follow instruction below located under "other instructions". You will get a call from our office to schedule the date for this test.  Your physician has recommended that you have an ablation. Catheter ablation is a medical procedure used to treat some cardiac arrhythmias (irregular heartbeats). During catheter ablation, a long, thin, flexible tube is put into a blood vessel in your groin (upper thigh), or neck. This tube is called an ablation catheter. It is then guided to your heart through the blood vessel. Radio frequency waves destroy small areas of heart tissue where abnormal heartbeats may cause an arrhythmia to start. Please follow instruction letter given to you today.   Follow-Up: At [facility], you and your health needs are our priority.  As part of our continuing mission to provide you with exceptional heart care, we have created designated Provider Care Teams.  These Care Teams include your primary Cardiologist (physician) and Advanced Practice Providers (APPs -  Physician  Assistants and Nurse Practitioners) who all work together to provide you with the care you need, when you need it.  Your physician recommends that you schedule a follow-up appointment between 09/19/22 - 10/07/22 in the AFib clinic  (for H&P and pre procedure labs)   Your next appointment:   1 month(s) after your ablation  The format for your next appointment:   In Person  Provider:   AFib clinic   Thank you for choosing [facility]!!   Trinidad Curet, RN [phone]    Other Instructions   Cardiac Ablation Cardiac ablation is a procedure to destroy (ablate) some heart tissue that is sending bad signals. These bad signals cause problems in heart rhythm. The heart has many areas that make these signals. If there are problems in these areas, they can make the heart beat in a way that is not normal. Destroying some tissues can help make the heart rhythm normal. Tell your doctor about: Any allergies you have. All medicines you are taking. These include vitamins, herbs, eye drops, creams, and over-the-counter medicines. Any problems you or family members have had with medicines that make you fall asleep (anesthetics). Any blood disorders you have. Any surgeries you have had. Any medical conditions you have, such as kidney failure. Whether you are pregnant or may be pregnant. What are the risks? This is a safe procedure. But problems may occur, including: Infection. Bruising and bleeding. Bleeding into the chest. Stroke or blood clots. Damage to nearby areas of your body. Allergies to medicines or dyes. The need for a pacemaker if the normal system is  damaged. Failure of the procedure to treat the problem. What happens before the procedure? Medicines Ask your doctor about: Changing or stopping your normal medicines. This is important. Taking aspirin and ibuprofen. Do not take these medicines unless your doctor tells you to take them. Taking other medicines, vitamins,  herbs, and supplements. General instructions Follow instructions from your doctor about what you cannot eat or drink. Plan to have someone take you home from the hospital or clinic. If you will be going home right after the procedure, plan to have someone with you for 24 hours. Ask your doctor what steps will be taken to prevent infection. What happens during the procedure?  An IV tube will be put into one of your veins. You will be given a medicine to help you relax. The skin on your neck or groin will be numbed. A cut (incision) will be made in your neck or groin. A needle will be put through your cut and into a large vein. A tube (catheter) will be put into the needle. The tube will be moved to your heart. Dye may be put through the tube. This helps your doctor see your heart. Small devices (electrodes) on the tube will send out signals. A type of energy will be used to destroy some heart tissue. The tube will be taken out. Pressure will be held on your cut. This helps stop bleeding. A bandage will be put over your cut. The exact procedure may vary among doctors and hospitals. What happens after the procedure? You will be watched until you leave the hospital or clinic. This includes checking your heart rate, breathing rate, oxygen, and blood pressure. Your cut will be watched for bleeding. You will need to lie still for a few hours. Do not drive for 24 hours or as long as your doctor tells you. Summary Cardiac ablation is a procedure to destroy some heart tissue. This is done to treat heart rhythm problems. Tell your doctor about any medical conditions you may have. Tell him or her about all medicines you are taking to treat them. This is a safe procedure. But problems may occur. These include infection, bruising, bleeding, and damage to nearby areas of your body. Follow what your doctor tells you about food and drink. You may also be told to change or stop some of your  medicines. After the procedure, do not drive for 24 hours or as long as your doctor tells you. This information is not intended to replace advice given to you by your health care provider. Make sure you discuss any questions you have with your health care provider. Document Revised: 11/19/2021 Document Reviewed: 08/01/2019 Elsevier Patient Education  [place].

## 2022-06-07 NOTE — Progress Notes (Signed)
Electrophysiology Office Note   Date:  06/07/2022   ID:  James Mayo, MRN [id number]  PCP:  Kathyrn Lass, MD  Cardiologist:  Irish Lack Primary Electrophysiologist:  Sonjia Wilcoxson Meredith Leeds, MD    Chief Complaint: AF   History of Present Illness: James Mayo is a 71 y.o. male who is being seen today for the evaluation of AF at the request of Fenton, Clint R, PA. Presenting today for electrophysiology evaluation.  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  He was diagnosed with atrial fibrillation in 2018 postoperatively following aortic valve replacement.  He underwent cardioversion at the time.  He was seen 01/06/2022 and was noted to be in atrial fibrillation.  He had symptoms of decreased exercise tolerance.  He was started on Eliquis and had a cardioversion, but went back into atrial fibrillation.  He was started on amiodarone 02/17/2022. He went back into atrial fibrillation and had a repeat cardioversion 03/21/2022.  Today, he denies symptoms of palpitations, chest pain, shortness of breath, orthopnea, PND, lower extremity edema, claudication, dizziness, presyncope, syncope, bleeding, or neurologic sequela. The patient is tolerating medications without difficulties.    Past Medical History:  Diagnosis Date   Aortic valve disorder 03/06/2017    S/p AVR with Edwards pericardial tissue valve and replacement of the ascending aorta with a 32 mm Hemashield Side Arm Graft   BASAL CELL CARCINOMA, FACE 08/19/2009   Qualifier: Diagnosis of By: Oneida Alar MD, KARL     BASAL CELL CARCINOMA, FACE 08/19/2009   Qualifier: Diagnosis of  By: Oneida Alar MD, KARL     Coronary artery disease    Metatarsalgia of left foot 04/29/2014   Neck pain, bilateral 11/08/2013   PAF (paroxysmal atrial fibrillation) ([place]) 03/09/2017   RHINITIS, ALLERGIC 11/09/2006   Qualifier: Diagnosis of  By: Samara Snide      SHOULDER PAIN, RIGHT 05/06/2009   Qualifier: Diagnosis of  By: Oneida Alar MD, KARL     Thoracic ascending aortic aneurysm ([place])    Thyroid condition    Past Surgical History:  Procedure Laterality Date   AORTIC VALVE REPLACEMENT  03/06/2017   Procedure: AORTIC VALVE REPLACEMENT (AVR) using Magna Ease valve Size 42m;  Surgeon: GGrace Isaac MD;  [place] [facility]  Service: Open Heart Surgery;;   CARDIAC CATHETERIZATION     CARDIOVERSION N/A 04/14/2017   Procedure: CARDIOVERSION;  Surgeon: MLarey Dresser MD;  [facility];  Service: Cardiovascular;  Laterality: N/A;   CARDIOVERSION N/A 02/03/2022   Procedure: CARDIOVERSION;  Surgeon: CBuford Dresser MD;  [facility]  Service: Cardiovascular;  Laterality: N/A;   CARDIOVERSION N/A 03/21/2022   Procedure: CARDIOVERSION;  Surgeon: OGeralynn Rile MD;  [place] [facility]  Service: Cardiovascular;  Laterality: N/A;   EYE SURGERY Bilateral    LASIK 10 years   REPLACEMENT ASCENDING AORTA N/A 03/06/2017   Procedure: REPLACEMENT ASCENDING AORTA using 322mHemashield Graft - with Hypothermic Circulatory Arrest;  Surgeon: GeGrace IsaacMD;  [facility] Service: Open Heart Surgery;  Laterality: N/A;   RIGHT/LEFT HEART CATH AND CORONARY ANGIOGRAPHY N/A 01/25/2017   Procedure: Right/Left Heart Cath and Coronary Angiography;  Surgeon: KeTroy SineMD;  [facility];  Service: Cardiovascular;  Laterality: N/A;   TEE WITHOUT CARDIOVERSION N/A 03/06/2017   Procedure: TRANSESOPHAGEAL ECHOCARDIOGRAM (TEE);  Surgeon: GeGrace IsaacMD;  [facility] Service: [facility];  Laterality: N/A;   TEE WITHOUT CARDIOVERSION N/A 04/14/2017   Procedure: TRANSESOPHAGEAL ECHOCARDIOGRAM (TEE);  Surgeon: Larey Dresser, MD;  [facility];  Service: Cardiovascular;  Laterality: N/A;     Current Outpatient Medications  Medication Sig Dispense Refill   amoxicillin (AMOXIL) 500 MG  capsule Take 4 capsules (2,000 mg total) by mouth 1 hour prior to dental procedure 8 capsule PRN   apixaban (ELIQUIS) 5 MG TABS tablet Take 1 tablet (5 mg total) by mouth 2 (two) times daily. (Patient not taking: Reported on 06/07/2022) 180 tablet 1   Calcium Citrate 250 MG TABS Take 1 tablet by mouth every morning.     Cholecalciferol (VITAMIN D3) 50 MCG (2000 UT) capsule Take 2,000 Units by mouth daily.     fluorouracil (EFUDEX) 5 % cream Apply 1 Application topically 2 (two) times daily. 30 g 0   levothyroxine (SYNTHROID) 125 MCG tablet Take 1 tablet (125 mcg total) by mouth in the morning on an empty stomach 90 tablet 1   Multiple Vitamins-Minerals (ICAPS AREDS 2 PO) Take 1 tablet by mouth 2 (two) times daily. (Patient not taking: Reported on 06/07/2022)     Multiple Vitamins-Minerals (MULTIVITAMIN WITH MINERALS) tablet Take 1 tablet by mouth daily. (Patient not taking: Reported on 06/07/2022)     Omega-3 1400 MG CAPS Take 1,400 mg by mouth daily. (Patient not taking: Reported on 06/07/2022)     sildenafil (REVATIO) 20 MG tablet 1-5 tablets daily prn ed (Patient not taking: Reported on 06/07/2022)     No current facility-administered medications for this visit.    Allergies:   Patient has no known allergies.   Social History:  The patient  reports that he has never smoked. He has never used smokeless tobacco. He reports current alcohol use. He reports that he does not use drugs.   Family History:  The patient's family history includes Hypertension in his mother; Mental illness in his brother.    ROS:  Please see the history of present illness.   Otherwise, review of systems is positive for none.   All other systems are reviewed and negative.    PHYSICAL EXAM: VS:  BP (!) 148/84   Pulse (!) 57   Ht '5\' 7"'$  (1.702 m)   Wt 131 lb (59.4 kg)   SpO2 97%   BMI 20.52 kg/m  , BMI Body mass index is 20.52 kg/m. GEN: Well nourished, well developed, in no acute distress  HEENT: normal  Neck: no  JVD, carotid bruits, or masses Cardiac: RRR; no murmurs, rubs, or gallops,no edema  Respiratory:  clear to auscultation bilaterally, normal work of breathing GI: soft, nontender, nondistended, + BS MS: no deformity or atrophy  Skin: warm and dry Neuro:  Strength and sensation are intact Psych: euthymic mood, full affect  EKG:  EKG is ordered today. Personal review of the ekg ordered shows sinus rhythm, rate 57  Recent Labs: 03/07/2022: ALT 25; TSH 5.441 04/01/2022: BUN 28; Creatinine, Ser 0.95; Hemoglobin 16.0; Platelets 163; Potassium 4.5; Sodium 141    Lipid Panel     Component Value Date/Time   CHOL 193 07/24/2019 1144   TRIG 43 07/24/2019 1144   HDL 109 07/24/2019 1144   CHOLHDL 1.8 07/24/2019 1144   CHOLHDL 1.9 09/16/2015 1529   VLDL 9 09/16/2015 1529   LDLCALC 75 07/24/2019 1144     Wt Readings from Last 3 Encounters:  06/07/22 131 lb (59.4 kg)  04/01/22 132 lb (59.9 kg)  03/29/22 133 lb 3.2  oz (60.4 kg)      Other studies Reviewed: Additional studies/ records that were reviewed today include: TTE 01/25/22  Review of the above records today demonstrates:   1. Left ventricular ejection fraction, by estimation, is 70 to 75%. Left  ventricular ejection fraction by PLAX is 70 %. The left ventricle has  hyperdynamic function. The left ventricle has no regional wall motion  abnormalities. There is severe  asymmetric left ventricular hypertrophy of the basal-septal segment. Left  ventricular diastolic function could not be evaluated.   2. Right ventricular systolic function is mildly reduced. The right  ventricular size is normal. There is normal pulmonary artery systolic  pressure. The estimated right ventricular systolic pressure is 91.4 mmHg.   3. Left atrial size was severely dilated.   4. Right atrial size was mildly dilated.   5. The mitral valve is grossly normal. Mild mitral valve regurgitation.   6. The aortic valve has been repaired/replaced. Aortic valve   regurgitation is not visualized. There is a 25 mm Magna pericardial valve  present in the aortic position. Echo findings are consistent with normal  structure and function of the aortic valve  prosthesis. Aortic valve area, by VTI measures 1.51 cm. Aortic valve mean  gradient measures 13.6 mmHg. Aortic valve Vmax measures 2.59 m/s.   7. Aortic dilatation noted. There is mild dilatation of the aortic root,  measuring 43 mm. There is borderline dilatation of the ascending aorta,  measuring 38 mm.   8. The inferior vena cava is normal in size with greater than 50%  respiratory variability, suggesting right atrial pressure of 3 mmHg.    ASSESSMENT AND PLAN:  1.  1.  Persistent atrial fibrillation: CHA2DS2-VASc of 2.  Currently on Eliquis 5 mg twice daily.  He was initially on amiodarone but felt quite poorly.  Due to how he is feeling on amiodarone he would like to avoid medications for therapy of atrial fibrillation.  We Sobia Karger plan for ablation.  He does understand that with his severely dilated left atrium, that he may require antiarrhythmic medications as well.  He would prefer to try ablation as the initial form of therapy.  2.  Secondary hypercoagulable state: Currently on Eliquis for atrial fibrillation as above  3.  Coronary artery disease: No current angina  4.  Aortic valve/root replacement: Stable on most recent echo.  Continue with plan per primary cardiology   Current medicines are reviewed at length with the patient today.   The patient does not have concerns regarding his medicines.  The following changes were made today:  none  Labs/ tests ordered today include:  Orders Placed This Encounter  Procedures   CT CARDIAC MORPH/PULM VEIN W/CM&W/O CA SCORE   EKG 12-Lead     Disposition:   FU with Avyay Coger 3 months  Signed, Loretto Belinsky Meredith Leeds, MD  06/07/2022 1:33 PM     [address] [place] [address] [phone]  (office) [phone] (fax)

## 2022-06-09 DIAGNOSIS — C44319 Basal cell carcinoma of skin of other parts of face: Secondary | ICD-10-CM | POA: Diagnosis not present

## 2022-06-15 ENCOUNTER — Encounter: Payer: Self-pay | Admitting: Interventional Cardiology

## 2022-07-04 ENCOUNTER — Other Ambulatory Visit (HOSPITAL_COMMUNITY): Payer: Self-pay

## 2022-07-12 DIAGNOSIS — H2513 Age-related nuclear cataract, bilateral: Secondary | ICD-10-CM | POA: Diagnosis not present

## 2022-07-12 DIAGNOSIS — H353132 Nonexudative age-related macular degeneration, bilateral, intermediate dry stage: Secondary | ICD-10-CM | POA: Diagnosis not present

## 2022-07-12 DIAGNOSIS — H52203 Unspecified astigmatism, bilateral: Secondary | ICD-10-CM | POA: Diagnosis not present

## 2022-07-24 ENCOUNTER — Encounter: Payer: Self-pay | Admitting: Interventional Cardiology

## 2022-07-26 DIAGNOSIS — R519 Headache, unspecified: Secondary | ICD-10-CM | POA: Diagnosis not present

## 2022-07-26 DIAGNOSIS — R42 Dizziness and giddiness: Secondary | ICD-10-CM | POA: Diagnosis not present

## 2022-07-26 DIAGNOSIS — R293 Abnormal posture: Secondary | ICD-10-CM | POA: Diagnosis not present

## 2022-07-26 DIAGNOSIS — Z6820 Body mass index (BMI) 20.0-20.9, adult: Secondary | ICD-10-CM | POA: Diagnosis not present

## 2022-07-26 DIAGNOSIS — R2689 Other abnormalities of gait and mobility: Secondary | ICD-10-CM | POA: Diagnosis not present
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
## 2022-08-10 ENCOUNTER — Ambulatory Visit: Payer: PPO | Attending: Family Medicine | Admitting: Physical Therapy

## 2022-08-10 DIAGNOSIS — M6281 Muscle weakness (generalized): Secondary | ICD-10-CM | POA: Insufficient documentation

## 2022-08-10 DIAGNOSIS — M542 Cervicalgia: Secondary | ICD-10-CM | POA: Insufficient documentation

## 2022-08-10 DIAGNOSIS — R293 Abnormal posture: Secondary | ICD-10-CM | POA: Insufficient documentation

## 2022-08-11 ENCOUNTER — Encounter: Payer: Self-pay | Admitting: Physical Therapy
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
## 2022-08-17 ENCOUNTER — Ambulatory Visit: Payer: PPO | Attending: Family Medicine | Admitting: Physical Therapy

## 2022-08-17 ENCOUNTER — Encounter: Payer: Self-pay | Admitting: Physical Therapy

## 2022-08-17 DIAGNOSIS — M542 Cervicalgia: Secondary | ICD-10-CM | POA: Insufficient documentation

## 2022-08-17 DIAGNOSIS — M6281 Muscle weakness (generalized): Secondary | ICD-10-CM | POA: Diagnosis not present

## 2022-08-17 DIAGNOSIS — R293 Abnormal posture: Secondary | ICD-10-CM | POA: Insufficient documentation
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
## 2022-08-19 ENCOUNTER — Encounter: Payer: Self-pay | Admitting: Physical Therapy

## 2022-08-19 ENCOUNTER — Ambulatory Visit: Payer: PPO | Admitting: Physical Therapy

## 2022-08-19 DIAGNOSIS — R293 Abnormal posture: Secondary | ICD-10-CM

## 2022-08-19 DIAGNOSIS — M6281 Muscle weakness (generalized): Secondary | ICD-10-CM

## 2022-08-19 DIAGNOSIS — M542 Cervicalgia: Secondary | ICD-10-CM

## 2022-08-24 ENCOUNTER — Telehealth: Payer: Self-pay | Admitting: Physical Therapy

## 2022-08-24 ENCOUNTER — Encounter: Payer: Self-pay | Admitting: Physical Therapy

## 2022-08-24 ENCOUNTER — Other Ambulatory Visit (HOSPITAL_COMMUNITY): Payer: Self-pay

## 2022-08-24 ENCOUNTER — Ambulatory Visit: Payer: PPO | Admitting: Physical Therapy

## 2022-08-24 DIAGNOSIS — R293 Abnormal posture: Secondary | ICD-10-CM | POA: Diagnosis not present

## 2022-08-24 DIAGNOSIS — M542 Cervicalgia: Secondary | ICD-10-CM

## 2022-08-24 DIAGNOSIS — M6281 Muscle weakness (generalized): Secondary | ICD-10-CM

## 2022-08-24 NOTE — Telephone Encounter (Signed)
Called patient regarding his missed appt this morning.  It is his first no show. Left voicemail and offered opening tomorrow.   Raeford Razor, PT 08/24/22 10:50 AM Phone: [phone] Fax: [phone]
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
## 2022-08-25 ENCOUNTER — Other Ambulatory Visit (HOSPITAL_COMMUNITY): Payer: Self-pay

## 2022-08-25 MED ORDER — LEVOTHYROXINE SODIUM 125 MCG PO TABS
125.0000 ug | ORAL_TABLET | Freq: Every morning | ORAL | 0 refills | Status: DC
  Filled 2022-08-25: qty 90, 90d supply, fill #0

## 2022-08-26 ENCOUNTER — Ambulatory Visit: Payer: PPO | Admitting: Physical Therapy

## 2022-08-29 DIAGNOSIS — E039 Hypothyroidism, unspecified: Secondary | ICD-10-CM | POA: Diagnosis not present

## 2022-09-28 ENCOUNTER — Encounter: Payer: PPO | Admitting: Physical Therapy

## 2022-09-29 ENCOUNTER — Encounter: Payer: PPO | Admitting: Physical Therapy

## 2022-09-29 ENCOUNTER — Ambulatory Visit (HOSPITAL_COMMUNITY)
Admission: RE | Admit: 2022-09-29 | Discharge: 2022-09-29 | Disposition: A | Discharge status: Home / Self Care | Payer: PPO | Source: Ambulatory Visit | Attending: Physician Assistant | Admitting: Physician Assistant

## 2022-09-29 ENCOUNTER — Encounter (HOSPITAL_COMMUNITY): Payer: Self-pay | Admitting: Physician Assistant

## 2022-09-29 VITALS — BP 138/80 | HR 54 | Ht 67.0 in | Wt 135.0 lb

## 2022-09-29 DIAGNOSIS — I2510 Atherosclerotic heart disease of native coronary artery without angina pectoris: Secondary | ICD-10-CM | POA: Diagnosis not present

## 2022-09-29 DIAGNOSIS — Z7901 Long term (current) use of anticoagulants: Secondary | ICD-10-CM | POA: Insufficient documentation

## 2022-09-29 DIAGNOSIS — I517 Cardiomegaly: Secondary | ICD-10-CM | POA: Diagnosis not present

## 2022-09-29 DIAGNOSIS — I712 Thoracic aortic aneurysm, without rupture, unspecified: Secondary | ICD-10-CM | POA: Insufficient documentation

## 2022-09-29 DIAGNOSIS — D6869 Other thrombophilia: Secondary | ICD-10-CM | POA: Insufficient documentation

## 2022-09-29 DIAGNOSIS — I4819 Other persistent atrial fibrillation: Secondary | ICD-10-CM | POA: Diagnosis not present

## 2022-09-29 NOTE — Progress Notes (Signed)
Primary Care Physician: Kathyrn Lass, MD Primary Cardiologist: Dr Irish Lack  Primary Electrophysiologist: Dr Curt Bears  Referring Physician: Dr Antony Madura is a 72 y.o. 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 The patient was initially diagnosed with atrial fibrillation in 2018 postoperatively following his aortic valve replacement. He underwent DCCV at that time. He was seen by Dr Irish Lack on 01/06/22 when the patient noted afib on his Apple Watch. He did have symptoms of decreased exercise tolerance. He was started on Eliquis for a CHADS2VASC score of 2 and had DCCV on 02/03/22. Unfortunately, he was back in afib about 6 days later. Started on amiodarone 02/17/22. Patient is s/p DCCV on 03/21/22. Amiodarone was eventually discontinued due to intolerable side effects of dizziness and fatigue.   On follow up today, patient is in SR today, doing well. He is scheduled for an afib ablation with Dr Curt Bears on 10/18/22. No bleeding issues on anticoagulation.   Today, he denies symptoms of palpitations, chest pain, shortness of breath, orthopnea, PND, lower extremity edema, dizziness, presyncope, syncope, snoring, daytime somnolence, bleeding, or neurologic sequela. The patient is tolerating medications without difficulties and is otherwise without complaint today.    Atrial Fibrillation Risk Factors:  he does not have symptoms or diagnosis of sleep apnea. he does not have a history of rheumatic fever. he does have a history of alcohol use.   he has a BMI of Body mass index is 21.14 kg/m.Marland Kitchen Filed Weights   09/29/22 1417  Weight: 61.2 kg    Family History  Problem Relation Age of Onset   Hypertension Mother    Mental illness Brother      Atrial Fibrillation Management history:  Previous antiarrhythmic drugs: amiodarone   Previous cardioversions: 2018, 02/03/22, 03/21/22 Previous ablations: none CHADS2VASC score: 2 Anticoagulation history: Eliquis   Past Medical History:  Diagnosis Date   Aortic valve disorder 03/06/2017    S/p AVR with Edwards pericardial tissue valve and replacement of the ascending aorta with a 32 mm Hemashield Side Arm Graft   BASAL CELL CARCINOMA, FACE 08/19/2009   Qualifier: Diagnosis of By: Oneida Alar MD, KARL     BASAL CELL CARCINOMA, FACE 08/19/2009   Qualifier: Diagnosis of  By: Oneida Alar MD, KARL     Coronary artery disease    Metatarsalgia of left foot 04/29/2014   Neck pain, bilateral 11/08/2013   PAF (paroxysmal atrial fibrillation) ([place]) 03/09/2017   RHINITIS, ALLERGIC 11/09/2006   Qualifier: Diagnosis of  By: Samara Snide     SHOULDER PAIN, RIGHT 05/06/2009   Qualifier: Diagnosis of  By: Oneida Alar MD, KARL     Thoracic ascending aortic aneurysm ([place])    Thyroid condition    Past Surgical History:  Procedure Laterality Date   AORTIC VALVE REPLACEMENT  03/06/2017   Procedure: AORTIC VALVE REPLACEMENT (AVR) using Magna Ease valve Size 25m;  Surgeon: GGrace Isaac MD;  [facility]  Service: Open Heart Surgery;;   CARDIAC CATHETERIZATION     CARDIOVERSION N/A 04/14/2017   Procedure: CARDIOVERSION;  Surgeon: MLarey Dresser MD;  [facility];  Service: Cardiovascular;  Laterality: N/A;   CARDIOVERSION N/A 02/03/2022   Procedure: CARDIOVERSION;  Surgeon: CBuford Dresser MD;  [facility];  Service: Cardiovascular;  Laterality: N/A;   CARDIOVERSION N/A 03/21/2022   Procedure: CARDIOVERSION;  Surgeon: OGeralynn Rile  MD;  Location: [place];  Service: Cardiovascular;  Laterality: N/A;   EYE SURGERY Bilateral    LASIK 10 years   REPLACEMENT ASCENDING AORTA N/A 03/06/2017   Procedure: REPLACEMENT ASCENDING AORTA using 72m Hemashield Graft - with Hypothermic Circulatory Arrest;  Surgeon: GGrace Isaac MD;  [facility]  Service: Open  Heart Surgery;  Laterality: N/A;   RIGHT/LEFT HEART CATH AND CORONARY ANGIOGRAPHY N/A 01/25/2017   Procedure: Right/Left Heart Cath and Coronary Angiography;  Surgeon: KTroy Sine MD;  [facility];  Service: Cardiovascular;  Laterality: N/A;   TEE WITHOUT CARDIOVERSION N/A 03/06/2017   Procedure: TRANSESOPHAGEAL ECHOCARDIOGRAM (TEE);  Surgeon: GGrace Isaac MD;  [facility]  Service: Open Heart Surgery;  Laterality: N/A;   TEE WITHOUT CARDIOVERSION N/A 04/14/2017   Procedure: TRANSESOPHAGEAL ECHOCARDIOGRAM (TEE);  Surgeon: MLarey Dresser MD;  [facility];  Service: Cardiovascular;  Laterality: N/A;    Current Outpatient Medications  Medication Sig Dispense Refill   amoxicillin (AMOXIL) 500 MG capsule Take 4 capsules (2,000 mg total) by mouth 1 hour prior to dental procedure 8 capsule PRN   apixaban (ELIQUIS) 5 MG TABS tablet Take 1 tablet (5 mg total) by mouth 2 (two) times daily. 180 tablet 1   Calcium Citrate 250 MG TABS Take 1 tablet by mouth every morning.     Cholecalciferol (VITAMIN D3) 50 MCG (2000 UT) capsule Take 2,000 Units by mouth daily.     levothyroxine (SYNTHROID) 125 MCG tablet Take 1 tablet (125 mcg total) by mouth in the morning on an empty stomach 90 tablet 0   Multiple Vitamins-Minerals (ICAPS AREDS 2 PO) Take 1 tablet by mouth 2 (two) times daily.     sildenafil (REVATIO) 20 MG tablet      No current facility-administered medications for this encounter.    No Known Allergies   Social History   Socioeconomic History   Marital status: Married    Spouse name: Not on file   Number of children: Not on file   Years of education: Not on file   Highest education level: Not on file  Occupational History   Not on file  Tobacco Use   Smoking status: Never   Smokeless tobacco: Never   Tobacco comments:    Never smoke 02/17/22  Vaping Use   Vaping Use: Never used  Substance and Sexual Activity   Alcohol use: Yes     Comment: 2 glasses of wine or 2 beers - 2-3 times/ week 02/17/22   Drug use: No   Sexual activity: Not Currently  Other Topics Concern   Not on file  Social History Narrative   ** Merged History Encounter **       Social Determinants of Health   Financial Resource Strain: Low Risk  (07/24/2019)   Overall Financial Resource Strain (CARDIA)    Difficulty of Paying Living Expenses: Not hard at all  Food Insecurity: No Food Insecurity (07/24/2019)   Hunger Vital Sign    Worried About Running Out of Food in the Last Year: Never true    RChain of Rocksin the Last Year: Never true  Transportation Needs: No Transportation Needs (07/24/2019)   PRAPARE - THydrologist(Medical): No    Lack of Transportation (Non-Medical): No  Physical Activity: Sufficiently Active (07/24/2019)   Exercise Vital Sign    Days of Exercise per Week: 4 days    Minutes of Exercise per Session: 40  min  Stress: No Stress Concern Present (07/24/2019)   [place]    Feeling of Stress : Only a little  Social Connections: Unknown (07/24/2019)   Social Connection and Isolation Panel [NHANES]    Frequency of Communication with Friends and Family: Three times a week    Frequency of Social Gatherings with Friends and Family: Twice a week    Attends Religious Services: Patient refused    Active Member of Clubs or Organizations: Patient refused    Attends Archivist Meetings: Patient refused    Marital Status: Married  Human resources officer Violence: Not At Risk (07/24/2019)   Humiliation, Afraid, Rape, and Kick questionnaire    Fear of Current or Ex-Partner: No    Emotionally Abused: No    Physically Abused: No    Sexually Abused: No     ROS- All systems are reviewed and negative except as per the HPI above.  Physical Exam: Vitals:   09/29/22 1417  BP: 138/80  Pulse: (!) 54  Weight: 61.2 kg  Height: '5\' 7"'$   (1.702 m)    GEN- The patient is a well appearing male, alert and oriented x 3 today.   HEENT-head normocephalic, atraumatic, sclera clear, conjunctiva pink, hearing intact, trachea midline. Lungs- Clear to ausculation bilaterally, normal work of breathing Heart- Regular rate and rhythm, no murmurs, rubs or gallops  GI- soft, NT, ND, + BS Extremities- no clubbing, cyanosis, or edema MS- no significant deformity or atrophy Skin- no rash or lesion Psych- euthymic mood, full affect Neuro- strength and sensation are intact   Wt Readings from Last 3 Encounters:  09/29/22 61.2 kg  06/07/22 59.4 kg  04/01/22 59.9 kg    EKG today demonstrates  SB Vent. rate 54 BPM PR interval 198 ms QRS duration 98 ms QT/QTcB 418/396 ms  Echo 01/25/22 demonstrated   1. Left ventricular ejection fraction, by estimation, is 70 to 75%. Left  ventricular ejection fraction by PLAX is 70 %. The left ventricle has  hyperdynamic function. The left ventricle has no regional wall motion  abnormalities. There is severe asymmetric left ventricular hypertrophy of the basal-septal segment. Left ventricular diastolic function could not be evaluated.   2. Right ventricular systolic function is mildly reduced. The right  ventricular size is normal. There is normal pulmonary artery systolic  pressure. The estimated right ventricular systolic pressure is 60.6 mmHg.   3. Left atrial size was severely dilated.   4. Right atrial size was mildly dilated.   5. The mitral valve is grossly normal. Mild mitral valve regurgitation.   6. The aortic valve has been repaired/replaced. Aortic valve  regurgitation is not visualized. There is a 25 mm Magna pericardial valve present in the aortic position. Echo findings are consistent with normal structure and function of the aortic valve  prosthesis. Aortic valve area, by VTI measures 1.51 cm. Aortic valve mean gradient measures 13.6 mmHg. Aortic valve Vmax measures 2.59 m/s.   7.  Aortic dilatation noted. There is mild dilatation of the aortic root,  measuring 43 mm. There is borderline dilatation of the ascending aorta, measuring 38 mm.   8. The inferior vena cava is normal in size with greater than 50%  respiratory variability, suggesting right atrial pressure of 3 mmHg.   Comparison(s): Changes from prior study are noted. 12/03/2020: LVEF 70-75%, bioprosthetic AVR gradient 25 mmHg.   Epic records are reviewed at length today  CHA2DS2-VASc Score = 2  The patient's  score is based upon: CHF History: 0 HTN History: 0 Diabetes History: 0 Stroke History: 0 Vascular Disease History: 1 Age Score: 1 Gender Score: 0       ASSESSMENT AND PLAN: 1. Persistent Atrial Fibrillation (ICD10:  I48.19) The patient's CHA2DS2-VASc score is 2, indicating a 2.2% annual risk of stroke.   Patient off AAD, in [place]. Scheduled for afib ablation with Dr Curt Bears on 10/18/22.  CT scan 10/11/22 Continue Eliquis 5 mg BID  2. Secondary Hypercoagulable State (ICD10:  D68.69) The patient is at significant risk for stroke/thromboembolism based upon his CHA2DS2-VASc Score of 2.  Continue Apixaban (Eliquis).   3. HCM Severe asymmetric hypertrophy of basal-septal segment  4. CAD No anginal symptoms.   Follow up for ablation as scheduled.    [facility] [address] [place], [place] [address] [phone] 09/29/2022 2:43 PM

## 2022-09-29 NOTE — H&P (View-Only) (Signed)
Primary Care Physician: Kathyrn Lass, MD Primary Cardiologist: Dr Irish Lack  Primary Electrophysiologist: Dr Curt Bears  Referring Physician: Dr Antony Madura is a 72 y.o. 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 The patient was initially diagnosed with atrial fibrillation in 2018 postoperatively following his aortic valve replacement. He underwent DCCV at that time. He was seen by Dr Irish Lack on 01/06/22 when the patient noted afib on his Apple Watch. He did have symptoms of decreased exercise tolerance. He was started on Eliquis for a CHADS2VASC score of 2 and had DCCV on 02/03/22. Unfortunately, he was back in afib about 6 days later. Started on amiodarone 02/17/22. Patient is s/p DCCV on 03/21/22. Amiodarone was eventually discontinued due to intolerable side effects of dizziness and fatigue.   On follow up today, patient is in SR today, doing well. He is scheduled for an afib ablation with Dr Curt Bears on 10/18/22. No bleeding issues on anticoagulation.   Today, he denies symptoms of palpitations, chest pain, shortness of breath, orthopnea, PND, lower extremity edema, dizziness, presyncope, syncope, snoring, daytime somnolence, bleeding, or neurologic sequela. The patient is tolerating medications without difficulties and is otherwise without complaint today.    Atrial Fibrillation Risk Factors:  he does not have symptoms or diagnosis of sleep apnea. he does not have a history of rheumatic fever. he does have a history of alcohol use.   he has a BMI of Body mass index is 21.14 kg/m.Marland Kitchen Filed Weights   09/29/22 1417  Weight: 61.2 kg    Family History  Problem Relation Age of Onset   Hypertension Mother    Mental illness Brother      Atrial Fibrillation Management history:  Previous antiarrhythmic drugs: amiodarone   Previous cardioversions: 2018, 02/03/22, 03/21/22 Previous ablations: none CHADS2VASC score: 2 Anticoagulation history: Eliquis   Past Medical History:  Diagnosis Date   Aortic valve disorder 03/06/2017    S/p AVR with Edwards pericardial tissue valve and replacement of the ascending aorta with a 32 mm Hemashield Side Arm Graft   BASAL CELL CARCINOMA, FACE 08/19/2009   Qualifier: Diagnosis of By: Oneida Alar MD, KARL     BASAL CELL CARCINOMA, FACE 08/19/2009   Qualifier: Diagnosis of  By: Oneida Alar MD, KARL     Coronary artery disease    Metatarsalgia of left foot 04/29/2014   Neck pain, bilateral 11/08/2013   PAF (paroxysmal atrial fibrillation) ([place]) 03/09/2017   RHINITIS, ALLERGIC 11/09/2006   Qualifier: Diagnosis of  By: Samara Snide     SHOULDER PAIN, RIGHT 05/06/2009   Qualifier: Diagnosis of  By: Oneida Alar MD, KARL     Thoracic ascending aortic aneurysm ([place])    Thyroid condition    Past Surgical History:  Procedure Laterality Date   AORTIC VALVE REPLACEMENT  03/06/2017   Procedure: AORTIC VALVE REPLACEMENT (AVR) using Magna Ease valve Size 12m;  Surgeon: GGrace Isaac MD;  [facility]  Service: Open Heart Surgery;;   CARDIAC CATHETERIZATION     CARDIOVERSION N/A 04/14/2017   Procedure: CARDIOVERSION;  Surgeon: MLarey Dresser MD;  [facility];  Service: Cardiovascular;  Laterality: N/A;   CARDIOVERSION N/A 02/03/2022   Procedure: CARDIOVERSION;  Surgeon: CBuford Dresser MD;  [facility];  Service: Cardiovascular;  Laterality: N/A;   CARDIOVERSION N/A 03/21/2022   Procedure: CARDIOVERSION;  Surgeon: OGeralynn Rile  MD;  Location: [facility];  Service: Cardiovascular;  Laterality: N/A;   EYE SURGERY Bilateral    LASIK 10 years   REPLACEMENT ASCENDING AORTA N/A 03/06/2017   Procedure: REPLACEMENT ASCENDING AORTA using 36m Hemashield Graft - with Hypothermic Circulatory Arrest;  Surgeon: GGrace Isaac MD;  [facility]  Service: Open  Heart Surgery;  Laterality: N/A;   RIGHT/LEFT HEART CATH AND CORONARY ANGIOGRAPHY N/A 01/25/2017   Procedure: Right/Left Heart Cath and Coronary Angiography;  Surgeon: KTroy Sine MD;  [facility];  Service: Cardiovascular;  Laterality: N/A;   TEE WITHOUT CARDIOVERSION N/A 03/06/2017   Procedure: TRANSESOPHAGEAL ECHOCARDIOGRAM (TEE);  Surgeon: GGrace Isaac MD;  [facility]  Service: Open Heart Surgery;  Laterality: N/A;   TEE WITHOUT CARDIOVERSION N/A 04/14/2017   Procedure: TRANSESOPHAGEAL ECHOCARDIOGRAM (TEE);  Surgeon: MLarey Dresser MD;  [facility];  Service: Cardiovascular;  Laterality: N/A;    Current Outpatient Medications  Medication Sig Dispense Refill   amoxicillin (AMOXIL) 500 MG capsule Take 4 capsules (2,000 mg total) by mouth 1 hour prior to dental procedure 8 capsule PRN   apixaban (ELIQUIS) 5 MG TABS tablet Take 1 tablet (5 mg total) by mouth 2 (two) times daily. 180 tablet 1   Calcium Citrate 250 MG TABS Take 1 tablet by mouth every morning.     Cholecalciferol (VITAMIN D3) 50 MCG (2000 UT) capsule Take 2,000 Units by mouth daily.     levothyroxine (SYNTHROID) 125 MCG tablet Take 1 tablet (125 mcg total) by mouth in the morning on an empty stomach 90 tablet 0   Multiple Vitamins-Minerals (ICAPS AREDS 2 PO) Take 1 tablet by mouth 2 (two) times daily.     sildenafil (REVATIO) 20 MG tablet      No current facility-administered medications for this encounter.    No Known Allergies   Social History   Socioeconomic History   Marital status: Married    Spouse name: Not on file   Number of children: Not on file   Years of education: Not on file   Highest education level: Not on file  Occupational History   Not on file  Tobacco Use   Smoking status: Never   Smokeless tobacco: Never   Tobacco comments:    Never smoke 02/17/22  Vaping Use   Vaping Use: Never used  Substance and Sexual Activity   Alcohol use: Yes     Comment: 2 glasses of wine or 2 beers - 2-3 times/ week 02/17/22   Drug use: No   Sexual activity: Not Currently  Other Topics Concern   Not on file  Social History Narrative   ** Merged History Encounter **       Social Determinants of Health   Financial Resource Strain: Low Risk  (07/24/2019)   Overall Financial Resource Strain (CARDIA)    Difficulty of Paying Living Expenses: Not hard at all  Food Insecurity: No Food Insecurity (07/24/2019)   Hunger Vital Sign    Worried About Running Out of Food in the Last Year: Never true    RChesterin the Last Year: Never true  Transportation Needs: No Transportation Needs (07/24/2019)   PRAPARE - THydrologist(Medical): No    Lack of Transportation (Non-Medical): No  Physical Activity: Sufficiently Active (07/24/2019)   Exercise Vital Sign    Days of Exercise per Week: 4 days    Minutes of Exercise per Session: 40  min  Stress: No Stress Concern Present (07/24/2019)   [place]    Feeling of Stress : Only a little  Social Connections: Unknown (07/24/2019)   Social Connection and Isolation Panel [NHANES]    Frequency of Communication with Friends and Family: Three times a week    Frequency of Social Gatherings with Friends and Family: Twice a week    Attends Religious Services: Patient refused    Active Member of Clubs or Organizations: Patient refused    Attends Archivist Meetings: Patient refused    Marital Status: Married  Human resources officer Violence: Not At Risk (07/24/2019)   Humiliation, Afraid, Rape, and Kick questionnaire    Fear of Current or Ex-Partner: No    Emotionally Abused: No    Physically Abused: No    Sexually Abused: No     ROS- All systems are reviewed and negative except as per the HPI above.  Physical Exam: Vitals:   09/29/22 1417  BP: 138/80  Pulse: (!) 54  Weight: 61.2 kg  Height: '5\' 7"'$   (1.702 m)    GEN- The patient is a well appearing male, alert and oriented x 3 today.   HEENT-head normocephalic, atraumatic, sclera clear, conjunctiva pink, hearing intact, trachea midline. Lungs- Clear to ausculation bilaterally, normal work of breathing Heart- Regular rate and rhythm, no murmurs, rubs or gallops  GI- soft, NT, ND, + BS Extremities- no clubbing, cyanosis, or edema MS- no significant deformity or atrophy Skin- no rash or lesion Psych- euthymic mood, full affect Neuro- strength and sensation are intact   Wt Readings from Last 3 Encounters:  09/29/22 61.2 kg  06/07/22 59.4 kg  04/01/22 59.9 kg    EKG today demonstrates  SB Vent. rate 54 BPM PR interval 198 ms QRS duration 98 ms QT/QTcB 418/396 ms  Echo 01/25/22 demonstrated   1. Left ventricular ejection fraction, by estimation, is 70 to 75%. Left  ventricular ejection fraction by PLAX is 70 %. The left ventricle has  hyperdynamic function. The left ventricle has no regional wall motion  abnormalities. There is severe asymmetric left ventricular hypertrophy of the basal-septal segment. Left ventricular diastolic function could not be evaluated.   2. Right ventricular systolic function is mildly reduced. The right  ventricular size is normal. There is normal pulmonary artery systolic  pressure. The estimated right ventricular systolic pressure is 46.9 mmHg.   3. Left atrial size was severely dilated.   4. Right atrial size was mildly dilated.   5. The mitral valve is grossly normal. Mild mitral valve regurgitation.   6. The aortic valve has been repaired/replaced. Aortic valve  regurgitation is not visualized. There is a 25 mm Magna pericardial valve present in the aortic position. Echo findings are consistent with normal structure and function of the aortic valve  prosthesis. Aortic valve area, by VTI measures 1.51 cm. Aortic valve mean gradient measures 13.6 mmHg. Aortic valve Vmax measures 2.59 m/s.   7.  Aortic dilatation noted. There is mild dilatation of the aortic root,  measuring 43 mm. There is borderline dilatation of the ascending aorta, measuring 38 mm.   8. The inferior vena cava is normal in size with greater than 50%  respiratory variability, suggesting right atrial pressure of 3 mmHg.   Comparison(s): Changes from prior study are noted. 12/03/2020: LVEF 70-75%, bioprosthetic AVR gradient 25 mmHg.   Epic records are reviewed at length today  CHA2DS2-VASc Score = 2  The patient's  score is based upon: CHF History: 0 HTN History: 0 Diabetes History: 0 Stroke History: 0 Vascular Disease History: 1 Age Score: 1 Gender Score: 0       ASSESSMENT AND PLAN: 1. Persistent Atrial Fibrillation (ICD10:  I48.19) The patient's CHA2DS2-VASc score is 2, indicating a 2.2% annual risk of stroke.   Patient off AAD, in [place]. Scheduled for afib ablation with Dr Curt Bears on 10/18/22.  CT scan 10/11/22 Continue Eliquis 5 mg BID  2. Secondary Hypercoagulable State (ICD10:  D68.69) The patient is at significant risk for stroke/thromboembolism based upon his CHA2DS2-VASc Score of 2.  Continue Apixaban (Eliquis).   3. HCM Severe asymmetric hypertrophy of basal-septal segment  4. CAD No anginal symptoms.   Follow up for ablation as scheduled.    [facility] [address] [place], [place] [address] [phone] 09/29/2022 2:43 PM

## 2022-10-04 ENCOUNTER — Encounter: Payer: PPO | Admitting: Physical Therapy

## 2022-10-06 ENCOUNTER — Encounter: Payer: PPO | Admitting: Physical Therapy

## 2022-10-10 ENCOUNTER — Telehealth (HOSPITAL_COMMUNITY): Payer: Self-pay | Admitting: *Deleted

## 2022-10-10 NOTE — Telephone Encounter (Signed)
Reaching out to patient to offer assistance regarding upcoming cardiac imaging study; pt verbalizes understanding of appt date/time, parking situation and where to check in, pre-test NPO status  and verified current allergies; name and call back number provided for further questions should they arise ? ?Baeleigh Devincent RN Navigator Cardiac Imaging ? Heart and Vascular ?[phone] office ?[phone] cell ? ?

## 2022-10-11 ENCOUNTER — Ambulatory Visit (HOSPITAL_BASED_OUTPATIENT_CLINIC_OR_DEPARTMENT_OTHER)
Admission: RE | Admit: 2022-10-11 | Discharge: 2022-10-11 | Disposition: A | Discharge status: Home / Self Care | Payer: PPO | Source: Ambulatory Visit | Attending: Cardiology | Admitting: Cardiology

## 2022-10-11 DIAGNOSIS — I4819 Other persistent atrial fibrillation: Secondary | ICD-10-CM | POA: Diagnosis not present

## 2022-10-11 LAB — POCT I-STAT CREATININE: Creatinine, Ser: 0.9 mg/dL (ref 0.61–1.24)

## 2022-10-11 MED ORDER — IOHEXOL 350 MG/ML SOLN
100.0000 mL | Freq: Once | INTRAVENOUS | Status: AC | PRN
  Administered 2022-10-11: 80 mL via INTRAVENOUS

## 2022-10-17 NOTE — Pre-Procedure Instructions (Signed)
Attempted to call patient regarding procedure instructions for tomorrow.  Left voicemail on the following items: Arrival time 0800 Nothing to eat or drink after midnight No meds AM of procedure Responsible person to drive you home and stay with you for 24 hrs  Have you missed any doses of anti-coagulant Eliquis- if you have missed any doses please let office know right away.  Do not take eliquis morning of procedure.

## 2022-10-18 ENCOUNTER — Other Ambulatory Visit: Payer: Self-pay

## 2022-10-18 ENCOUNTER — Ambulatory Visit (HOSPITAL_BASED_OUTPATIENT_CLINIC_OR_DEPARTMENT_OTHER): Payer: PPO | Admitting: Certified Registered Nurse Anesthetist

## 2022-10-18 ENCOUNTER — Ambulatory Visit (HOSPITAL_COMMUNITY)
Admission: RE | Admit: 2022-10-18 | Discharge: 2022-10-18 | Disposition: A | Discharge status: Home / Self Care | Payer: PPO | Attending: Cardiology | Admitting: Cardiology

## 2022-10-18 ENCOUNTER — Ambulatory Visit (HOSPITAL_COMMUNITY): Payer: PPO | Admitting: Certified Registered Nurse Anesthetist

## 2022-10-18 ENCOUNTER — Encounter (HOSPITAL_COMMUNITY)
Admission: RE | Disposition: A | Discharge status: Home / Self Care | Payer: Self-pay | Source: Home / Self Care | Attending: Cardiology

## 2022-10-18 ENCOUNTER — Encounter (HOSPITAL_COMMUNITY): Payer: Self-pay | Admitting: Cardiology

## 2022-10-18 DIAGNOSIS — Z8249 Family history of ischemic heart disease and other diseases of the circulatory system: Secondary | ICD-10-CM | POA: Diagnosis not present

## 2022-10-18 DIAGNOSIS — I4892 Unspecified atrial flutter: Secondary | ICD-10-CM | POA: Insufficient documentation

## 2022-10-18 DIAGNOSIS — I4891 Unspecified atrial fibrillation: Secondary | ICD-10-CM

## 2022-10-18 DIAGNOSIS — I483 Typical atrial flutter: Secondary | ICD-10-CM

## 2022-10-18 DIAGNOSIS — I2510 Atherosclerotic heart disease of native coronary artery without angina pectoris: Secondary | ICD-10-CM | POA: Diagnosis not present

## 2022-10-18 DIAGNOSIS — Z7901 Long term (current) use of anticoagulants: Secondary | ICD-10-CM | POA: Diagnosis not present

## 2022-10-18 DIAGNOSIS — Z952 Presence of prosthetic heart valve: Secondary | ICD-10-CM | POA: Diagnosis not present

## 2022-10-18 DIAGNOSIS — I4819 Other persistent atrial fibrillation: Secondary | ICD-10-CM

## 2022-10-18 DIAGNOSIS — I712 Thoracic aortic aneurysm, without rupture, unspecified: Secondary | ICD-10-CM | POA: Insufficient documentation

## 2022-10-18 DIAGNOSIS — M5412 Radiculopathy, cervical region: Secondary | ICD-10-CM | POA: Diagnosis not present

## 2022-10-18 DIAGNOSIS — I480 Paroxysmal atrial fibrillation: Secondary | ICD-10-CM | POA: Diagnosis not present

## 2022-10-18 DIAGNOSIS — D6869 Other thrombophilia: Secondary | ICD-10-CM | POA: Insufficient documentation

## 2022-10-18 HISTORY — PX: ATRIAL FIBRILLATION ABLATION: EP1191

## 2022-10-18 LAB — CBC
HCT: 42.4 % (ref 39.0–52.0)
Hemoglobin: 14.5 g/dL (ref 13.0–17.0)
MCH: 33.5 pg (ref 26.0–34.0)
MCHC: 34.2 g/dL (ref 30.0–36.0)
MCV: 97.9 fL (ref 80.0–100.0)
Platelets: 128 10*3/uL — ABNORMAL LOW (ref 150–400)
RBC: 4.33 MIL/uL (ref 4.22–5.81)
RDW: 12.2 % (ref 11.5–15.5)
WBC: 5.3 10*3/uL (ref 4.0–10.5)
nRBC: 0 % (ref 0.0–0.2)

## 2022-10-18 LAB — BASIC METABOLIC PANEL
Anion gap: 9 (ref 5–15)
BUN: 24 mg/dL — ABNORMAL HIGH (ref 8–23)
CO2: 27 mmol/L (ref 22–32)
Calcium: 8.9 mg/dL (ref 8.9–10.3)
Chloride: 104 mmol/L (ref 98–111)
Creatinine, Ser: 0.82 mg/dL (ref 0.61–1.24)
GFR, Estimated: >60 mL/min (ref >60)
Glucose, Bld: 103 mg/dL — ABNORMAL HIGH (ref 70–99)
Potassium: 4.2 mmol/L (ref 3.5–5.1)
Sodium: 140 mmol/L (ref 135–145)

## 2022-10-18 LAB — POCT ACTIVATED CLOTTING TIME
Activated Clotting Time: 277 seconds
Activated Clotting Time: 309 seconds
Activated Clotting Time: 336 seconds

## 2022-10-18 SURGERY — ATRIAL FIBRILLATION ABLATION
Anesthesia: General

## 2022-10-18 MED ORDER — LIDOCAINE 2% (20 MG/ML) 5 ML SYRINGE
INTRAMUSCULAR | Status: DC | PRN
  Administered 2022-10-18: 60 mg via INTRAVENOUS

## 2022-10-18 MED ORDER — HEPARIN SODIUM (PORCINE) 1000 UNIT/ML IJ SOLN
INTRAMUSCULAR | Status: AC
  Filled 2022-10-18: qty 10

## 2022-10-18 MED ORDER — SODIUM CHLORIDE 0.9 % IV SOLN: INTRAVENOUS | Status: DC

## 2022-10-18 MED ORDER — LACTATED RINGERS IV SOLN: INTRAVENOUS | Status: DC | PRN

## 2022-10-18 MED ORDER — PROTAMINE SULFATE 10 MG/ML IV SOLN
INTRAVENOUS | Status: DC | PRN
  Administered 2022-10-18: 40 mg via INTRAVENOUS

## 2022-10-18 MED ORDER — AMISULPRIDE (ANTIEMETIC) 5 MG/2ML IV SOLN
10.0000 mg | Freq: Once | INTRAVENOUS | Status: DC | PRN
  Filled 2022-10-18: qty 4

## 2022-10-18 MED ORDER — ROCURONIUM BROMIDE 10 MG/ML (PF) SYRINGE
PREFILLED_SYRINGE | INTRAVENOUS | Status: DC | PRN
  Administered 2022-10-18: 50 mg via INTRAVENOUS
  Administered 2022-10-18: 30 mg via INTRAVENOUS
  Administered 2022-10-18: 20 mg via INTRAVENOUS

## 2022-10-18 MED ORDER — SUGAMMADEX SODIUM 200 MG/2ML IV SOLN
INTRAVENOUS | Status: DC | PRN
  Administered 2022-10-18: 122.4 mg via INTRAVENOUS

## 2022-10-18 MED ORDER — SODIUM CHLORIDE 0.9% FLUSH: 3.0000 mL | INTRAVENOUS | Status: DC | PRN

## 2022-10-18 MED ORDER — PROPOFOL 500 MG/50ML IV EMUL
INTRAVENOUS | Status: DC | PRN
  Administered 2022-10-18 (×3): 20 mg via INTRAVENOUS
  Administered 2022-10-18: 100 ug/kg/min via INTRAVENOUS

## 2022-10-18 MED ORDER — SODIUM CHLORIDE 0.9 % IV SOLN: 250.0000 mL | INTRAVENOUS | Status: DC | PRN

## 2022-10-18 MED ORDER — PROPOFOL 10 MG/ML IV BOLUS
INTRAVENOUS | Status: DC | PRN
  Administered 2022-10-18: 100 mg via INTRAVENOUS

## 2022-10-18 MED ORDER — DEXAMETHASONE SODIUM PHOSPHATE 10 MG/ML IJ SOLN
INTRAMUSCULAR | Status: DC | PRN
  Administered 2022-10-18: 10 mg via INTRAVENOUS

## 2022-10-18 MED ORDER — ONDANSETRON HCL 4 MG/2ML IJ SOLN: 4.0000 mg | Freq: Four times a day (QID) | INTRAMUSCULAR | Status: DC | PRN

## 2022-10-18 MED ORDER — FENTANYL CITRATE (PF) 100 MCG/2ML IJ SOLN: 25.0000 ug | INTRAMUSCULAR | Status: DC | PRN

## 2022-10-18 MED ORDER — HEPARIN SODIUM (PORCINE) 1000 UNIT/ML IJ SOLN
INTRAMUSCULAR | Status: DC | PRN
  Administered 2022-10-18: 1000 [IU] via INTRAVENOUS

## 2022-10-18 MED ORDER — ACETAMINOPHEN 325 MG PO TABS: 650.0000 mg | ORAL_TABLET | ORAL | Status: DC | PRN

## 2022-10-18 MED ORDER — FENTANYL CITRATE (PF) 250 MCG/5ML IJ SOLN
INTRAMUSCULAR | Status: DC | PRN
  Administered 2022-10-18: 100 ug via INTRAVENOUS

## 2022-10-18 MED ORDER — HEPARIN SODIUM (PORCINE) 1000 UNIT/ML IJ SOLN
INTRAMUSCULAR | Status: DC | PRN
  Administered 2022-10-18: 14000 [IU] via INTRAVENOUS
  Administered 2022-10-18: 1000 [IU] via INTRAVENOUS
  Administered 2022-10-18: 3000 [IU] via INTRAVENOUS
  Administered 2022-10-18: 5000 [IU] via INTRAVENOUS

## 2022-10-18 MED ORDER — ONDANSETRON HCL 4 MG/2ML IJ SOLN
INTRAMUSCULAR | Status: DC | PRN
  Administered 2022-10-18: 4 mg via INTRAVENOUS

## 2022-10-18 MED ORDER — HEPARIN (PORCINE) IN NACL 1000-0.9 UT/500ML-% IV SOLN
INTRAVENOUS | Status: DC | PRN
  Administered 2022-10-18 (×3): 500 mL

## 2022-10-18 SURGICAL SUPPLY — 21 items
BAG SNAP BAND KOVER 36X36 (MISCELLANEOUS) IMPLANT
BLANKET WARM UNDERBOD FULL ACC (MISCELLANEOUS) ×1 IMPLANT
CATH ABLAT QDOT MICRO BI TC DF (CATHETERS) IMPLANT
CATH OCTARAY 2.0 F 3-3-3-3-3 (CATHETERS) IMPLANT
CATH PIGTAIL STEERABLE D1 8.7 (WIRE) IMPLANT
CATH S-M CIRCA TEMP PROBE (CATHETERS) IMPLANT
CATH SOUNDSTAR ECO 8FR (CATHETERS) IMPLANT
CATH WEB BI DIR CSDF CRV REPRO (CATHETERS) IMPLANT
CLOSURE PERCLOSE PROSTYLE (VASCULAR PRODUCTS) IMPLANT
COVER SWIFTLINK CONNECTOR (BAG) ×1 IMPLANT
KIT VERSACROSS STEERABLE D1 (CATHETERS) IMPLANT
PACK EP LATEX FREE (CUSTOM PROCEDURE TRAY) ×1
PACK EP LF (CUSTOM PROCEDURE TRAY) ×1 IMPLANT
PAD DEFIB RADIO PHYSIO CONN (PAD) ×1 IMPLANT
PATCH CARTO3 (PAD) IMPLANT
SHEATH CARTO VIZIGO SM CVD (SHEATH) IMPLANT
SHEATH PINNACLE 7F 10CM (SHEATH) IMPLANT
SHEATH PINNACLE 8F 10CM (SHEATH) IMPLANT
SHEATH PINNACLE 9F 10CM (SHEATH) IMPLANT
SHEATH PROBE COVER 6X72 (BAG) IMPLANT
TUBING SMART ABLATE COOLFLOW (TUBING) IMPLANT

## 2022-10-18 NOTE — Discharge Instructions (Signed)
Post procedure care instructions No driving for 4 days. No lifting over 5 lbs for 1 week. No vigorous or sexual activity for 1 week. You may return to work/your usual activities on 10/26/22. Keep procedure site clean & dry. If you notice increased pain, swelling, bleeding or pus, call/return!  You may shower after 24 hours, but no soaking in baths/hot tubs/pools for 1 week.    You have an appointment set up with the [facility].  Multiple studies have shown that being followed by a dedicated atrial fibrillation clinic in addition to the standard care you receive from your other physicians improves health. We believe that enrollment in the atrial fibrillation clinic will allow [place] to better care for you.   The phone number to the [facility] is [phone]. The clinic is staffed Monday through Friday from 8:30am to 5pm.  Directions: The clinic is located in the [facility] [place] the hospital at the MAIN ENTRANCE "A", use [address] to the 6th floor.  Registration desk to the right of elevators on 6th floor  If you have any trouble locating the clinic, please don't hesitate to call [phone].

## 2022-10-18 NOTE — Anesthesia Preprocedure Evaluation (Signed)
Anesthesia Evaluation  Patient identified by MRN, date of birth, ID band Patient awake    Reviewed: Allergy & Precautions, NPO status , Patient's Chart, lab work & pertinent test results  History of Anesthesia Complications Negative for: history of anesthetic complications  Airway Mallampati: II  TM Distance: >3 FB Neck ROM: Full    Dental no notable dental hx. (+) Dental Advisory Given   Pulmonary neg pulmonary ROS   breath sounds clear to auscultation       Cardiovascular + CAD  + dysrhythmias Atrial Fibrillation + Valvular Problems/Murmurs (s/p AVR in 2018)  Rhythm:Irregular Rate:Normal + Systolic murmurs Thoracic aortic aneurysm S/P AVR Echo 01/2022: IMPRESSIONS    1. Left ventricular ejection fraction, by estimation, is 70 to 75%. Left  ventricular ejection fraction by PLAX is 70 %. The left ventricle has  hyperdynamic function. The left ventricle has no regional wall motion  abnormalities. There is severe  asymmetric left ventricular hypertrophy of the basal-septal segment. Left  ventricular diastolic function could not be evaluated.  2. Right ventricular systolic function is mildly reduced. The right  ventricular size is normal. There is normal pulmonary artery systolic  pressure. The estimated right ventricular systolic pressure is 03.0 mmHg.  3. Left atrial size was severely dilated.  4. Right atrial size was mildly dilated.  5. The mitral valve is grossly normal. Mild mitral valve regurgitation.  6. The aortic valve has been repaired/replaced. Aortic valve  regurgitation is not visualized. There is a 25 mm Magna pericardial valve  present in the aortic position. Echo findings are consistent with normal  structure and function of the aortic valve  prosthesis. Aortic valve area, by VTI measures 1.51 cm. Aortic valve mean  gradient measures 13.6 mmHg. Aortic valve Vmax measures 2.59 m/s.  7. Aortic dilatation  noted. There is mild dilatation of the aortic root,  measuring 43 mm. There is borderline dilatation of the ascending aorta,  measuring 38 mm.  8. The inferior vena cava is normal in size with greater than 50%  respiratory variability, suggesting right atrial pressure of 3 mmHg.    Neuro/Psych  Neuromuscular disease (cervical radiculopathy)    GI/Hepatic negative GI ROS, Neg liver ROS,,,  Endo/Other  negative endocrine ROS    Renal/GU negative Renal ROS     Musculoskeletal negative musculoskeletal ROS (+)    Abdominal   Peds  Hematology negative hematology ROS (+)   Anesthesia Other Findings   Reproductive/Obstetrics                             Anesthesia Physical Anesthesia Plan  ASA: 3  Anesthesia Plan: General   Post-op Pain Management: Tylenol PO (pre-op)*   Induction: Intravenous  PONV Risk Score and Plan: 2 and Treatment may vary due to age or medical condition, Dexamethasone, Ondansetron and TIVA  Airway Management Planned: Oral ETT  Additional Equipment: None  Intra-op Plan:   Post-operative Plan: Extubation in OR  Informed Consent: I have reviewed the patients History and Physical, chart, labs and discussed the procedure including the risks, benefits and alternatives for the proposed anesthesia with the patient or authorized representative who has indicated his/her understanding and acceptance.     Dental advisory given  Plan Discussed with: Anesthesiologist and CRNA  Anesthesia Plan Comments:         Anesthesia Quick Evaluation

## 2022-10-18 NOTE — Anesthesia Procedure Notes (Signed)
Procedure Name: Intubation Date/Time: 10/18/2022 10:25 AM  Performed by: Minerva Ends, CRNAPre-anesthesia Checklist: Patient identified, Emergency Drugs available, Suction available and Patient being monitored Patient Re-evaluated:Patient Re-evaluated prior to induction Oxygen Delivery Method: Circle system utilized Preoxygenation: Pre-oxygenation with 100% oxygen Induction Type: IV induction Ventilation: Mask ventilation without difficulty Laryngoscope Size: Mac and 3 Grade View: Grade I Tube type: Oral Tube size: 7.0 mm Number of attempts: 1 Airway Equipment and Method: Stylet and Oral airway Placement Confirmation: ETT inserted through vocal cords under direct vision, positive ETCO2 and breath sounds checked- equal and bilateral Secured at: 23 cm Tube secured with: Tape Dental Injury: Teeth and Oropharynx as per pre-operative assessment

## 2022-10-18 NOTE — Transfer of Care (Signed)
Immediate Anesthesia Transfer of Care Note  Patient: James Mayo  Procedure(s) Performed: ATRIAL FIBRILLATION ABLATION  Patient Location: Cath Lab  Anesthesia Type:General  Level of Consciousness: confused  Airway & Oxygen Therapy: Patient Spontanous Breathing  Post-op Assessment: Report given to RN and Post -op Vital signs reviewed and stable  Post vital signs: Reviewed and stable  Last Vitals:  Vitals Value Taken Time  BP 112/69 10/18/22 1245  Temp 98   Pulse 48 10/18/22 1248  Resp 18 10/18/22 1248  SpO2 93 % 10/18/22 1248  Vitals shown include unvalidated device data.  Last Pain:  Vitals:   10/18/22 0934  TempSrc:   PainSc: 0-No pain         Complications: There were no known notable events for this encounter.

## 2022-10-18 NOTE — Progress Notes (Addendum)
Pts BR complete.  Ambulated pt to BR and hall.  OOzing from (L) groin site.  PT back to bed lying flat.  Pressure  held x 15 min.  Redressed (L) groin with 4x4 and tegederm.  Pt returned to BR x 61mn.  Dr CCurt Bearsaware

## 2022-10-18 NOTE — Interval H&P Note (Signed)
History and Physical Interval Note:  10/18/2022 9:47 AM  James Mayo  has presented today for surgery, with the diagnosis of afib.  The various methods of treatment have been discussed with the patient and family. After consideration of risks, benefits and other options for treatment, the patient has consented to  Procedure(s): ATRIAL FIBRILLATION ABLATION (N/A) as a surgical intervention.  The patient's history has been reviewed, patient examined, no change in status, stable for surgery.  I have reviewed the patient's chart and labs.  Questions were answered to the patient's satisfaction.     Jalayia Bagheri [employer]

## 2022-10-18 NOTE — Progress Notes (Signed)
Pt ambulated in hallway again.  No further oozing.  Ok for discharge per Dr Curt Bears.  Discharge instructions given to pt and wife who verbalize understanding and deny further questions.  Bilateral groin sites stable.

## 2022-10-18 NOTE — Anesthesia Postprocedure Evaluation (Signed)
Anesthesia Post Note  Patient: James Mayo  Procedure(s) Performed: ATRIAL FIBRILLATION ABLATION     Patient location during evaluation: PACU Anesthesia Type: General Level of consciousness: awake and alert Pain management: pain level controlled Vital Signs Assessment: post-procedure vital signs reviewed and stable Respiratory status: spontaneous breathing, nonlabored ventilation, respiratory function stable and patient connected to nasal cannula oxygen Cardiovascular status: blood pressure returned to baseline and stable Postop Assessment: no apparent nausea or vomiting Anesthetic complications: no  There were no known notable events for this encounter.  Last Vitals:  Vitals:   10/18/22 1600 10/18/22 1707  BP: 126/68 126/77  Pulse: 62   Resp: 16   Temp:    SpO2: 94% 100%    Last Pain:  Vitals:   10/18/22 1343  TempSrc:   PainSc: 0-No pain                 Tiajuana Amass

## 2022-11-01 DIAGNOSIS — Z0000 Encounter for general adult medical examination without abnormal findings: Secondary | ICD-10-CM | POA: Diagnosis not present

## 2022-11-01 DIAGNOSIS — Z6821 Body mass index (BMI) 21.0-21.9, adult: Secondary | ICD-10-CM | POA: Diagnosis not present

## 2022-11-01 DIAGNOSIS — Z1389 Encounter for screening for other disorder: Secondary | ICD-10-CM | POA: Diagnosis not present

## 2022-11-09 ENCOUNTER — Other Ambulatory Visit (HOSPITAL_COMMUNITY): Payer: Self-pay

## 2022-11-09 DIAGNOSIS — N529 Male erectile dysfunction, unspecified: Secondary | ICD-10-CM | POA: Diagnosis not present

## 2022-11-09 DIAGNOSIS — Z6821 Body mass index (BMI) 21.0-21.9, adult: Secondary | ICD-10-CM | POA: Diagnosis not present

## 2022-11-09 DIAGNOSIS — I480 Paroxysmal atrial fibrillation: Secondary | ICD-10-CM | POA: Diagnosis not present

## 2022-11-09 DIAGNOSIS — D696 Thrombocytopenia, unspecified: Secondary | ICD-10-CM | POA: Diagnosis not present

## 2022-11-09 DIAGNOSIS — I700 Atherosclerosis of aorta: Secondary | ICD-10-CM | POA: Diagnosis not present

## 2022-11-09 DIAGNOSIS — E039 Hypothyroidism, unspecified: Secondary | ICD-10-CM | POA: Diagnosis not present

## 2022-11-09 DIAGNOSIS — M8580 Other specified disorders of bone density and structure, unspecified site: Secondary | ICD-10-CM | POA: Diagnosis not present

## 2022-11-09 MED ORDER — SILDENAFIL CITRATE 20 MG PO TABS
100.0000 mg | ORAL_TABLET | Freq: Every day | ORAL | 0 refills | Status: DC
  Filled 2022-11-09: qty 30, 6d supply, fill #0

## 2022-11-10 ENCOUNTER — Other Ambulatory Visit (HOSPITAL_COMMUNITY): Payer: Self-pay

## 2022-11-15 ENCOUNTER — Ambulatory Visit (HOSPITAL_COMMUNITY)
Admission: RE | Admit: 2022-11-15 | Discharge: 2022-11-15 | Disposition: A | Discharge status: Home / Self Care | Payer: PPO | Source: Ambulatory Visit | Attending: Physician Assistant | Admitting: Physician Assistant

## 2022-11-15 VITALS — BP 138/86 | HR 53 | Ht 67.0 in | Wt 134.6 lb

## 2022-11-15 DIAGNOSIS — D6869 Other thrombophilia: Secondary | ICD-10-CM | POA: Diagnosis not present

## 2022-11-15 DIAGNOSIS — I4892 Unspecified atrial flutter: Secondary | ICD-10-CM | POA: Diagnosis not present

## 2022-11-15 DIAGNOSIS — I4819 Other persistent atrial fibrillation: Secondary | ICD-10-CM | POA: Diagnosis not present

## 2022-11-15 DIAGNOSIS — I2510 Atherosclerotic heart disease of native coronary artery without angina pectoris: Secondary | ICD-10-CM | POA: Insufficient documentation

## 2022-11-15 NOTE — Progress Notes (Signed)
Primary Care Physician: Kathyrn Lass, MD Primary Cardiologist: Dr Irish Lack  Primary Electrophysiologist: Dr Curt Bears  Referring Physician: Dr Antony Madura is a 72 y.o. male with a history of nonobstructive CAD, thoracic aortic aneurysm s/p aortic root and valve replacement, atrial flutter, atrial fibrillation who presents for follow up in the [facility]. The patient was initially diagnosed with atrial fibrillation in 2018 postoperatively following his aortic valve replacement. He underwent DCCV at that time. He was seen by Dr Irish Lack on 01/06/22 when the patient noted afib on his Apple Watch. He did have symptoms of decreased exercise tolerance. He was started on Eliquis for a CHADS2VASC score of 2 and had DCCV on 02/03/22. Unfortunately, he was back in afib about 6 days later. Started on amiodarone 02/17/22. Patient is s/p DCCV on 03/21/22. Amiodarone was eventually discontinued due to intolerable side effects of dizziness and fatigue.   On follow up today, patient is s/p afib and flutter ablation with Dr Curt Bears on 10/18/22. Patient reports that he has done well since his last visit with no episodes of afib. He denies chest pain, swallowing pain, or groin issues.   Today, he denies symptoms of palpitations, chest pain, shortness of breath, orthopnea, PND, lower extremity edema, dizziness, presyncope, syncope, snoring, daytime somnolence, bleeding, or neurologic sequela. The patient is tolerating medications without difficulties and is otherwise without complaint today.    Atrial Fibrillation Risk Factors:  he does not have symptoms or diagnosis of sleep apnea. he does not have a history of rheumatic fever. he does have a history of alcohol use.   he has a BMI of Body mass index is 21.08 kg/m.Marland Kitchen Filed Weights   11/15/22 1313  Weight: 61.1 kg    Family History  Problem Relation Age of Onset   Hypertension Mother    Mental illness Brother       Atrial Fibrillation Management history:  Previous antiarrhythmic drugs: amiodarone  Previous cardioversions: 2018, 02/03/22, 03/21/22 Previous ablations: 10/18/22 CHADS2VASC score: 2 Anticoagulation history: Eliquis   Past Medical History:  Diagnosis Date   Aortic valve disorder 03/06/2017    S/p AVR with Edwards pericardial tissue valve and replacement of the ascending aorta with a 32 mm Hemashield Side Arm Graft   BASAL CELL CARCINOMA, FACE 08/19/2009   Qualifier: Diagnosis of By: Oneida Alar MD, KARL     BASAL CELL CARCINOMA, FACE 08/19/2009   Qualifier: Diagnosis of  By: Oneida Alar MD, KARL     Coronary artery disease    Metatarsalgia of left foot 04/29/2014   Neck pain, bilateral 11/08/2013   PAF (paroxysmal atrial fibrillation) ([place]) 03/09/2017   RHINITIS, ALLERGIC 11/09/2006   Qualifier: Diagnosis of  By: Samara Snide     SHOULDER PAIN, RIGHT 05/06/2009   Qualifier: Diagnosis of  By: Oneida Alar MD, KARL     Thoracic ascending aortic aneurysm ([place])    Thyroid condition    Past Surgical History:  Procedure Laterality Date   AORTIC VALVE REPLACEMENT  03/06/2017   Procedure: AORTIC VALVE REPLACEMENT (AVR) using Magna Ease valve Size 23m;  Surgeon: GGrace Isaac MD;  [facility]  Service: Open Heart Surgery;;   ATRIAL FIBRILLATION ABLATION N/A 10/18/2022   Procedure: ATRIAL FIBRILLATION ABLATION;  Surgeon: CConstance Haw MD;  [facility];  Service: Cardiovascular;  Laterality: N/A;   CARDIAC CATHETERIZATION     CARDIOVERSION N/A 04/14/2017   Procedure: CARDIOVERSION;  Surgeon: MLarey Dresser MD;  Location:  [place] ENDOSCOPY;  Service: Cardiovascular;  Laterality: N/A;   CARDIOVERSION N/A 02/03/2022   Procedure: CARDIOVERSION;  Surgeon: Buford Dresser, MD;  [facility]: [facility] ENDOSCOPY;  Service: Cardiovascular;  Laterality: N/A;   CARDIOVERSION N/A 03/21/2022   Procedure: CARDIOVERSION;  Surgeon: Geralynn Rile, MD;  Location: [place];  Service:  Cardiovascular;  Laterality: N/A;   EYE SURGERY Bilateral    LASIK 10 years   REPLACEMENT ASCENDING AORTA N/A 03/06/2017   Procedure: REPLACEMENT ASCENDING AORTA using 60m Hemashield Graft - with Hypothermic Circulatory Arrest;  Surgeon: GGrace Isaac MD;  [facility]  Service: Open Heart Surgery;  Laterality: N/A;   RIGHT/LEFT HEART CATH AND CORONARY ANGIOGRAPHY N/A 01/25/2017   Procedure: Right/Left Heart Cath and Coronary Angiography;  Surgeon: KTroy Sine MD;  [facility];  Service: Cardiovascular;  Laterality: N/A;   TEE WITHOUT CARDIOVERSION N/A 03/06/2017   Procedure: TRANSESOPHAGEAL ECHOCARDIOGRAM (TEE);  Surgeon: GGrace Isaac MD;  [facility]  Service: Open Heart Surgery;  Laterality: N/A;   TEE WITHOUT CARDIOVERSION N/A 04/14/2017   Procedure: TRANSESOPHAGEAL ECHOCARDIOGRAM (TEE);  Surgeon: MLarey Dresser MD;  [facility];  Service: Cardiovascular;  Laterality: N/A;    Current Outpatient Medications  Medication Sig Dispense Refill   amoxicillin (AMOXIL) 500 MG capsule Take 4 capsules (2,000 mg total) by mouth 1 hour prior to dental procedure 8 capsule PRN   apixaban (ELIQUIS) 5 MG TABS tablet Take 1 tablet (5 mg total) by mouth 2 (two) times daily. 180 tablet 1   Calcium Citrate 250 MG TABS Take 1 tablet by mouth every morning.     Cholecalciferol (VITAMIN D3) 50 MCG (2000 UT) capsule Take 2,000 Units by mouth daily.     levothyroxine (SYNTHROID) 125 MCG tablet Take 1 tablet (125 mcg total) by mouth in the morning on an empty stomach 90 tablet 0   Multiple Vitamins-Minerals (ICAPS AREDS 2 PO) Take 1 capsule by mouth 2 (two) times daily.     sildenafil (REVATIO) 20 MG tablet Take 20 mg by mouth as needed (for ED).     No current facility-administered medications for this encounter.    No Known Allergies   Social History   Socioeconomic History   Marital status: Married    Spouse name: Not on file   Number of children:  Not on file   Years of education: Not on file   Highest education level: Not on file  Occupational History   Not on file  Tobacco Use   Smoking status: Never   Smokeless tobacco: Never   Tobacco comments:    Never smoke 02/17/22  Vaping Use   Vaping Use: Never used  Substance and Sexual Activity   Alcohol use: Yes    Comment: 2 glasses of wine or 2 beers - 2-3 times/ week 02/17/22   Drug use: No   Sexual activity: Not Currently  Other Topics Concern   Not on file  Social History Narrative   ** Merged History Encounter **       Social Determinants of Health   Financial Resource Strain: Low Risk  (07/24/2019)   Overall Financial Resource Strain (CARDIA)    Difficulty of Paying Living Expenses: Not hard at all  Food Insecurity: No Food Insecurity (07/24/2019)   Hunger Vital Sign    Worried About Running Out of Food in the Last Year: Never true    Ran Out of Food in the Last Year: Never true  Transportation Needs: No  Transportation Needs (07/24/2019)   PRAPARE - Hydrologist (Medical): No    Lack of Transportation (Non-Medical): No  Physical Activity: Sufficiently Active (07/24/2019)   Exercise Vital Sign    Days of Exercise per Week: 4 days    Minutes of Exercise per Session: 40 min  Stress: No Stress Concern Present (07/24/2019)   [place] [address]    Feeling of Stress : Only a little  Social Connections: Unknown (07/24/2019)   Social Connection and Isolation Panel [NHANES]    Frequency of Communication with Friends and Family: Three times a week    Frequency of Social Gatherings with Friends and Family: Twice a week    Attends Religious Services: Patient refused    Active Member of Clubs or Organizations: Patient refused    Attends Archivist Meetings: Patient refused    Marital Status: Married  Human resources officer Violence: Not At Risk (07/24/2019)   Humiliation, Afraid,  Rape, and Kick questionnaire    Fear of Current or Ex-Partner: No    Emotionally Abused: No    Physically Abused: No    Sexually Abused: No     ROS- All systems are reviewed and negative except as per the HPI above.  Physical Exam: Vitals:   11/15/22 1313  BP: 138/86  Pulse: (!) 53  Weight: 61.1 kg  Height: '5\' 7"'$  (1.702 m)    GEN- The patient is a well appearing male, alert and oriented x 3 today.   HEENT-head normocephalic, atraumatic, sclera clear, conjunctiva pink, hearing intact, trachea midline. Lungs- Clear to ausculation bilaterally, normal work of breathing Heart- Regular rate and rhythm, no rubs or gallops, 3/6 systolic murmur   GI- soft, NT, ND, + BS Extremities- no clubbing, cyanosis, or edema MS- no significant deformity or atrophy Skin- no rash or lesion Psych- euthymic mood, full affect Neuro- strength and sensation are intact   Wt Readings from Last 3 Encounters:  11/15/22 61.1 kg  10/18/22 61.2 kg  09/29/22 61.2 kg    EKG today demonstrates  SB, PAC Vent. rate 53 BPM PR interval 194 ms QRS duration 98 ms QT/QTcB 440/412 ms  Echo 01/25/22 demonstrated   1. Left ventricular ejection fraction, by estimation, is 70 to 75%. Left  ventricular ejection fraction by PLAX is 70 %. The left ventricle has  hyperdynamic function. The left ventricle has no regional wall motion  abnormalities. There is severe asymmetric left ventricular hypertrophy of the basal-septal segment. Left ventricular diastolic function could not be evaluated.   2. Right ventricular systolic function is mildly reduced. The right  ventricular size is normal. There is normal pulmonary artery systolic  pressure. The estimated right ventricular systolic pressure is 123XX123 mmHg.   3. Left atrial size was severely dilated.   4. Right atrial size was mildly dilated.   5. The mitral valve is grossly normal. Mild mitral valve regurgitation.   6. The aortic valve has been repaired/replaced. Aortic  valve  regurgitation is not visualized. There is a 25 mm Magna pericardial valve present in the aortic position. Echo findings are consistent with normal structure and function of the aortic valve  prosthesis. Aortic valve area, by VTI measures 1.51 cm. Aortic valve mean gradient measures 13.6 mmHg. Aortic valve Vmax measures 2.59 m/s.   7. Aortic dilatation noted. There is mild dilatation of the aortic root,  measuring 43 mm. There is borderline dilatation of the ascending aorta, measuring 38 mm.  8. The inferior vena cava is normal in size with greater than 50%  respiratory variability, suggesting right atrial pressure of 3 mmHg.   Comparison(s): Changes from prior study are noted. 12/03/2020: LVEF 70-75%, bioprosthetic AVR gradient 25 mmHg.   Epic records are reviewed at length today  CHA2DS2-VASc Score = 2  The patient's score is based upon: CHF History: 0 HTN History: 0 Diabetes History: 0 Stroke History: 0 Vascular Disease History: 1 Age Score: 1 Gender Score: 0       ASSESSMENT AND PLAN: 1. Persistent Atrial Fibrillation/atrial flutter The patient's CHA2DS2-VASc score is 2, indicating a 2.2% annual risk of stroke.   S/p afib and flutter ablation 10/18/22 Patient appears to be maintaining SR.  Continue Eliquis 5 mg BID with no missed doses for 3 months post ablation. Patient inquiring about stopping anticoagulation due to concern about bleeding risk while biking. He does have severe asymmetric left ventricular hypertrophy of the basal-septal segment on echo. Will defer to EP at 3 month mark post ablation.   2. Secondary Hypercoagulable State (ICD10:  D68.69) The patient is at significant risk for stroke/thromboembolism based upon his CHA2DS2-VASc Score of 2.  Continue Apixaban (Eliquis).   3. CAD CAC score 413 No anginal symptoms.   Follow up with Dr Curt Bears as scheduled.    [facility] [address] [place], [place]  [address] [phone] 11/15/2022 1:45 PM

## 2022-11-29 DIAGNOSIS — M8589 Other specified disorders of bone density and structure, multiple sites: Secondary | ICD-10-CM | POA: Diagnosis not present

## 2022-11-30 ENCOUNTER — Other Ambulatory Visit (HOSPITAL_COMMUNITY): Payer: Self-pay

## 2022-11-30 MED ORDER — LEVOTHYROXINE SODIUM 125 MCG PO TABS
125.0000 ug | ORAL_TABLET | Freq: Every morning | ORAL | 3 refills | Status: DC
  Filled 2022-11-30: qty 90, 90d supply, fill #0
  Filled 2023-02-13: qty 90, 90d supply, fill #1

## 2023-01-03 ENCOUNTER — Other Ambulatory Visit: Payer: Self-pay | Admitting: Physician Assistant

## 2023-01-03 ENCOUNTER — Other Ambulatory Visit (HOSPITAL_COMMUNITY): Payer: Self-pay

## 2023-01-03 DIAGNOSIS — I480 Paroxysmal atrial fibrillation: Secondary | ICD-10-CM

## 2023-01-03 MED ORDER — APIXABAN 5 MG PO TABS
5.0000 mg | ORAL_TABLET | Freq: Two times a day (BID) | ORAL | 1 refills | Status: DC
  Filled 2023-01-03: qty 180, 90d supply, fill #0

## 2023-01-04 DIAGNOSIS — D485 Neoplasm of uncertain behavior of skin: Secondary | ICD-10-CM | POA: Diagnosis not present

## 2023-01-04 DIAGNOSIS — L821 Other seborrheic keratosis: Secondary | ICD-10-CM | POA: Diagnosis not present

## 2023-01-04 DIAGNOSIS — D044 Carcinoma in situ of skin of scalp and neck: Secondary | ICD-10-CM | POA: Diagnosis not present

## 2023-01-04 DIAGNOSIS — L814 Other melanin hyperpigmentation: Secondary | ICD-10-CM | POA: Diagnosis not present

## 2023-01-04 DIAGNOSIS — L578 Other skin changes due to chronic exposure to nonionizing radiation: Secondary | ICD-10-CM | POA: Diagnosis not present

## 2023-01-04 DIAGNOSIS — C44329 Squamous cell carcinoma of skin of other parts of face: Secondary | ICD-10-CM | POA: Diagnosis not present

## 2023-01-16 ENCOUNTER — Encounter: Payer: Self-pay | Admitting: Cardiology

## 2023-01-16 ENCOUNTER — Ambulatory Visit: Payer: PPO | Attending: Cardiology | Admitting: Cardiology

## 2023-01-16 VITALS — BP 142/96 | HR 59 | Ht 67.0 in | Wt 134.0 lb

## 2023-01-16 DIAGNOSIS — D6869 Other thrombophilia: Secondary | ICD-10-CM

## 2023-01-16 DIAGNOSIS — I4819 Other persistent atrial fibrillation: Secondary | ICD-10-CM

## 2023-01-16 DIAGNOSIS — I2510 Atherosclerotic heart disease of native coronary artery without angina pectoris: Secondary | ICD-10-CM

## 2023-01-16 NOTE — Patient Instructions (Signed)
Medication Instructions:  Your physician recommends that you continue on your current medications as directed. Please refer to the Current Medication list given to you today.  *If you need a refill on your cardiac medications before your next appointment, please call your pharmacy*   Lab Work: None ordered   Testing/Procedures: None ordered   Follow-Up: At [facility], you and your health needs are our priority.  As part of our continuing mission to provide you with exceptional heart care, we have created designated Provider Care Teams.  These Care Teams include your primary Cardiologist (physician) and Advanced Practice Providers (APPs -  Physician Assistants and Nurse Practitioners) who all work together to provide you with the care you need, when you need it.  Your next appointment:   6 month(s)  The format for your next appointment:   In Person  Provider:   You will see one of the following Advanced Practice Providers on your designated Care Team:   Renee Ursuy, PA-C Michael "Andy" Tillery, PA-C   Thank you for choosing [facility]!!   Lynel Forester, RN [phone]         

## 2023-01-16 NOTE — Progress Notes (Signed)
Electrophysiology Office Note   Date:  01/16/2023   ID:  James, Mayo 03-04-1951, MRN [id number]  PCP:  Sigmund Hazel, MD  Cardiologist:  Eldridge Dace Primary Electrophysiologist:  James Beachem Jorja Loa, MD    Chief Complaint: AF   History of Present Illness: James Mayo is a 72 y.o. male who is being seen today for the evaluation of AF at the request of Sigmund Hazel, MD. Presenting today for electrophysiology evaluation.  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  Atrial fibrillation was diagnosed in 2018.  He was seen 01/06/2022 and was noted to be in atrial fibrillation with symptoms of decreased exercise tolerance.  He was started on Eliquis and had a cardioversion but went back into atrial fibrillation.  He was started on amiodarone.  He had atrial fibrillation ablation 10/18/2022.  Today, denies symptoms of palpitations, chest pain, shortness of breath, orthopnea, PND, lower extremity edema, claudication, dizziness, presyncope, syncope, bleeding, or neurologic sequela. The patient is tolerating medications without difficulties.  He is currently feeling well.  He is having no fatigue or shortness of breath.  Now able to do all of his daily activities and is back to exercising.    Past Medical History:  Diagnosis Date   Aortic valve disorder 03/06/2017    S/p AVR with Edwards pericardial tissue valve and replacement of the ascending aorta with a 32 mm Hemashield Side Arm Graft   BASAL CELL CARCINOMA, FACE 08/19/2009   Qualifier: Diagnosis of By: Darrick Penna MD, KARL     BASAL CELL CARCINOMA, FACE 08/19/2009   Qualifier: Diagnosis of  By: Darrick Penna MD, KARL     Coronary artery disease    Metatarsalgia of left foot 04/29/2014   Neck pain, bilateral 11/08/2013   PAF (paroxysmal atrial fibrillation) (HCC) 03/09/2017   RHINITIS, ALLERGIC 11/09/2006   Qualifier: Diagnosis of  By: Knox Royalty     SHOULDER PAIN, RIGHT 05/06/2009   Qualifier: Diagnosis of  By: Darrick Penna MD, KARL     Thoracic ascending aortic aneurysm (HCC)    Thyroid condition    Past Surgical History:  Procedure Laterality Date   AORTIC VALVE REPLACEMENT  03/06/2017   Procedure: AORTIC VALVE REPLACEMENT (AVR) using Magna Ease valve Size 25mm;  Surgeon: Delight Ovens, MD;  Location: [facility] OR;  Service: Open Heart Surgery;;   ATRIAL FIBRILLATION ABLATION N/A 10/18/2022   Procedure: ATRIAL FIBRILLATION ABLATION;  Surgeon: Regan Lemming, MD;  Location: [facility] INVASIVE CV LAB;  Service: Cardiovascular;  Laterality: N/A;   CARDIAC CATHETERIZATION     CARDIOVERSION N/A 04/14/2017   Procedure: CARDIOVERSION;  Surgeon: Laurey Morale, MD;  Location: [facility] ENDOSCOPY;  Service: Cardiovascular;  Laterality: N/A;   CARDIOVERSION N/A 02/03/2022   Procedure: CARDIOVERSION;  Surgeon: Jodelle Red, MD;  Location: [facility] ENDOSCOPY;  Service: Cardiovascular;  Laterality: N/A;   CARDIOVERSION N/A 03/21/2022   Procedure: CARDIOVERSION;  Surgeon: Sande Rives, MD;  Location: [facility] ENDOSCOPY;  Service: Cardiovascular;  Laterality: N/A;   EYE SURGERY Bilateral    LASIK 10 years   REPLACEMENT ASCENDING AORTA N/A 03/06/2017   Procedure: REPLACEMENT ASCENDING AORTA using 32mm Hemashield Graft - with Hypothermic Circulatory Arrest;  Surgeon: Delight Ovens, MD;  Location[facility] OR;  Service: Open Heart Surgery;  Laterality: N/A;   RIGHT/LEFT HEART CATH AND CORONARY ANGIOGRAPHY N/A 01/25/2017   Procedure: Right/Left Heart Cath and Coronary Angiography;  Surgeon: Lennette Bihari, MD;  [facility]: [facility] INVASIVE CV LAB;  Service: Cardiovascular;  Laterality: N/A;   TEE WITHOUT CARDIOVERSION N/A 03/06/2017   Procedure: TRANSESOPHAGEAL ECHOCARDIOGRAM (TEE);  Surgeon: Delight Ovens, MD;  Location: [facility] OR;  Service: Open Heart Surgery;  Laterality: N/A;   TEE WITHOUT CARDIOVERSION N/A 04/14/2017   Procedure: TRANSESOPHAGEAL ECHOCARDIOGRAM  (TEE);  Surgeon: Laurey Morale, MD;  Location: [facility] ENDOSCOPY;  Service: Cardiovascular;  Laterality: N/A;     Current Outpatient Medications  Medication Sig Dispense Refill   amoxicillin (AMOXIL) 500 MG capsule Take 4 capsules (2,000 mg total) by mouth 1 hour prior to dental procedure 8 capsule PRN   apixaban (ELIQUIS) 5 MG TABS tablet Take 1 tablet (5 mg total) by mouth 2 (two) times daily. 180 tablet 1   Calcium Citrate 250 MG TABS Take 1 tablet by mouth every morning.     Cholecalciferol (VITAMIN D3) 50 MCG (2000 UT) capsule Take 2,000 Units by mouth daily.     levothyroxine (SYNTHROID) 125 MCG tablet Take 1 tablet (125 mcg total) by mouth in the morning on an empty stomach. 90 tablet 3   Multiple Vitamins-Minerals (ICAPS AREDS 2 PO) Take 1 capsule by mouth 2 (two) times daily.     sildenafil (REVATIO) 20 MG tablet Take 20 mg by mouth as needed (for ED).     levothyroxine (SYNTHROID) 125 MCG tablet Take 1 tablet (125 mcg total) by mouth in the morning on an empty stomach (Patient not taking: Reported on 01/16/2023) 90 tablet 0   No current facility-administered medications for this visit.    Allergies:   Patient has no known allergies.   Social History:  The patient  reports that he has never smoked. He has never used smokeless tobacco. He reports current alcohol use. He reports that he does not use drugs.   Family History:  The patient's family history includes Hypertension in his mother; Mental illness in his brother.   ROS:  Please see the history of present illness.   Otherwise, review of systems is positive for none.   All other systems are reviewed and negative.   PHYSICAL EXAM: VS:  BP (!) 142/96   Pulse (!) 59   Ht 5\' 7"  (1.702 m)   Wt 134 lb (60.8 kg)   SpO2 98%   BMI 20.99 kg/m  , BMI Body mass index is 20.99 kg/m. GEN: Well nourished, well developed, in no acute distress  HEENT: normal  Neck: no JVD, carotid bruits, or masses Cardiac: RRR; no murmurs, rubs, or  gallops,no edema  Respiratory:  clear to auscultation bilaterally, normal work of breathing GI: soft, nontender, nondistended, + BS MS: no deformity or atrophy  Skin: warm and dry Neuro:  Strength and sensation are intact Psych: euthymic mood, full affect  EKG:  EKG is ordered today. Personal review of the ekg ordered shows sinus rhythm, rate 59  Recent Labs: 03/07/2022: ALT 25; TSH 5.441 10/18/2022: BUN 24; Creatinine, Ser 0.82; Hemoglobin 14.5; Platelets 128; Potassium 4.2; Sodium 140    Lipid Panel     Component Value Date/Time   CHOL 193 07/24/2019 1144   TRIG 43 07/24/2019 1144   HDL 109 07/24/2019 1144   CHOLHDL 1.8 07/24/2019 1144   CHOLHDL 1.9 09/16/2015 1529   VLDL 9 09/16/2015 1529   LDLCALC 75 07/24/2019 1144     Wt Readings from Last 3 Encounters:  01/16/23 134 lb (60.8 kg)  11/15/22 134 lb 9.6 oz (61.1 kg)  10/18/22 135 lb (61.2 kg)  Other studies Reviewed: Additional studies/ records that were reviewed today include: TTE 01/25/22  Review of the above records today demonstrates:   1. Left ventricular ejection fraction, by estimation, is 70 to 75%. Left  ventricular ejection fraction by PLAX is 70 %. The left ventricle has  hyperdynamic function. The left ventricle has no regional wall motion  abnormalities. There is severe  asymmetric left ventricular hypertrophy of the basal-septal segment. Left  ventricular diastolic function could not be evaluated.   2. Right ventricular systolic function is mildly reduced. The right  ventricular size is normal. There is normal pulmonary artery systolic  pressure. The estimated right ventricular systolic pressure is 26.6 mmHg.   3. Left atrial size was severely dilated.   4. Right atrial size was mildly dilated.   5. The mitral valve is grossly normal. Mild mitral valve regurgitation.   6. The aortic valve has been repaired/replaced. Aortic valve  regurgitation is not visualized. There is a 25 mm Magna pericardial  valve  present in the aortic position. Echo findings are consistent with normal  structure and function of the aortic valve  prosthesis. Aortic valve area, by VTI measures 1.51 cm. Aortic valve mean  gradient measures 13.6 mmHg. Aortic valve Vmax measures 2.59 m/s.   7. Aortic dilatation noted. There is mild dilatation of the aortic root,  measuring 43 mm. There is borderline dilatation of the ascending aorta,  measuring 38 mm.   8. The inferior vena cava is normal in size with greater than 50%  respiratory variability, suggesting right atrial pressure of 3 mmHg.    ASSESSMENT AND PLAN:  1.  Persistent atrial fibrillation: CHA2DS2-VASc of 2.  Currently on Eliquis.  Status post ablation 10/18/2022.  Remains in sinus rhythm.  He would like to eventually get off of his Eliquis.  He understands the recommendation Dorla Guizar be to continue for now.  He Jeanne Diefendorf eventually be interested in [place].  He likes to cycle and is quite nervous about bleeding risk.  He Lecretia Buczek let us know if he would like a referral for watchman.  2.  Secondary hypercoagulable state: Currently on Eliquis for atrial fibrillation  3.  Coronary artery disease: No current angina  4.  Aortic valve/root replacement: Stable on most recent echo.  Continue plan per primary cardiology   Current medicines are reviewed at length with the patient today.   The patient does not have concerns regarding his medicines.  The following changes were made today: None  Labs/ tests ordered today include:  Orders Placed This Encounter  Procedures   EKG 12-Lead     Disposition:   FU 6 months  Signed, Adem Costlow Jorja Loa, MD  01/16/2023 2:55 PM     [facility] [address] [place] [address] [phone] (office) [phone] (fax)

## 2023-01-26 DIAGNOSIS — D485 Neoplasm of uncertain behavior of skin: Secondary | ICD-10-CM | POA: Diagnosis not present

## 2023-01-26 DIAGNOSIS — C44619 Basal cell carcinoma of skin of left upper limb, including shoulder: Secondary | ICD-10-CM | POA: Diagnosis not present

## 2023-01-26 DIAGNOSIS — D0462 Carcinoma in situ of skin of left upper limb, including shoulder: Secondary | ICD-10-CM | POA: Diagnosis not present

## 2023-01-27 DIAGNOSIS — C44329 Squamous cell carcinoma of skin of other parts of face: Secondary | ICD-10-CM | POA: Diagnosis not present

## 2023-02-08 DIAGNOSIS — E039 Hypothyroidism, unspecified: Secondary | ICD-10-CM | POA: Diagnosis not present

## 2023-02-08 DIAGNOSIS — Z7901 Long term (current) use of anticoagulants: Secondary | ICD-10-CM | POA: Diagnosis not present

## 2023-02-08 DIAGNOSIS — I10 Essential (primary) hypertension: Secondary | ICD-10-CM | POA: Diagnosis not present

## 2023-02-08 DIAGNOSIS — R42 Dizziness and giddiness: Secondary | ICD-10-CM | POA: Diagnosis not present

## 2023-02-08 DIAGNOSIS — R040 Epistaxis: Secondary | ICD-10-CM | POA: Diagnosis not present

## 2023-02-09 ENCOUNTER — Other Ambulatory Visit (HOSPITAL_COMMUNITY): Payer: Self-pay

## 2023-02-09 DIAGNOSIS — L578 Other skin changes due to chronic exposure to nonionizing radiation: Secondary | ICD-10-CM | POA: Diagnosis not present

## 2023-02-09 DIAGNOSIS — D044 Carcinoma in situ of skin of scalp and neck: Secondary | ICD-10-CM | POA: Diagnosis not present

## 2023-02-09 DIAGNOSIS — L570 Actinic keratosis: Secondary | ICD-10-CM | POA: Diagnosis not present

## 2023-02-09 MED ORDER — FLUOROURACIL 5 % EX CREA
TOPICAL_CREAM | CUTANEOUS | 1 refills | Status: DC
  Filled 2023-02-09 – 2023-02-13 (×2): qty 40, 30d supply, fill #0

## 2023-02-13 ENCOUNTER — Other Ambulatory Visit (HOSPITAL_COMMUNITY): Payer: Self-pay

## 2023-02-14 ENCOUNTER — Other Ambulatory Visit (HOSPITAL_COMMUNITY): Payer: Self-pay

## 2023-02-14 DIAGNOSIS — Z6821 Body mass index (BMI) 21.0-21.9, adult: Secondary | ICD-10-CM | POA: Diagnosis not present

## 2023-02-14 DIAGNOSIS — R040 Epistaxis: Secondary | ICD-10-CM | POA: Diagnosis not present

## 2023-02-14 DIAGNOSIS — Z7901 Long term (current) use of anticoagulants: Secondary | ICD-10-CM | POA: Diagnosis not present

## 2023-02-15 ENCOUNTER — Encounter: Payer: Self-pay | Admitting: Interventional Cardiology

## 2023-02-16 ENCOUNTER — Other Ambulatory Visit: Payer: Self-pay | Admitting: Surgery

## 2023-02-16 DIAGNOSIS — I7121 Aneurysm of the ascending aorta, without rupture: Secondary | ICD-10-CM

## 2023-02-17 ENCOUNTER — Other Ambulatory Visit: Payer: Self-pay | Admitting: Otolaryngology

## 2023-02-17 DIAGNOSIS — H9313 Tinnitus, bilateral: Secondary | ICD-10-CM | POA: Diagnosis not present

## 2023-02-17 DIAGNOSIS — H903 Sensorineural hearing loss, bilateral: Secondary | ICD-10-CM | POA: Diagnosis not present

## 2023-02-17 DIAGNOSIS — H9190 Unspecified hearing loss, unspecified ear: Secondary | ICD-10-CM

## 2023-02-17 DIAGNOSIS — R040 Epistaxis: Secondary | ICD-10-CM | POA: Diagnosis not present

## 2023-02-17 DIAGNOSIS — R42 Dizziness and giddiness: Secondary | ICD-10-CM | POA: Diagnosis not present

## 2023-02-21 ENCOUNTER — Encounter: Payer: Self-pay | Admitting: Interventional Cardiology

## 2023-02-28 ENCOUNTER — Ambulatory Visit: Payer: PPO | Admitting: Nurse Practitioner

## 2023-03-07 ENCOUNTER — Other Ambulatory Visit (HOSPITAL_COMMUNITY): Payer: Self-pay

## 2023-03-07 ENCOUNTER — Other Ambulatory Visit: Payer: Self-pay

## 2023-03-07 MED ORDER — SILDENAFIL CITRATE 20 MG PO TABS
20.0000 mg | ORAL_TABLET | Freq: Every day | ORAL | 0 refills | Status: DC | PRN
  Filled 2023-03-07: qty 30, 6d supply, fill #0

## 2023-03-08 ENCOUNTER — Other Ambulatory Visit (HOSPITAL_COMMUNITY): Payer: Self-pay

## 2023-03-09 DIAGNOSIS — R42 Dizziness and giddiness: Secondary | ICD-10-CM | POA: Diagnosis not present

## 2023-03-14 DIAGNOSIS — R040 Epistaxis: Secondary | ICD-10-CM | POA: Diagnosis not present

## 2023-03-14 DIAGNOSIS — R42 Dizziness and giddiness: Secondary | ICD-10-CM | POA: Diagnosis not present

## 2023-03-14 DIAGNOSIS — H903 Sensorineural hearing loss, bilateral: Secondary | ICD-10-CM | POA: Diagnosis not present

## 2023-03-17 DIAGNOSIS — L570 Actinic keratosis: Secondary | ICD-10-CM | POA: Diagnosis not present

## 2023-03-22 ENCOUNTER — Ambulatory Visit: Payer: PPO | Admitting: Surgery

## 2023-03-29 ENCOUNTER — Ambulatory Visit
Admission: RE | Admit: 2023-03-29 | Discharge: 2023-03-29 | Disposition: A | Discharge status: Home / Self Care | Payer: PPO | Source: Ambulatory Visit | Attending: Otolaryngology | Admitting: Otolaryngology

## 2023-03-29 DIAGNOSIS — H9192 Unspecified hearing loss, left ear: Secondary | ICD-10-CM | POA: Diagnosis not present

## 2023-03-29 DIAGNOSIS — R42 Dizziness and giddiness: Secondary | ICD-10-CM | POA: Diagnosis not present

## 2023-03-29 DIAGNOSIS — H9190 Unspecified hearing loss, unspecified ear: Secondary | ICD-10-CM

## 2023-03-29 MED ORDER — GADOPICLENOL 0.5 MMOL/ML IV SOLN
7.5000 mL | Freq: Once | INTRAVENOUS | Status: AC | PRN
  Administered 2023-03-29: 7.5 mL via INTRAVENOUS

## 2023-03-30 DIAGNOSIS — D0462 Carcinoma in situ of skin of left upper limb, including shoulder: Secondary | ICD-10-CM | POA: Diagnosis not present

## 2023-03-30 DIAGNOSIS — C44619 Basal cell carcinoma of skin of left upper limb, including shoulder: Secondary | ICD-10-CM | POA: Diagnosis not present

## 2023-03-31 ENCOUNTER — Other Ambulatory Visit: Payer: PPO

## 2023-04-04 ENCOUNTER — Ambulatory Visit: Payer: PPO | Admitting: Sports Medicine

## 2023-04-05 ENCOUNTER — Ambulatory Visit: Payer: PPO | Admitting: Surgery

## 2023-04-05 ENCOUNTER — Encounter: Payer: Self-pay | Admitting: Surgery

## 2023-04-05 ENCOUNTER — Ambulatory Visit
Admission: RE | Admit: 2023-04-05 | Discharge: 2023-04-05 | Disposition: A | Discharge status: Home / Self Care | Payer: PPO | Source: Ambulatory Visit | Attending: Surgery | Admitting: Surgery

## 2023-04-05 VITALS — BP 153/91 | HR 63 | Resp 18 | Ht 67.0 in | Wt 135.0 lb

## 2023-04-05 DIAGNOSIS — I7121 Aneurysm of the ascending aorta, without rupture: Secondary | ICD-10-CM

## 2023-04-05 DIAGNOSIS — Z09 Encounter for follow-up examination after completed treatment for conditions other than malignant neoplasm: Secondary | ICD-10-CM | POA: Diagnosis not present

## 2023-04-05 MED ORDER — IOPAMIDOL (ISOVUE-370) INJECTION 76%
75.0000 mL | Freq: Once | INTRAVENOUS | Status: AC | PRN
  Administered 2023-04-05: 75 mL via INTRAVENOUS

## 2023-04-06 ENCOUNTER — Ambulatory Visit: Payer: PPO | Attending: Otolaryngology | Admitting: Physical Therapy

## 2023-04-06 ENCOUNTER — Other Ambulatory Visit: Payer: Self-pay

## 2023-04-06 ENCOUNTER — Encounter: Payer: Self-pay | Admitting: Physical Therapy

## 2023-04-06 VITALS — BP 140/90 | HR 55

## 2023-04-06 DIAGNOSIS — M542 Cervicalgia: Secondary | ICD-10-CM | POA: Insufficient documentation

## 2023-04-06 DIAGNOSIS — R293 Abnormal posture: Secondary | ICD-10-CM | POA: Insufficient documentation

## 2023-04-06 DIAGNOSIS — R2681 Unsteadiness on feet: Secondary | ICD-10-CM | POA: Insufficient documentation

## 2023-04-06 DIAGNOSIS — M6281 Muscle weakness (generalized): Secondary | ICD-10-CM | POA: Insufficient documentation
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
## 2023-04-10 ENCOUNTER — Encounter: Payer: Self-pay | Admitting: Surgery

## 2023-04-10 NOTE — Progress Notes (Signed)
HPI:  The patient is a 72 year old gentleman who underwent aortic valve replacement with a 25 mm Edwards model [id number] pericardial valve and supracoronary replacement of the ascending aorta under hypothermic circulatory arrest by Dr. Tyrone Sage on 03/08/2017.  He was last seen by Dr. Vickey Sages on 03/18/2021 for scheduled follow-up.  The patient has reportedly had elevated aortic valve gradients but has remained asymptomatic.  His most recent echocardiogram on 01/25/2022 showed an aortic valve mean gradient of 13.6 mmHg with normal left ventricular systolic function and severe asymmetric LVH of the basal-septal segment.  He continues to feel well without chest pain or shortness of breath.  Current Outpatient Medications  Medication Sig Dispense Refill   amoxicillin (AMOXIL) 500 MG capsule Take 4 capsules (2,000 mg total) by mouth 1 hour prior to dental procedure 8 capsule PRN   apixaban (ELIQUIS) 5 MG TABS tablet Take 1 tablet (5 mg total) by mouth 2 (two) times daily. 180 tablet 1   Calcium Citrate 250 MG TABS Take 1 tablet by mouth every morning.     Cholecalciferol (VITAMIN D3) 50 MCG (2000 UT) capsule Take 2,000 Units by mouth daily.     fluorouracil (EFUDEX) 5 % cream Apply 2 times per day to actinic keratoses on the scalp for 3 weeks. Wash off in morning.  Start in one month from 02/09/23 40 g 1   levothyroxine (SYNTHROID) 125 MCG tablet Take 1 tablet (125 mcg total) by mouth in the morning on an empty stomach 90 tablet 0   levothyroxine (SYNTHROID) 125 MCG tablet Take 1 tablet (125 mcg total) by mouth in the morning on an empty stomach. 90 tablet 3   Multiple Vitamins-Minerals (ICAPS AREDS 2 PO) Take 1 capsule by mouth 2 (two) times daily.     sildenafil (REVATIO) 20 MG tablet Take 20 mg by mouth as needed (for ED).     sildenafil (REVATIO) 20 MG tablet Take 1-5 tablets (20-100 mg total) by mouth daily as needed. 30 tablet 0   No current facility-administered medications for this visit.      Physical Exam: BP (!) 153/91 (BP Location: Left Arm, Patient Position: Sitting)   Pulse 63   Resp 18   Ht 5\' 7"  (1.702 m)   Wt 135 lb (61.2 kg)   SpO2 97% Comment: RA  BMI 21.14 kg/m  He looks well. Cardiac exam shows a regular rate and rhythm with a 2/6 systolic murmur along the right sternal border.  There is no diastolic murmur. Lungs are clear.  Diagnostic Tests:  Narrative & Impression  CLINICAL DATA:  Follow up thoracic aortic aneurysm. Previous aortic valve replacement.   EXAM: CT ANGIOGRAPHY CHEST WITH CONTRAST   TECHNIQUE: Multidetector CT imaging of the chest was performed using the standard protocol during bolus administration of intravenous contrast. Multiplanar CT image reconstructions and MIPs were obtained to evaluate the vascular anatomy.   RADIATION DOSE REDUCTION: This exam was performed according to the departmental dose-optimization program which includes automated exposure control, adjustment of the mA and/or kV according to patient size and/or use of iterative reconstruction technique.   CONTRAST:  75mL ISOVUE-370 IOPAMIDOL (ISOVUE-370) INJECTION 76%   COMPARISON:  Chest CTA 03/18/2021 and 03/05/2020   FINDINGS: Cardiovascular: Stable postsurgical changes from previous aortic valve replacement and grafting of the ascending aorta. There is some pulsation artifact on the images through the ascending aorta. Thoracic aortic root diameter at the sinuses of Valsalva is 4.8 cm, unchanged. The aortic arch and descending aorta are normal  in caliber. There is stable tortuosity of the distal descending thoracic aorta. No evidence of dissection. Mild atherosclerosis of the aorta, great vessels and coronary arteries. The heart size is normal. There is no pericardial effusion.   Mediastinum/Nodes: There are no enlarged mediastinal, hilar or axillary lymph nodes. The thyroid gland, trachea and esophagus demonstrate no significant findings.    Lungs/Pleura: No pleural effusion or pneumothorax. Interval development of multifocal patchy ground-glass and part solid opacities within the right lower lobe and lingula, not present on the cardiac CT of 10/11/2022. The largest part solid component is in the right lower lobe, measuring 1.4 x 1.2 cm on image 120/11. These are most likely infectious/inflammatory in etiology. No consolidation.   Upper abdomen: Stable extrahepatic biliary dilatation and mild dilatation of the pancreatic duct. No acute findings identified in the visualized upper abdomen.   Musculoskeletal/Chest wall: There is no chest wall mass or suspicious osseous finding. Previous median sternotomy. Multiple old right-sided rib fractures.   Unless specific follow-up recommendations are mentioned in the findings or impression sections, no imaging follow-up of any mentioned incidental findings is recommended.   Review of the MIP images confirms the above findings.   IMPRESSION: 1. Stable appearance of the thoracic aorta status post aortic valve replacement and grafting of the ascending aorta. 2. Interval development of multifocal patchy ground-glass and part solid opacities within the right lower lobe and lingula, most likely infectious/inflammatory in etiology. Follow-up noncontrast chest CT in 3-6 months recommended to confirm resolution. 3.  Aortic Atherosclerosis (ICD10-I70.0).     Electronically Signed   By: Carey Bullocks M.D.   On: 04/05/2023 11:55      Impression:  He has stable dilation of the aortic root at the sinus level of 4.8 cm.  This is still well below the surgical threshold.  The ascending aortic repair appears stable.  The aortic arch and descending thoracic aorta are within normal limits with a tortuous descending aorta.  There has been interval development of multifocal patchy groundglass and part solid opacities in the right lower lobe and lingula that are most likely  infectious/inflammatory but will require continued follow-up.  I reviewed the CT images with him and answered all of his questions.  I stressed the importance of continued good blood pressure control in preventing further enlargement and acute aortic dissection.  I advised him against doing any heavy lifting or strenuous physical activity that may require a Valsalva maneuver and could suddenly raise his blood pressure to high levels.  Plan:  I will see him back in about 6 months with a CT scan of the chest without contrast to follow-up on these nodular opacities.  I spent 15 minutes performing this established patient evaluation and > 50% of this time was spent face to face counseling and coordinating the care of this patient's aortic root aneurysm.    Alleen Borne, MD [facility] [phone]

## 2023-04-25 ENCOUNTER — Encounter: Payer: Self-pay | Admitting: Physical Therapy

## 2023-04-25 ENCOUNTER — Ambulatory Visit: Payer: PPO | Attending: Otolaryngology | Admitting: Physical Therapy

## 2023-04-25 VITALS — BP 142/86 | HR 57

## 2023-04-25 DIAGNOSIS — R293 Abnormal posture: Secondary | ICD-10-CM | POA: Diagnosis not present

## 2023-04-25 DIAGNOSIS — M542 Cervicalgia: Secondary | ICD-10-CM | POA: Insufficient documentation

## 2023-04-25 DIAGNOSIS — M6281 Muscle weakness (generalized): Secondary | ICD-10-CM | POA: Insufficient documentation

## 2023-04-25 DIAGNOSIS — R2681 Unsteadiness on feet: Secondary | ICD-10-CM | POA: Diagnosis not present
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
## 2023-05-09 DIAGNOSIS — Z6820 Body mass index (BMI) 20.0-20.9, adult: Secondary | ICD-10-CM | POA: Diagnosis not present

## 2023-05-09 DIAGNOSIS — E039 Hypothyroidism, unspecified: Secondary | ICD-10-CM | POA: Diagnosis not present

## 2023-05-09 DIAGNOSIS — J069 Acute upper respiratory infection, unspecified: Secondary | ICD-10-CM | POA: Diagnosis not present

## 2023-05-18 DIAGNOSIS — R0689 Other abnormalities of breathing: Secondary | ICD-10-CM | POA: Diagnosis not present

## 2023-05-18 DIAGNOSIS — J189 Pneumonia, unspecified organism: Secondary | ICD-10-CM | POA: Diagnosis not present

## 2023-05-18 DIAGNOSIS — Z6820 Body mass index (BMI) 20.0-20.9, adult: Secondary | ICD-10-CM | POA: Diagnosis not present

## 2023-05-18 DIAGNOSIS — R509 Fever, unspecified: Secondary | ICD-10-CM | POA: Diagnosis not present

## 2023-05-19 ENCOUNTER — Other Ambulatory Visit (HOSPITAL_COMMUNITY): Payer: Self-pay

## 2023-05-19 MED ORDER — AZITHROMYCIN 250 MG PO TABS
ORAL_TABLET | ORAL | 0 refills | Status: AC
  Filled 2023-05-19: qty 6, 5d supply, fill #0

## 2023-05-19 MED ORDER — AMOXICILLIN-POT CLAVULANATE 875-125 MG PO TABS
1.0000 | ORAL_TABLET | Freq: Two times a day (BID) | ORAL | 0 refills | Status: AC
  Filled 2023-05-19: qty 20, 10d supply, fill #0

## 2023-05-22 ENCOUNTER — Other Ambulatory Visit (HOSPITAL_COMMUNITY): Payer: Self-pay

## 2023-05-24 ENCOUNTER — Other Ambulatory Visit (HOSPITAL_COMMUNITY): Payer: Self-pay

## 2023-05-24 MED ORDER — LEVOTHYROXINE SODIUM 125 MCG PO TABS
125.0000 ug | ORAL_TABLET | Freq: Every day | ORAL | 3 refills | Status: AC
  Filled 2023-05-24: qty 90, 90d supply, fill #0
  Filled 2023-08-23: qty 90, 90d supply, fill #1
  Filled 2023-11-21: qty 90, 90d supply, fill #2

## 2023-05-29 ENCOUNTER — Other Ambulatory Visit (HOSPITAL_COMMUNITY): Payer: Self-pay | Admitting: Pain Medicine

## 2023-05-29 ENCOUNTER — Ambulatory Visit (HOSPITAL_COMMUNITY)
Admission: RE | Admit: 2023-05-29 | Discharge: 2023-05-29 | Disposition: A | Discharge status: Home / Self Care | Payer: PPO | Source: Ambulatory Visit | Attending: Pain Medicine | Admitting: Pain Medicine

## 2023-05-29 DIAGNOSIS — R350 Frequency of micturition: Secondary | ICD-10-CM | POA: Diagnosis not present

## 2023-05-29 DIAGNOSIS — R918 Other nonspecific abnormal finding of lung field: Secondary | ICD-10-CM | POA: Diagnosis not present

## 2023-05-29 DIAGNOSIS — Z6820 Body mass index (BMI) 20.0-20.9, adult: Secondary | ICD-10-CM | POA: Diagnosis not present

## 2023-05-29 DIAGNOSIS — S2241XA Multiple fractures of ribs, right side, initial encounter for closed fracture: Secondary | ICD-10-CM | POA: Diagnosis not present

## 2023-05-29 DIAGNOSIS — R0602 Shortness of breath: Secondary | ICD-10-CM | POA: Insufficient documentation

## 2023-05-29 DIAGNOSIS — J189 Pneumonia, unspecified organism: Secondary | ICD-10-CM | POA: Diagnosis not present

## 2023-05-29 DIAGNOSIS — I700 Atherosclerosis of aorta: Secondary | ICD-10-CM | POA: Diagnosis not present

## 2023-05-29 MED ORDER — IOHEXOL 350 MG/ML SOLN
75.0000 mL | Freq: Once | INTRAVENOUS | Status: AC | PRN
  Administered 2023-05-29: 75 mL via INTRAVENOUS

## 2023-05-30 ENCOUNTER — Other Ambulatory Visit (HOSPITAL_COMMUNITY): Payer: Self-pay

## 2023-05-30 MED ORDER — ALBUTEROL SULFATE HFA 108 (90 BASE) MCG/ACT IN AERS
1.0000 | INHALATION_SPRAY | RESPIRATORY_TRACT | 0 refills | Status: DC | PRN
  Filled 2023-05-30: qty 6.7, 25d supply, fill #0

## 2023-05-30 MED ORDER — LEVOFLOXACIN 750 MG PO TABS
750.0000 mg | ORAL_TABLET | Freq: Every day | ORAL | 0 refills | Status: AC
  Filled 2023-05-30: qty 10, 10d supply, fill #0

## 2023-06-13 DIAGNOSIS — I700 Atherosclerosis of aorta: Secondary | ICD-10-CM | POA: Diagnosis not present

## 2023-06-13 DIAGNOSIS — Z125 Encounter for screening for malignant neoplasm of prostate: Secondary | ICD-10-CM | POA: Diagnosis not present

## 2023-06-13 DIAGNOSIS — E559 Vitamin D deficiency, unspecified: Secondary | ICD-10-CM | POA: Diagnosis not present

## 2023-06-13 DIAGNOSIS — M8580 Other specified disorders of bone density and structure, unspecified site: Secondary | ICD-10-CM | POA: Diagnosis not present

## 2023-06-13 DIAGNOSIS — J189 Pneumonia, unspecified organism: Secondary | ICD-10-CM | POA: Diagnosis not present

## 2023-06-13 DIAGNOSIS — E039 Hypothyroidism, unspecified: Secondary | ICD-10-CM | POA: Diagnosis not present

## 2023-06-19 ENCOUNTER — Other Ambulatory Visit (HOSPITAL_COMMUNITY): Payer: Self-pay

## 2023-06-19 ENCOUNTER — Other Ambulatory Visit (HOSPITAL_BASED_OUTPATIENT_CLINIC_OR_DEPARTMENT_OTHER): Payer: Self-pay

## 2023-06-20 ENCOUNTER — Encounter: Payer: Self-pay | Admitting: Family Medicine

## 2023-06-20 ENCOUNTER — Ambulatory Visit: Payer: PPO | Admitting: Family Medicine

## 2023-06-20 ENCOUNTER — Other Ambulatory Visit: Payer: Self-pay

## 2023-06-20 VITALS — BP 127/81 | Ht 67.0 in | Wt 134.0 lb

## 2023-06-20 DIAGNOSIS — M79662 Pain in left lower leg: Secondary | ICD-10-CM

## 2023-06-20 NOTE — Progress Notes (Signed)
SUBJECTIVE:   CHIEF COMPLAINT / HPI: left calf pain  James Mayo is a pleasant 72 year old presenting with a several day history of achy mid calf pain that began towards the end of his course of Levaquin for community-acquired pneumonia.  Initially treated with Azithromycin + Augmentin, but did not improve, then required course of Levaquin.  He initially had pain in his right Achilles that is now resolved.  And now has pain in his mid calf on the left side.  He has had to reduce the amount he bikes to 0 and increase the amount he swims as a result.  He normally bikes 4 times a week and swims 3 times a week.  Has taken 200 mg ibuprofen daily which has helped.  Denies redness or swelling.  PERTINENT  PMH / PSH: PAF  OBJECTIVE:   BP 127/81   Ht 5\' 7"  (1.702 m)   Wt 134 lb (60.8 kg)   BMI 20.99 kg/m   General: NAD, well appearing Neuro: A&O Respiratory: normal WOB on RA Extremities: Moving all 4 extremities equally  Left ankle/calf Inspection: Superficial lateral bruising present, no obvious deformity, erythema, or swelling Palpation: NTTP at Achilles insertion, distal Achilles, proximal Achilles, myofascial Achilles junction ROM: Full range of motion of ankle in dorsiflexion, plantarflexion, eversion, inversion  Special Tests: Negative Homans' sign Strength: 5/5 plantarflexion, dorsiflexion, eversion, inversion Additional: Pain initially reproducible with extreme limited dorsiflexion, mild reproducible pain with resisted plantarflexion  Right ankle/calf Inspection: no bruising, no obvious deformity, erythema, or swelling Palpation: NTTP at Achilles insertion, distal Achilles, proximal Achilles, myofascial Achilles junction ROM: Full range of motion of ankle in dorsiflexion, plantarflexion, eversion, inversion  Special Tests: Negative Homans' sign Strength: 5/5 plantarflexion, dorsiflexion, eversion, inversion without pain  Gait: normal gait  Limited left ankle ultrasound: No  signs of hypoechoic changes along the length of Achilles tendon from insertion to myofascial junction.  No signs of tearing.  Significant calcific tendinosis at insertion of Achilles present.  ASSESSMENT/PLAN:   Assessment & Plan Pain of left calf Likely secondary to proximal Achilles tendinitis.  Difficult to determine whether Levaquin side effect played a role in patient's presentation, as he would be just as likely to present with Achilles pain as his activity level decreased and then ramped back up with his prior pneumonia.  It is reassuring that his ultrasound does not show signs of tendinopathy or tear and he is no longer on Levaquin.  Plan discussed below with patient who is agreeable. -Calf compression sleeve on left -Ice 15 minutes 3-4 times a day as needed -Voltaren gel 3-4 times every 6 hours as needed for pain -You may return to full activity for something -Gradually increase your biking over the next 3 weeks back to baseline -Home exercise regimen for Achilles tendon strengthening -Follow-up 4 to 6 weeks as needed if not improving  Return if symptoms worsen or fail to improve.  Celine Mans, MD, PGY-2 [facility] 5:07 PM 06/20/2023    DATE OF VISIT: 06/20/2023       James Mayo DOB: 06/26/1951 MRN: [id number]  ATTENDING ADDENDUM:  Patient seen and examined with resident physician, Celine Mans, MD.  I agree with findings and plan as noted in resident note.  Agree with exam as documented in Resident note  A/P: 1. Left calf pain - calf strain vs possible fluoroquinolone induced tendon pain due to recent usage for pneumonia - overall improving, but still symptomatic - agree with plan as noted in resident  note - I was present for ultrasound exam and agree with findings as noted above  Please refer to resident's note for additional details of the visit.   Encounter Diagnosis  Name Primary?   Pain of left calf Yes    Orders Placed This Encounter   Procedures   [place] LIMITED JOINT SPACE STRUCTURES LOW LEFT

## 2023-06-20 NOTE — Patient Instructions (Signed)
You have calf pain that is likely related to a calf strain and a possible side effect of your recent antibiotic (Levaquin)  - You can use the compression sleeve to help with pain if tolerated.  You only need to wear with activity.  You do not need to wear to sleep. - You can apply heat or ice as needed  for 15 minutes at a time 3-4 times a day - You can apply over-the-counter Voltaren gel to the area every 6-8 hours as needed - it is ok to swim as long as it is not worsening your symptoms - you can start biking in 1-2 weeks as long as it is not worsening your symptoms - you should perform the exercises we provided you - you should follow up with me in 4-6 weeks if worsening or not improving - Please don't hesitate to reach out with any questions or concerns

## 2023-06-22 DIAGNOSIS — Z23 Encounter for immunization: Secondary | ICD-10-CM | POA: Diagnosis not present

## 2023-06-22 DIAGNOSIS — T887XXA Unspecified adverse effect of drug or medicament, initial encounter: Secondary | ICD-10-CM | POA: Diagnosis not present

## 2023-06-22 DIAGNOSIS — J189 Pneumonia, unspecified organism: Secondary | ICD-10-CM | POA: Diagnosis not present

## 2023-06-28 ENCOUNTER — Telehealth: Payer: Self-pay | Admitting: Interventional Cardiology

## 2023-06-28 ENCOUNTER — Other Ambulatory Visit (HOSPITAL_COMMUNITY): Payer: Self-pay

## 2023-06-28 MED ORDER — AMOXICILLIN 500 MG PO CAPS
2000.0000 mg | ORAL_CAPSULE | ORAL | 99 refills | Status: DC
  Filled 2023-06-28: qty 8, 2d supply, fill #0

## 2023-06-28 NOTE — Telephone Encounter (Signed)
*  STAT* If patient is at the pharmacy, call can be transferred to refill team.   1. Which medications need to be refilled? (please list name of each medication and dose if known)   amoxicillin (AMOXIL) 500 MG capsule    2. Which pharmacy/location (including street and city if local pharmacy) is medication to be sent to?  [place] [facility]    3. Do they need a 30 day or 90 day supply? 90 .

## 2023-06-29 ENCOUNTER — Other Ambulatory Visit (HOSPITAL_COMMUNITY): Payer: Self-pay

## 2023-06-29 MED ORDER — AMOXICILLIN 500 MG PO CAPS
2000.0000 mg | ORAL_CAPSULE | ORAL | 0 refills | Status: AC
  Filled 2023-06-29: qty 4, 1d supply, fill #0

## 2023-06-29 NOTE — Telephone Encounter (Signed)
Pt's medication was sent to pt's pharmacy as requested. Confirmation received.  °

## 2023-07-03 DIAGNOSIS — R918 Other nonspecific abnormal finding of lung field: Secondary | ICD-10-CM | POA: Diagnosis not present

## 2023-07-03 DIAGNOSIS — J189 Pneumonia, unspecified organism: Secondary | ICD-10-CM | POA: Diagnosis not present

## 2023-07-12 ENCOUNTER — Other Ambulatory Visit (HOSPITAL_COMMUNITY): Payer: Self-pay

## 2023-07-12 MED ORDER — SILDENAFIL CITRATE 20 MG PO TABS
20.0000 mg | ORAL_TABLET | Freq: Every day | ORAL | 0 refills | Status: DC | PRN
  Filled 2023-07-12: qty 30, 6d supply, fill #0

## 2023-07-25 DIAGNOSIS — H353221 Exudative age-related macular degeneration, left eye, with active choroidal neovascularization: Secondary | ICD-10-CM | POA: Diagnosis not present

## 2023-07-25 DIAGNOSIS — H353112 Nonexudative age-related macular degeneration, right eye, intermediate dry stage: Secondary | ICD-10-CM | POA: Diagnosis not present

## 2023-07-25 DIAGNOSIS — H52203 Unspecified astigmatism, bilateral: Secondary | ICD-10-CM | POA: Diagnosis not present

## 2023-07-25 DIAGNOSIS — H2513 Age-related nuclear cataract, bilateral: Secondary | ICD-10-CM | POA: Diagnosis not present

## 2023-07-28 DIAGNOSIS — H353112 Nonexudative age-related macular degeneration, right eye, intermediate dry stage: Secondary | ICD-10-CM | POA: Diagnosis not present

## 2023-07-28 DIAGNOSIS — H353221 Exudative age-related macular degeneration, left eye, with active choroidal neovascularization: Secondary | ICD-10-CM | POA: Diagnosis not present

## 2023-07-28 DIAGNOSIS — H43821 Vitreomacular adhesion, right eye: Secondary | ICD-10-CM | POA: Diagnosis not present

## 2023-07-28 DIAGNOSIS — H43812 Vitreous degeneration, left eye: Secondary | ICD-10-CM | POA: Diagnosis not present

## 2023-07-28 DIAGNOSIS — H2513 Age-related nuclear cataract, bilateral: Secondary | ICD-10-CM | POA: Diagnosis not present

## 2023-08-08 DIAGNOSIS — Z471 Aftercare following joint replacement surgery: Secondary | ICD-10-CM | POA: Diagnosis not present

## 2023-08-08 DIAGNOSIS — R0602 Shortness of breath: Secondary | ICD-10-CM | POA: Diagnosis not present

## 2023-08-08 DIAGNOSIS — J189 Pneumonia, unspecified organism: Secondary | ICD-10-CM | POA: Diagnosis not present

## 2023-08-22 ENCOUNTER — Other Ambulatory Visit: Payer: Self-pay | Admitting: Surgery

## 2023-08-22 DIAGNOSIS — I7121 Aneurysm of the ascending aorta, without rupture: Secondary | ICD-10-CM

## 2023-08-25 DIAGNOSIS — H353221 Exudative age-related macular degeneration, left eye, with active choroidal neovascularization: Secondary | ICD-10-CM | POA: Diagnosis not present

## 2023-09-27 ENCOUNTER — Other Ambulatory Visit: Payer: PPO

## 2023-09-28 ENCOUNTER — Ambulatory Visit
Admission: RE | Admit: 2023-09-28 | Discharge: 2023-09-28 | Disposition: A | Discharge status: Home / Self Care | Payer: PPO | Source: Ambulatory Visit | Attending: Surgery | Admitting: Surgery

## 2023-09-28 DIAGNOSIS — I7121 Aneurysm of the ascending aorta, without rupture: Secondary | ICD-10-CM

## 2023-10-04 ENCOUNTER — Ambulatory Visit: Payer: PPO | Admitting: Surgery

## 2023-10-04 ENCOUNTER — Encounter: Payer: Self-pay | Admitting: Surgery

## 2023-10-04 VITALS — BP 162/80 | HR 53 | Resp 20 | Ht 67.0 in | Wt 134.0 lb

## 2023-10-04 DIAGNOSIS — R918 Other nonspecific abnormal finding of lung field: Secondary | ICD-10-CM

## 2023-10-04 NOTE — Progress Notes (Signed)
HPI:  The patient is a 73 year old gentleman who underwent aortic valve replacement with a 25 mm Edwards model 3300TFX pericardial valve and supracoronary replacement of the ascending aorta under hypothermic circulatory arrest by Dr. Tyrone Sage on 03/08/2017.  I last saw him on 04/05/2023 and CT of the chest showed new multifocal patchy groundglass and part solid opacities in both lungs that were felt to be likely infectious/inflammatory.  He said that he had pneumonia last summer.  He continues to feel well overall.  He denies any chest pain or shortness of breath.  Current Outpatient Medications  Medication Sig Dispense Refill   amoxicillin (AMOXIL) 500 MG capsule Take 4 capsules (2,000 mg total) by mouth 1 hour prior to dental procedure 4 capsule 0   apixaban (ELIQUIS) 5 MG TABS tablet Take 1 tablet (5 mg total) by mouth 2 (two) times daily. 180 tablet 1   Calcium Citrate 250 MG TABS Take 1 tablet by mouth every morning.     Cholecalciferol (VITAMIN D3) 50 MCG (2000 UT) capsule Take 2,000 Units by mouth daily.     levothyroxine (SYNTHROID) 125 MCG tablet Take 1 tablet (125 mcg total) by mouth daily. 90 tablet 3   Multiple Vitamins-Minerals (ICAPS AREDS 2 PO) Take 1 capsule by mouth 2 (two) times daily.     sildenafil (REVATIO) 20 MG tablet Take 20 mg by mouth as needed (for ED).     sildenafil (REVATIO) 20 MG tablet Take 1-5 tablets (20-100 mg total) by mouth daily as needed. 30 tablet 0   albuterol (VENTOLIN HFA) 108 (90 Base) MCG/ACT inhaler Inhale 1 puff into the lungs every 4 (four) hours as needed. 6.7 g 0   levothyroxine (SYNTHROID) 125 MCG tablet Take 1 tablet (125 mcg total) by mouth in the morning on an empty stomach 90 tablet 0   sildenafil (REVATIO) 20 MG tablet Take 1-5 tablets (20-100 mg total) by mouth daily as needed. 30 tablet 0   No current facility-administered medications for this visit.     Physical Exam: BP (!) 162/80   Pulse (!) 53   Resp 20   Ht 5\' 7"  (1.702 m)    Wt 134 lb (60.8 kg)   SpO2 98% Comment: RA  BMI 20.99 kg/m  He looks well. Cardiac exam shows a regular rate and rhythm with normal heart sounds.  There is no murmur. Lungs are clear.  Diagnostic Tests:  Narrative & Impression  CLINICAL DATA:  Follow up pulmonary nodule. 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   EXAM: CT CHEST WITHOUT CONTRAST   TECHNIQUE: Multidetector CT imaging of the chest was performed following the standard protocol without IV contrast.   RADIATION DOSE REDUCTION: This exam was performed according to the departmental dose-optimization program which includes automated exposure control, adjustment of the mA and/or kV according to patient size and/or use of iterative reconstruction technique.   COMPARISON:  Chest CT 05/29/2023. Chest CTA 04/05/2023 and 03/18/2021.   FINDINGS: Cardiovascular: Stable postsurgical changes from previous median sternotomy, aortic valve replacement and ascending aortic grafting. The aortic root at the sinuses of Valsalva but is not well visualized due to pulsation artifact and lack of contrast, although is grossly unchanged at approximately 4.8 cm. No displaced intimal calcifications are identified. The heart size is normal. There is no pericardial effusion.   Mediastinum/Nodes: There are no enlarged mediastinal, hilar or axillary lymph nodes.Hilar assessment is limited by the lack of intravenous contrast, although the  hilar contours appear unchanged. The thyroid gland, trachea and esophagus demonstrate no significant findings.   Lungs/Pleura: No pleural effusion or pneumothorax. Interval significant improvement in the previously demonstrated areas of consolidation anteriorly in the right upper lobe and within the right lower lobe and lingula. There is residual clustered nodularity dependently in the right lower lobe. There are areas of residual and new  ground-glass opacity throughout both lungs. No suspicious pulmonary nodule or confluent airspace disease.   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 The visualized pancreas appears unchanged. Renal cortical scarring and small cysts are noted for which no specific follow-up imaging is recommended.   Musculoskeletal/Chest wall: There is no chest wall mass or suspicious osseous finding. Healed median sternotomy. Old rib fractures bilaterally.   IMPRESSION: 1. Interval significant improvement in previously demonstrated areas of bilateral pulmonary consolidation, as described. There is residual clustered nodularity dependently in the right lower lobe, likely infectious/inflammatory. 2. There are areas of residual and new ground-glass opacity throughout both lungs, likely infectious/inflammatory. No suspicious pulmonary nodules. 3. Stable postsurgical changes from previous median sternotomy, aortic valve replacement and ascending aortic grafting. The aortic root is grossly unchanged at approximately 4.8 cm. 4. 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     Electronically Signed   By: Carey Bullocks M.D.   On: 09/28/2023 09:53      Impression:  There has been no change in the aortic root dilation at 4.8 cm.  There has been significant improvement in the previously seen areas of bilateral pulmonary consolidation and these were likely infectious/inflammatory related to his pneumonia last summer.  There are some small areas of residual new groundglass opacity throughout both lungs that are most likely infectious/inflammatory but no suspicious pulmonary nodules.  He feels well clinically.  I reviewed the CT images with him and answered all of his questions.  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  I spent 10 minutes performing this established patient evaluation and > 50% of this time was spent face to face counseling and coordinating the care of this patient's bilateral pulmonary densities and aortic root aneurysm.    Alleen Borne, MD [facility] [phone]

## 2023-10-20 DIAGNOSIS — H353112 Nonexudative age-related macular degeneration, right eye, intermediate dry stage: Secondary | ICD-10-CM | POA: Diagnosis not present

## 2023-11-14 ENCOUNTER — Other Ambulatory Visit (HOSPITAL_COMMUNITY): Payer: Self-pay

## 2023-11-14 ENCOUNTER — Encounter: Payer: Self-pay | Admitting: Student

## 2023-11-14 ENCOUNTER — Ambulatory Visit: Attending: Student | Admitting: Student

## 2023-11-14 DIAGNOSIS — I4819 Other persistent atrial fibrillation: Secondary | ICD-10-CM

## 2023-11-14 DIAGNOSIS — I2510 Atherosclerotic heart disease of native coronary artery without angina pectoris: Secondary | ICD-10-CM

## 2023-11-14 DIAGNOSIS — D6869 Other thrombophilia: Secondary | ICD-10-CM | POA: Diagnosis not present

## 2023-11-14 DIAGNOSIS — Z952 Presence of prosthetic heart valve: Secondary | ICD-10-CM | POA: Diagnosis not present

## 2023-11-14 DIAGNOSIS — I480 Paroxysmal atrial fibrillation: Secondary | ICD-10-CM

## 2023-11-14 MED ORDER — APIXABAN 5 MG PO TABS
5.0000 mg | ORAL_TABLET | Freq: Two times a day (BID) | ORAL | 3 refills | Status: DC
  Filled 2023-11-14: qty 180, 90d supply, fill #0

## 2023-11-14 NOTE — Patient Instructions (Signed)
 Medication Instructions:   .Your physician recommends that you continue on your current medications as directed. Please refer to the Current Medication list given to you today.   *If you need a refill on your cardiac medications before your next appointment, please call your pharmacy*   Lab Work:  PLEASE GO DOWN STAIRS  [facility]  FIRST FLOOR  [address] ( GET OFF ELEVATORS MAKE A LEFT AND ANOTHER LEFT LAB ON RIGHT DOWN HALL[address] : BMET AND CBC TODAY    If you have labs (blood work) drawn today and your tests are completely normal, you will receive your results only by: MyChart Message (if you have MyChart) OR A paper copy in the mail If you have any lab test that is abnormal or we need to change your treatment, we will call you to review the results.    Testing/Procedures: NONE ORDERED  TODAY      Follow-Up: At [facility], you and your health needs are our priority.  As part of our continuing mission to provide you with exceptional heart care, we have created designated Provider Care Teams.  These Care Teams include your primary Cardiologist (physician) and Advanced Practice Providers (APPs -  Physician Assistants and Nurse Practitioners) who all work together to provide you with the care you need, when you need it.  We recommend signing up for the patient portal called "MyChart".  Sign up information is provided on this After Visit Summary.  MyChart is used to connect with patients for Virtual Visits (Telemedicine).  Patients are able to view lab/test results, encounter notes, upcoming appointments, etc.  Non-urgent messages can be sent to your provider as well.   To learn more about what you can do with MyChart, go to [email or web address].    Your next appointment:    6 month(s)   Provider:    You may see Will Jorja Loa, MD or one of the following Advanced Practice Providers on your designated Care Team:   Francis Dowse, [place] [address]" [place], [place] Sherie Don, NP Canary Brim, NP    Other Instructions

## 2023-11-14 NOTE — Progress Notes (Signed)
  Electrophysiology Office Note:   Date:  11/14/2023  ID:  James Mayo, DOB 07/01/1951, MRN [id number]  Primary Cardiologist: James Muss, MD Electrophysiologist: James Lemming, MD      History of Present Illness:   James Mayo is a 73 y.o. male with h/o CAD, AF, and h/o Aortic valve/root replacement  seen today for routine electrophysiology followup.   Since last being seen in our clinic the patient reports doing well overall. He states that he stopped taking his Eliquis shortly after visiting with Dr. Elberta Mayo last year. He has not had any further AF by symptoms or apple watch, and is disinclined to resume eliquis. He does verbalize understanding of risk of stroke. Currently,  he denies chest pain, palpitations, dyspnea, PND, orthopnea, nausea, vomiting, dizziness, syncope, edema, weight gain, or early satiety.   Review of systems complete and found to be negative unless listed in HPI.   EP Information / Studies Reviewed:    EKG is ordered today. Personal review as below.  EKG Interpretation Date/Time:  Tuesday November 14 2023 12:13:27 EST Ventricular Rate:  52 PR Interval:  200 QRS Duration:  108 QT Interval:  434 QTC Calculation: 403 R Axis:   69  Text Interpretation: Sinus bradycardia with Premature supraventricular complexes When compared with ECG of 15-Nov-2022 13:23, Questionable change in QRS axis Confirmed by James Mayo [phone]) on 11/14/2023 12:18:02 PM    Arrhythmia/Device History S/p PVI ablation 10/18/2022   Physical Exam:   VS:  BP (!) 140/62   Pulse (!) 52   Ht 5\' 7"  (1.702 m)   Wt 135 lb (61.2 kg)   SpO2 98%   BMI 21.14 kg/m    Wt Readings from Last 3 Encounters:  11/14/23 135 lb (61.2 kg)  10/04/23 134 lb (60.8 kg)  06/20/23 134 lb (60.8 kg)     GEN: No acute distress NECK: No JVD; No carotid bruits CARDIAC: Regular rate and rhythm, no murmurs, rubs, gallops RESPIRATORY:  Clear to auscultation without rales, wheezing or rhonchi  ABDOMEN:  Soft, non-tender, non-distended EXTREMITIES:  No edema; No deformity   ASSESSMENT AND PLAN:    Persistent AF S/p ablation 10/2022 He declines Eliquis despite CHA2DS2/VASc of at least 3 We discussed risk of stroke at length. He uses an apple watch and will continue to monitor. I will refill his Eliquis, and he will restart it if he has AF. He understands while studies are underway, there is no firm data on "pill in pocket" anticoagulation, and he will still be at higher risk of stroke than someone who has not had AF.   Secondary hypercoagulable state Have previously discussed Watchman. He will let us know if he decides on a consult.   CAD No s/s ischemia    H/o AV / root replacement Stable on prior echo 01/25/2022   Follow up with Dr. Elberta Mayo in 6 months  Signed, Graciella Freer, PA-C

## 2023-11-15 LAB — CBC
Hematocrit: 44.1 % (ref 37.5–51.0)
Hemoglobin: 14.5 g/dL (ref 13.0–17.7)
MCH: 31.9 pg (ref 26.6–33.0)
MCHC: 32.9 g/dL (ref 31.5–35.7)
MCV: 97 fL (ref 79–97)
Platelets: 143 10*3/uL — ABNORMAL LOW (ref 150–450)
RBC: 4.54 x10E6/uL (ref 4.14–5.80)
RDW: 13.4 % (ref 11.6–15.4)
WBC: 6.2 10*3/uL (ref 3.4–10.8)

## 2023-11-15 LAB — BASIC METABOLIC PANEL WITH GFR
BUN/Creatinine Ratio: 33 — ABNORMAL HIGH (ref 10–24)
BUN: 28 mg/dL — ABNORMAL HIGH (ref 8–27)
CO2: 26 mmol/L (ref 20–29)
Calcium: 9.4 mg/dL (ref 8.6–10.2)
Chloride: 104 mmol/L (ref 96–106)
Creatinine, Ser: 0.84 mg/dL (ref 0.76–1.27)
Glucose: 94 mg/dL (ref 70–99)
Potassium: 4.4 mmol/L (ref 3.5–5.2)
Sodium: 143 mmol/L (ref 134–144)
eGFR: 93 mL/min/1.73

## 2023-11-19 DIAGNOSIS — H353112 Nonexudative age-related macular degeneration, right eye, intermediate dry stage: Secondary | ICD-10-CM | POA: Diagnosis not present

## 2023-11-21 ENCOUNTER — Other Ambulatory Visit (HOSPITAL_COMMUNITY): Payer: Self-pay

## 2023-11-21 DIAGNOSIS — H353221 Exudative age-related macular degeneration, left eye, with active choroidal neovascularization: Secondary | ICD-10-CM | POA: Diagnosis not present

## 2023-12-01 DIAGNOSIS — L570 Actinic keratosis: Secondary | ICD-10-CM | POA: Diagnosis not present

## 2023-12-19 DIAGNOSIS — H353221 Exudative age-related macular degeneration, left eye, with active choroidal neovascularization: Secondary | ICD-10-CM | POA: Diagnosis not present

## 2023-12-19 DIAGNOSIS — H353112 Nonexudative age-related macular degeneration, right eye, intermediate dry stage: Secondary | ICD-10-CM | POA: Diagnosis not present

## 2024-01-10 ENCOUNTER — Other Ambulatory Visit (HOSPITAL_COMMUNITY): Payer: Self-pay

## 2024-01-10 MED ORDER — SILDENAFIL CITRATE 20 MG PO TABS
20.0000 mg | ORAL_TABLET | Freq: Every day | ORAL | 0 refills | Status: DC | PRN
  Filled 2024-01-10: qty 30, 6d supply, fill #0

## 2024-01-16 DIAGNOSIS — H43821 Vitreomacular adhesion, right eye: Secondary | ICD-10-CM | POA: Diagnosis not present

## 2024-01-16 DIAGNOSIS — H353221 Exudative age-related macular degeneration, left eye, with active choroidal neovascularization: Secondary | ICD-10-CM | POA: Diagnosis not present

## 2024-01-16 DIAGNOSIS — H353112 Nonexudative age-related macular degeneration, right eye, intermediate dry stage: Secondary | ICD-10-CM | POA: Diagnosis not present

## 2024-01-16 DIAGNOSIS — H2513 Age-related nuclear cataract, bilateral: Secondary | ICD-10-CM | POA: Diagnosis not present

## 2024-01-16 DIAGNOSIS — H43812 Vitreous degeneration, left eye: Secondary | ICD-10-CM | POA: Diagnosis not present

## 2024-01-18 DIAGNOSIS — H353112 Nonexudative age-related macular degeneration, right eye, intermediate dry stage: Secondary | ICD-10-CM | POA: Diagnosis not present

## 2024-01-23 DIAGNOSIS — D7589 Other specified diseases of blood and blood-forming organs: Secondary | ICD-10-CM | POA: Diagnosis not present

## 2024-01-23 DIAGNOSIS — E039 Hypothyroidism, unspecified: Secondary | ICD-10-CM | POA: Diagnosis not present

## 2024-01-23 DIAGNOSIS — N529 Male erectile dysfunction, unspecified: Secondary | ICD-10-CM | POA: Diagnosis not present

## 2024-01-23 DIAGNOSIS — Z125 Encounter for screening for malignant neoplasm of prostate: Secondary | ICD-10-CM | POA: Diagnosis not present

## 2024-01-23 DIAGNOSIS — I10 Essential (primary) hypertension: Secondary | ICD-10-CM | POA: Diagnosis not present

## 2024-01-23 DIAGNOSIS — Z1331 Encounter for screening for depression: Secondary | ICD-10-CM | POA: Diagnosis not present

## 2024-01-23 DIAGNOSIS — Z0000 Encounter for general adult medical examination without abnormal findings: Secondary | ICD-10-CM | POA: Diagnosis not present

## 2024-01-23 DIAGNOSIS — M8580 Other specified disorders of bone density and structure, unspecified site: Secondary | ICD-10-CM | POA: Diagnosis not present

## 2024-01-24 DIAGNOSIS — H353221 Exudative age-related macular degeneration, left eye, with active choroidal neovascularization: Secondary | ICD-10-CM | POA: Diagnosis not present

## 2024-01-24 DIAGNOSIS — H2513 Age-related nuclear cataract, bilateral: Secondary | ICD-10-CM | POA: Diagnosis not present

## 2024-01-24 DIAGNOSIS — H52203 Unspecified astigmatism, bilateral: Secondary | ICD-10-CM | POA: Diagnosis not present

## 2024-01-24 DIAGNOSIS — H353112 Nonexudative age-related macular degeneration, right eye, intermediate dry stage: Secondary | ICD-10-CM | POA: Diagnosis not present

## 2024-02-17 DIAGNOSIS — H353112 Nonexudative age-related macular degeneration, right eye, intermediate dry stage: Secondary | ICD-10-CM | POA: Diagnosis not present

## 2024-02-27 DIAGNOSIS — H353221 Exudative age-related macular degeneration, left eye, with active choroidal neovascularization: Secondary | ICD-10-CM | POA: Diagnosis not present

## 2024-02-28 ENCOUNTER — Other Ambulatory Visit (HOSPITAL_COMMUNITY): Payer: Self-pay

## 2024-02-28 MED ORDER — LEVOTHYROXINE SODIUM 125 MCG PO TABS
125.0000 ug | ORAL_TABLET | Freq: Every day | ORAL | 3 refills | Status: DC
  Filled 2024-02-28: qty 90, 90d supply, fill #0

## 2024-03-04 ENCOUNTER — Other Ambulatory Visit (HOSPITAL_COMMUNITY): Payer: Self-pay

## 2024-03-11 DIAGNOSIS — J189 Pneumonia, unspecified organism: Secondary | ICD-10-CM | POA: Diagnosis not present

## 2024-03-11 DIAGNOSIS — E039 Hypothyroidism, unspecified: Secondary | ICD-10-CM | POA: Diagnosis not present

## 2024-03-11 DIAGNOSIS — I480 Paroxysmal atrial fibrillation: Secondary | ICD-10-CM | POA: Diagnosis not present

## 2024-03-18 DIAGNOSIS — H353112 Nonexudative age-related macular degeneration, right eye, intermediate dry stage: Secondary | ICD-10-CM | POA: Diagnosis not present

## 2024-03-28 DIAGNOSIS — F4322 Adjustment disorder with anxiety: Secondary | ICD-10-CM | POA: Diagnosis not present

## 2024-04-02 ENCOUNTER — Other Ambulatory Visit (HOSPITAL_COMMUNITY): Payer: Self-pay

## 2024-04-02 MED ORDER — SILDENAFIL CITRATE 20 MG PO TABS
20.0000 mg | ORAL_TABLET | Freq: Every day | ORAL | 1 refills | Status: AC | PRN
  Filled 2024-04-02: qty 30, 6d supply, fill #0

## 2024-04-05 ENCOUNTER — Other Ambulatory Visit (HOSPITAL_COMMUNITY): Payer: Self-pay

## 2024-04-05 DIAGNOSIS — B353 Tinea pedis: Secondary | ICD-10-CM | POA: Diagnosis not present

## 2024-04-05 MED ORDER — NYSTATIN 100000 UNIT/GM EX POWD
1.0000 | Freq: Three times a day (TID) | CUTANEOUS | 0 refills | Status: DC
  Filled 2024-04-05: qty 15, 5d supply, fill #0

## 2024-04-11 DIAGNOSIS — E039 Hypothyroidism, unspecified: Secondary | ICD-10-CM | POA: Diagnosis not present

## 2024-04-11 DIAGNOSIS — J189 Pneumonia, unspecified organism: Secondary | ICD-10-CM | POA: Diagnosis not present

## 2024-04-11 DIAGNOSIS — I480 Paroxysmal atrial fibrillation: Secondary | ICD-10-CM | POA: Diagnosis not present

## 2024-04-12 DIAGNOSIS — F4322 Adjustment disorder with anxiety: Secondary | ICD-10-CM | POA: Diagnosis not present

## 2024-04-17 DIAGNOSIS — H353112 Nonexudative age-related macular degeneration, right eye, intermediate dry stage: Secondary | ICD-10-CM | POA: Diagnosis not present

## 2024-04-30 DIAGNOSIS — H353221 Exudative age-related macular degeneration, left eye, with active choroidal neovascularization: Secondary | ICD-10-CM | POA: Diagnosis not present

## 2024-05-06 ENCOUNTER — Other Ambulatory Visit (HOSPITAL_COMMUNITY): Payer: Self-pay

## 2024-05-06 DIAGNOSIS — F4322 Adjustment disorder with anxiety: Secondary | ICD-10-CM | POA: Diagnosis not present

## 2024-05-06 MED ORDER — LEVOTHYROXINE SODIUM 125 MCG PO TABS
125.0000 ug | ORAL_TABLET | Freq: Every day | ORAL | 0 refills | Status: DC
  Filled 2024-05-06: qty 90, 90d supply, fill #0

## 2024-05-12 DIAGNOSIS — E039 Hypothyroidism, unspecified: Secondary | ICD-10-CM | POA: Diagnosis not present

## 2024-05-12 DIAGNOSIS — J189 Pneumonia, unspecified organism: Secondary | ICD-10-CM | POA: Diagnosis not present

## 2024-05-12 DIAGNOSIS — I480 Paroxysmal atrial fibrillation: Secondary | ICD-10-CM | POA: Diagnosis not present

## 2024-05-15 ENCOUNTER — Other Ambulatory Visit (HOSPITAL_COMMUNITY): Payer: Self-pay

## 2024-05-17 DIAGNOSIS — H353112 Nonexudative age-related macular degeneration, right eye, intermediate dry stage: Secondary | ICD-10-CM | POA: Diagnosis not present

## 2024-06-11 DIAGNOSIS — I480 Paroxysmal atrial fibrillation: Secondary | ICD-10-CM | POA: Diagnosis not present

## 2024-06-11 DIAGNOSIS — J189 Pneumonia, unspecified organism: Secondary | ICD-10-CM | POA: Diagnosis not present

## 2024-06-11 DIAGNOSIS — E039 Hypothyroidism, unspecified: Secondary | ICD-10-CM | POA: Diagnosis not present

## 2024-07-09 ENCOUNTER — Other Ambulatory Visit: Payer: Self-pay

## 2024-07-09 ENCOUNTER — Other Ambulatory Visit (HOSPITAL_COMMUNITY): Payer: Self-pay

## 2024-07-09 MED ORDER — AMOXICILLIN 500 MG PO CAPS
2000.0000 mg | ORAL_CAPSULE | ORAL | 0 refills | Status: DC
  Filled 2024-07-09: qty 8, 2d supply, fill #0

## 2024-07-16 ENCOUNTER — Ambulatory Visit: Admitting: Student

## 2024-07-16 DIAGNOSIS — H353221 Exudative age-related macular degeneration, left eye, with active choroidal neovascularization: Secondary | ICD-10-CM | POA: Diagnosis not present

## 2024-07-16 DIAGNOSIS — H43821 Vitreomacular adhesion, right eye: Secondary | ICD-10-CM | POA: Diagnosis not present

## 2024-07-16 DIAGNOSIS — H43812 Vitreous degeneration, left eye: Secondary | ICD-10-CM | POA: Diagnosis not present

## 2024-07-16 DIAGNOSIS — H2513 Age-related nuclear cataract, bilateral: Secondary | ICD-10-CM | POA: Diagnosis not present

## 2024-07-16 DIAGNOSIS — H353112 Nonexudative age-related macular degeneration, right eye, intermediate dry stage: Secondary | ICD-10-CM | POA: Diagnosis not present

## 2024-07-22 DIAGNOSIS — R350 Frequency of micturition: Secondary | ICD-10-CM | POA: Diagnosis not present

## 2024-07-22 DIAGNOSIS — N390 Urinary tract infection, site not specified: Secondary | ICD-10-CM | POA: Diagnosis not present

## 2024-07-22 NOTE — Progress Notes (Unsigned)
  Electrophysiology Office Note:   Date:  07/23/2024  ID:  James Mayo, DOB 11-13-1950, MRN [id number]  Primary Cardiologist: Candyce Reek, MD Electrophysiologist: Will Gladis Norton, MD   Electrophysiologist:  Soyla Gladis Norton, MD      History of Present Illness:   James Mayo is a 73 y.o. male with h/o CAD, atrial fibrillation, aortic valve/root replacement seen today for routine electrophysiology followup.   At last visit with Jodie Passey in March of this year, it was noted that patient had discontinued his Eliquis  following May 2024 visit with Dr. Norton. He had not had any further AF by symptoms or via Apple Watch monitoring. Stroke risk was discussed and patient expressed preference to remain off Eliquis .   Since last being seen in our clinic the patient reports doing very well.  he denies chest pain, palpitations, dyspnea, PND, orthopnea, nausea, vomiting, dizziness, syncope, edema, weight gain, or early satiety. 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 He wears his Apple Watch daily and reports no notifications for atrial fibrillation, no symptoms suggestive of recurrent arrhythmia.   Review of systems complete and found to be negative unless listed in HPI.   EP Information / Studies Reviewed:    EKG is ordered today. Personal review as below.  EKG Interpretation Date/Time:  Tuesday July 23 2024 10:11:15 EST Ventricular Rate:  52 PR Interval:  174 QRS Duration:  110 QT Interval:  434 QTC Calculation: 403 R Axis:   70  Text Interpretation: Sinus bradycardia with sinus arrhythmia Minimal voltage criteria for LVH, may be normal variant ( Sokolow-Lyon ) Confirmed by Trudy [place] [id number]) on 07/23/2024 10:15:29 AM    Arrhythmia/Device History S/p PVI ablation 10/18/2022   Physical Exam:   VS:  BP 114/70   Pulse (!) 52   Ht 5' 7 (1.702 m)   Wt 134 lb (60.8 kg)   SpO2 99%   BMI 20.99 kg/m    Wt Readings from Last 3 Encounters:   07/23/24 134 lb (60.8 kg)  11/14/23 135 lb (61.2 kg)  10/04/23 134 lb (60.8 kg)     GEN: No acute distress NECK: No JVD CARDIAC: Regular rate and rhythm. Systolic murmur over LUSB RESPIRATORY:  Clear to auscultation without rales, wheezing or rhonchi  ABDOMEN: Soft, non-tender, non-distended EXTREMITIES:  No edema; No deformity   ASSESSMENT AND PLAN:    Persistent atrial fibrillation Secondary hypercoagulable state S/P afib ablation in February 2024. No recurrent afib since by ECG, Apple Watch monitoring, or symptoms. Patient has been off Eliquis  by personal choice since 2024 and continues to prefer to remain off of this citing concern for major bleed with his high level of physical activity. Discussed elevated stroke risk and Watchman closure as alternative to OAC. Patient also aware that his statistical risk for stroke will increase at age 41. For now, patient states he will remain off Eliquis . He does have a current prescription at home and states he will start taking if his Apple Watch alerts for afib.   CAD Patient regularly exercises without anginal symptoms.   S/P aortic root replacement Stable on 2023 echocardiogram. Given bioprosthetic valve, will repeat TTE to monitor.      Follow up with Dr. Norton in 12 months  Signed, [place] Trudy, PA-C

## 2024-07-23 ENCOUNTER — Ambulatory Visit: Attending: Student | Admitting: Cardiology

## 2024-07-23 ENCOUNTER — Encounter: Payer: Self-pay | Admitting: Cardiology

## 2024-07-23 VITALS — BP 114/70 | HR 52 | Ht 67.0 in | Wt 134.0 lb

## 2024-07-23 DIAGNOSIS — Z952 Presence of prosthetic heart valve: Secondary | ICD-10-CM | POA: Diagnosis not present

## 2024-07-23 DIAGNOSIS — I4819 Other persistent atrial fibrillation: Secondary | ICD-10-CM | POA: Diagnosis not present

## 2024-07-23 NOTE — Patient Instructions (Signed)
 Medication Instructions:  Your physician recommends that you continue on your current medications as directed. Please refer to the Current Medication list given to you today.  *If you need a refill on your cardiac medications before your next appointment, please call your pharmacy*  Lab Work: None ordered If you have labs (blood work) drawn today and your tests are completely normal, you will receive your results only by: MyChart Message (if you have MyChart) OR A paper copy in the mail If you have any lab test that is abnormal or we need to change your treatment, we will call you to review the results.  Follow-Up: At [facility], you and your health needs are our priority.  As part of our continuing mission to provide you with exceptional heart care, our providers are all part of one team.  This team includes your primary Cardiologist (physician) and Advanced Practice Providers or APPs (Physician Assistants and Nurse Practitioners) who all work together to provide you with the care you need, when you need it.  Your next appointment:   1 year(s)  Provider:   Agatha Horsfall, MD

## 2024-07-30 DIAGNOSIS — H52203 Unspecified astigmatism, bilateral: Secondary | ICD-10-CM | POA: Diagnosis not present

## 2024-07-30 DIAGNOSIS — H2513 Age-related nuclear cataract, bilateral: Secondary | ICD-10-CM | POA: Diagnosis not present

## 2024-08-15 DIAGNOSIS — H353112 Nonexudative age-related macular degeneration, right eye, intermediate dry stage: Secondary | ICD-10-CM | POA: Diagnosis not present

## 2024-08-20 ENCOUNTER — Other Ambulatory Visit: Payer: Self-pay | Admitting: Surgery

## 2024-08-20 DIAGNOSIS — R918 Other nonspecific abnormal finding of lung field: Secondary | ICD-10-CM

## 2024-08-20 DIAGNOSIS — I7121 Aneurysm of the ascending aorta, without rupture: Secondary | ICD-10-CM

## 2024-09-24 ENCOUNTER — Ambulatory Visit (HOSPITAL_COMMUNITY)
Admission: RE | Admit: 2024-09-24 | Discharge: 2024-09-24 | Disposition: A | Discharge status: Home / Self Care | Source: Ambulatory Visit | Attending: Cardiology | Admitting: Cardiology

## 2024-09-24 ENCOUNTER — Ambulatory Visit (HOSPITAL_COMMUNITY)
Admission: RE | Admit: 2024-09-24 | Discharge: 2024-09-24 | Disposition: A | Source: Ambulatory Visit | Attending: Surgery | Admitting: Surgery

## 2024-09-24 DIAGNOSIS — R918 Other nonspecific abnormal finding of lung field: Secondary | ICD-10-CM | POA: Insufficient documentation

## 2024-09-24 DIAGNOSIS — I7121 Aneurysm of the ascending aorta, without rupture: Secondary | ICD-10-CM

## 2024-09-24 DIAGNOSIS — Z952 Presence of prosthetic heart valve: Secondary | ICD-10-CM | POA: Insufficient documentation

## 2024-09-24 LAB — ECHOCARDIOGRAM COMPLETE
AR max vel: 0.87 cm2
AV Area VTI: 1.01 cm2
AV Area mean vel: 0.94 cm2
AV Mean grad: 29.2 mmHg
AV Peak grad: 51.6 mmHg
Ao pk vel: 3.59 m/s
Area-P 1/2: 3.96 cm2
S' Lateral: 3 cm

## 2024-09-24 MED ORDER — IOHEXOL 350 MG/ML SOLN
75.0000 mL | Freq: Once | INTRAVENOUS | Status: AC | PRN
  Administered 2024-09-24: 75 mL via INTRAVENOUS

## 2024-09-26 ENCOUNTER — Ambulatory Visit: Payer: Self-pay | Admitting: Cardiology
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
## 2024-10-04 ENCOUNTER — Ambulatory Visit: Attending: Cardiovascular Disease | Admitting: Cardiovascular Disease

## 2024-10-04 ENCOUNTER — Encounter: Payer: Self-pay | Admitting: Cardiovascular Disease

## 2024-10-04 ENCOUNTER — Other Ambulatory Visit: Payer: Self-pay | Admitting: Physician Assistant

## 2024-10-04 VITALS — BP 134/80 | HR 55 | Ht 67.0 in | Wt 135.4 lb

## 2024-10-04 DIAGNOSIS — I4819 Other persistent atrial fibrillation: Secondary | ICD-10-CM

## 2024-10-04 DIAGNOSIS — I2510 Atherosclerotic heart disease of native coronary artery without angina pectoris: Secondary | ICD-10-CM | POA: Diagnosis not present

## 2024-10-04 DIAGNOSIS — T82857D Stenosis of cardiac prosthetic devices, implants and grafts, subsequent encounter: Secondary | ICD-10-CM

## 2024-10-04 DIAGNOSIS — T82857A Stenosis of cardiac prosthetic devices, implants and grafts, initial encounter: Secondary | ICD-10-CM

## 2024-10-04 DIAGNOSIS — Z952 Presence of prosthetic heart valve: Secondary | ICD-10-CM | POA: Diagnosis not present

## 2024-10-04 LAB — BASIC METABOLIC PANEL WITH GFR
BUN/Creatinine Ratio: 27 — ABNORMAL HIGH (ref 10–24)
BUN: 21 mg/dL (ref 8–27)
CO2: 24 mmol/L (ref 20–29)
Calcium: 9.3 mg/dL (ref 8.6–10.2)
Chloride: 102 mmol/L (ref 96–106)
Creatinine, Ser: 0.78 mg/dL (ref 0.76–1.27)
Glucose: 95 mg/dL (ref 70–99)
Potassium: 4.5 mmol/L (ref 3.5–5.2)
Sodium: 140 mmol/L (ref 134–144)
eGFR: 94 mL/min/1.73

## 2024-10-04 NOTE — Patient Instructions (Addendum)
 Medication Instructions:  Your physician recommends that you continue on your current medications as directed. Please refer to the Current Medication list given to you today.  *If you need a refill on your cardiac medications before your next appointment, please call your pharmacy*  Lab Work: Today- BMET  If you have labs (blood work) drawn today and your tests are completely normal, you will receive your results only by: MyChart Message (if you have MyChart) OR A paper copy in the mail If you have any lab test that is abnormal or we need to change your treatment, we will call you to review the results.  Testing/Procedures: See below  Follow-Up: At [facility], you and your health needs are our priority.  As part of our continuing mission to provide you with exceptional heart care, our providers are all part of one team.  This team includes your primary Cardiologist (physician) and Advanced Practice Providers or APPs (Physician Assistants and Nurse Practitioners) who all work together to provide you with the care you need, when you need it.  Your next appointment:   6 month(s)  Provider:   Darryle ONEIDA Decent, MD    We recommend signing up for the patient portal called MyChart.  Sign up information is provided on this After Visit Summary.  MyChart is used to connect with patients for Virtual Visits (Telemedicine).  Patients are able to view lab/test results, encounter notes, upcoming appointments, etc.  Non-urgent messages can be sent to your provider as well.   To learn more about what you can do with MyChart, go to [email or web address].   Other Instructions   Your cardiac CT will be scheduled at the below location:    [facility] [address]  [place], [place] [address]   If scheduled at the [facility] at [address], please enter the parking lot using the [address] entrance and use the FREE valet service at the  patient drop-off area. Enter the building and check-in with registration on the main floor.   Please follow these instructions carefully (unless otherwise directed):  An IV will be required for this test and Nitroglycerin  will be given.  Hold all erectile dysfunction medications at least 3 days (72 hrs) prior to test. (Ie viagra , cialis, sildenafil , tadalafil, etc)   On the Night Before the Test: Be sure to Drink plenty of water. Do not consume any caffeinated/decaffeinated beverages or chocolate 12 hours prior to your test. Do not take any antihistamines 12 hours prior to your test.  On the Day of the Test: Drink plenty of water until 1 hour prior to the test. Do not eat any food 1 hour prior to test. You may take your regular medications prior to the test.  Patients who wear a continuous glucose monitor MUST remove the device prior to scanning.       After the Test: Drink plenty of water. After receiving IV contrast, you may experience a mild flushed feeling. This is normal. On occasion, you may experience a mild rash up to 24 hours after the test. This is not dangerous. If this occurs, you can take Benadryl 25 mg, Zyrtec, Claritin, or Allegra and increase your fluid intake. (Patients taking Tikosyn should avoid Benadryl, and may take Zyrtec, Claritin, or Allegra) If you experience trouble breathing, this can be serious. If it is severe call 911 IMMEDIATELY. If it is mild, please call our office.  We will call to schedule your test  2-4 weeks out understanding that some insurance companies will need an authorization prior to the service being performed.   For more information and frequently asked questions, please visit our website : [email or web address]  For non-scheduling related questions, please contact the cardiac imaging nurse navigator should you have any questions/concerns: Cardiac Imaging Nurse Navigators Direct Office Dial: [phone]   For scheduling  needs, including cancellations and rescheduling, please call Brittany, [phone].

## 2024-10-06 ENCOUNTER — Ambulatory Visit: Payer: Self-pay | Admitting: Cardiovascular Disease

## 2024-10-08 ENCOUNTER — Ambulatory Visit (HOSPITAL_COMMUNITY)
Admission: RE | Admit: 2024-10-08 | Discharge: 2024-10-08 | Disposition: A | Discharge status: Home / Self Care | Source: Ambulatory Visit | Attending: Cardiovascular Disease | Admitting: Cardiovascular Disease

## 2024-10-08 DIAGNOSIS — I771 Stricture of artery: Secondary | ICD-10-CM | POA: Insufficient documentation

## 2024-10-08 DIAGNOSIS — N3289 Other specified disorders of bladder: Secondary | ICD-10-CM | POA: Diagnosis not present

## 2024-10-08 DIAGNOSIS — T82857A Stenosis of cardiac prosthetic devices, implants and grafts, initial encounter: Secondary | ICD-10-CM | POA: Diagnosis not present

## 2024-10-08 DIAGNOSIS — Z01818 Encounter for other preprocedural examination: Secondary | ICD-10-CM | POA: Diagnosis present

## 2024-10-08 DIAGNOSIS — Z952 Presence of prosthetic heart valve: Secondary | ICD-10-CM | POA: Insufficient documentation

## 2024-10-08 DIAGNOSIS — Z95818 Presence of other cardiac implants and grafts: Secondary | ICD-10-CM | POA: Insufficient documentation

## 2024-10-08 DIAGNOSIS — Z01810 Encounter for preprocedural cardiovascular examination: Secondary | ICD-10-CM | POA: Diagnosis not present

## 2024-10-08 DIAGNOSIS — R933 Abnormal findings on diagnostic imaging of other parts of digestive tract: Secondary | ICD-10-CM | POA: Insufficient documentation

## 2024-10-08 DIAGNOSIS — I714 Abdominal aortic aneurysm, without rupture, unspecified: Secondary | ICD-10-CM | POA: Insufficient documentation

## 2024-10-08 DIAGNOSIS — I7121 Aneurysm of the ascending aorta, without rupture: Secondary | ICD-10-CM | POA: Diagnosis not present

## 2024-10-08 DIAGNOSIS — N400 Enlarged prostate without lower urinary tract symptoms: Secondary | ICD-10-CM | POA: Insufficient documentation

## 2024-10-08 DIAGNOSIS — I723 Aneurysm of iliac artery: Secondary | ICD-10-CM | POA: Diagnosis not present

## 2024-10-08 DIAGNOSIS — I2510 Atherosclerotic heart disease of native coronary artery without angina pectoris: Secondary | ICD-10-CM | POA: Diagnosis not present

## 2024-10-08 MED ORDER — NITROGLYCERIN 0.4 MG SL SUBL
0.8000 mg | SUBLINGUAL_TABLET | Freq: Once | SUBLINGUAL | Status: AC
  Administered 2024-10-08: 0.8 mg via SUBLINGUAL

## 2024-10-08 MED ORDER — IOHEXOL 350 MG/ML SOLN
100.0000 mL | Freq: Once | INTRAVENOUS | Status: AC | PRN
  Administered 2024-10-08: 100 mL via INTRAVENOUS

## 2024-10-09 ENCOUNTER — Other Ambulatory Visit (HOSPITAL_COMMUNITY)

## 2024-10-09 ENCOUNTER — Ambulatory Visit: Attending: Surgery | Admitting: Surgery

## 2024-10-09 ENCOUNTER — Encounter: Payer: Self-pay | Admitting: Surgery

## 2024-10-09 VITALS — BP 148/77 | HR 56 | Resp 18 | Ht 67.0 in | Wt 137.0 lb

## 2024-10-09 DIAGNOSIS — I350 Nonrheumatic aortic (valve) stenosis: Secondary | ICD-10-CM

## 2024-10-09 NOTE — Progress Notes (Signed)
 Patient ID: James Mayo, male   DOB: October 30, 1950, 74 y.o.   MRN: [id number]  [facility]   MULTIDISCIPLINARY HEART VALVE CLINIC         CARDIOTHORACIC SURGERY CONSULTATION REPORT  PCP is Cleotilde Planas, MD Referring Provider is Darryle ONEIDA Decent, MD Primary Cardiologist is Darryle ONEIDA Decent, MD  Reason for consultation: Severe prosthetic aortic valve stenosis  HPI:  The patient is a 74 year old gentleman who underwent aortic valve replacement with a 25 mm Edwards model [id number] pericardial valve and supracoronary replacement of the ascending aorta under hypothermic circulatory arrest by Dr. Army on 03/08/2017 for a bicuspid aortic valve with at least moderate aortic insufficiency and a 5.3 cm ascending aortic aneurysm.  I have been following him since Dr. Collie retirement.  I last saw him 10/04/2023 at which time the CTA of the chest showed the aortic root to have a diameter of 4.8 cm which was unchanged.  His most recent echocardiogram on 09/24/2024 showed the aortic valve mean gradient to be 29.2 mmHg.  Peak gradient was 51.6 mmHg.  Aortic valve area by VTI was 1.01 cm.  There was trivial regurgitation.  Left ventricular ejection fraction was 60 to 65% with severe LVH of the septal segment.  Previous echocardiogram on 01/25/2022 showed a mean gradient across the aortic valve of 13.6 mmHg but in March 2022 it was measured at 27 mmHg.  In January 2021 the gradient was measured at 33 mmHg.  A gated cardiac CTA was done yesterday to evaluate the valve and showed thickened leaflets with severe restriction of the leaflet and the noncoronary position the noncoronary sinus was measured at 48 mm.  Right coronary sinus was 42 and left coronary sinus was 44.  The patient remains active and exercises almost every day.  He walks on a treadmill, rides an indoor bicycle and swims.  He also does weightlifting.  He has noted a decrease in his exertional tolerance over the years but currently denies  any shortness of breath or fatigue with his usual activity.  He feels like this is an age appropriate decrease. He has had occasional episodes of lightheadedness but no syncope. This has occurred with bending over to pick something up but not exertional. He denies any peripheral edema or orthopnea.  Past Medical History:  Diagnosis Date   Aortic valve disorder 03/06/2017    S/p AVR with Edwards pericardial tissue valve and replacement of the ascending aorta with a 32 mm Hemashield Side Arm Graft   BASAL CELL CARCINOMA, FACE 08/19/2009   Qualifier: Diagnosis of By: HARVEY MD, KARL     BASAL CELL CARCINOMA, FACE 08/19/2009   Qualifier: Diagnosis of  By: HARVEY MD, KARL     Coronary artery disease    Metatarsalgia of left foot 04/29/2014   Neck pain, bilateral 11/08/2013   PAF (paroxysmal atrial fibrillation) (HCC) 03/09/2017   RHINITIS, ALLERGIC 11/09/2006   Qualifier: Diagnosis of  By: Manford Longs     SHOULDER PAIN, RIGHT 05/06/2009   Qualifier: Diagnosis of  By: HARVEY MD, KARL     Thoracic ascending aortic aneurysm    Thyroid  condition     Past Surgical History:  Procedure Laterality Date   AORTIC VALVE REPLACEMENT  03/06/2017   Procedure: AORTIC VALVE REPLACEMENT (AVR) using Magna Ease valve Size 25mm;  Surgeon: Army Dallas NOVAK, MD;  Location: [facility] OR;  Service: Open Heart Surgery;;   ATRIAL FIBRILLATION ABLATION N/A 10/18/2022   Procedure: ATRIAL FIBRILLATION ABLATION;  Surgeon:  Inocencio Soyla Lunger, MD;  Location: [facility] INVASIVE CV LAB;  Service: Cardiovascular;  Laterality: N/A;   CARDIAC CATHETERIZATION     CARDIOVERSION N/A 04/14/2017   Procedure: CARDIOVERSION;  Surgeon: Rolan Ezra RAMAN, MD;  Location: [facility] ENDOSCOPY;  Service: Cardiovascular;  Laterality: N/A;   CARDIOVERSION N/A 02/03/2022   Procedure: CARDIOVERSION;  Surgeon: Lonni Slain, MD;  Location: [facility] ENDOSCOPY;  Service: Cardiovascular;  Laterality: N/A;   CARDIOVERSION N/A 03/21/2022   Procedure: CARDIOVERSION;   Surgeon: Barbaraann Darryle Ned, MD;  Location: [facility] ENDOSCOPY;  Service: Cardiovascular;  Laterality: N/A;   EYE SURGERY Bilateral    LASIK 10 years   REPLACEMENT ASCENDING AORTA N/A 03/06/2017   Procedure: REPLACEMENT ASCENDING AORTA using 32mm Hemashield Graft - with Hypothermic Circulatory Arrest;  Surgeon: Army Dallas NOVAK, MD;  Location: [facility] OR;  Service: Open Heart Surgery;  Laterality: N/A;   RIGHT/LEFT HEART CATH AND CORONARY ANGIOGRAPHY N/A 01/25/2017   Procedure: Right/Left Heart Cath and Coronary Angiography;  Surgeon: Burnard Ned LABOR, MD;  Location: [facility] INVASIVE CV LAB;  Service: Cardiovascular;  Laterality: N/A;   TEE WITHOUT CARDIOVERSION N/A 03/06/2017   Procedure: TRANSESOPHAGEAL ECHOCARDIOGRAM (TEE);  Surgeon: Army Dallas NOVAK, MD;  Location: [facility] OR;  Service: Open Heart Surgery;  Laterality: N/A;   TEE WITHOUT CARDIOVERSION N/A 04/14/2017   Procedure: TRANSESOPHAGEAL ECHOCARDIOGRAM (TEE);  Surgeon: Rolan Ezra RAMAN, MD;  Location: [facility] ENDOSCOPY;  Service: Cardiovascular;  Laterality: N/A;    Family History  Problem Relation Age of Onset   Hypertension Mother    Mental illness Brother     Social History   Socioeconomic History   Marital status: Married    Spouse name: Not on file   Number of children: 2   Years of education: Not on file   Highest education level: Not on file  Occupational History   Occupation: Retired IT TRAINER  Tobacco Use   Smoking status: Never   Smokeless tobacco: Never   Tobacco comments:    Never smoke 02/17/22  Vaping Use   Vaping status: Never Used  Substance and Sexual Activity   Alcohol use: Yes    Comment: 2 glasses of wine or 2 beers - 2-3 times/ week 02/17/22   Drug use: No   Sexual activity: Not Currently  Other Topics Concern   Not on file  Social History Narrative   ** Merged History Encounter **       Social Drivers of Health   Tobacco Use: Low Risk (10/09/2024)   Patient History    Smoking Tobacco Use: Never    Smokeless  Tobacco Use: Never    Passive Exposure: Not on file  Financial Resource Strain: Not on file  Food Insecurity: Not on file  Transportation Needs: Not on file  Physical Activity: Not on file  Stress: Not on file  Social Connections: Not on file  Intimate Partner Violence: Not At Risk (04/29/2022)   Received from [facility]    Threatened: Not on file    Insulted: Not on file    Physically Hurt : Not on file    Scream: Not on file  Depression (EYV7-0): Not on file  Alcohol Screen: Not on file  Housing: Not on file  Utilities: Not on file  Health Literacy: Not on file    Prior to Admission medications  Medication Sig Start Date End Date Taking? Authorizing Provider  Calcium Citrate 250 MG TABS Take 1 tablet by mouth every morning.   Yes [provider]  Cholecalciferol (VITAMIN D3) 50 MCG (2000 UT) capsule Take 2,000 Units by mouth daily.   Yes [provider]  levothyroxine  (SYNTHROID ) 125 MCG tablet Take 1 tablet (125 mcg total) by mouth daily. 05/24/23  Yes   Multiple Vitamins-Minerals (ICAPS AREDS 2 PO) Take 1 capsule by mouth 2 (two) times daily.   Yes [provider]  sildenafil  (REVATIO ) 20 MG tablet Take 1-5 tablets (20-100 mg total) by mouth up to once a day as needed. 04/02/24  Yes   amoxicillin  (AMOXIL ) 500 MG capsule Take 4 capsules (2,000 mg total) by mouth 1 hour prior to dental procedure Patient not taking: Reported on 10/09/2024 06/29/23   Wyn Jackee VEAR Mickey., NP    Current Outpatient Medications  Medication Sig Dispense Refill   Calcium Citrate 250 MG TABS Take 1 tablet by mouth every morning.     Cholecalciferol (VITAMIN D3) 50 MCG (2000 UT) capsule Take 2,000 Units by mouth daily.     levothyroxine  (SYNTHROID ) 125 MCG tablet Take 1 tablet (125 mcg total) by mouth daily. 90 tablet 3   Multiple Vitamins-Minerals (ICAPS AREDS 2 PO) Take 1 capsule by mouth 2 (two) times daily.     sildenafil  (REVATIO ) 20 MG tablet Take 1-5 tablets  (20-100 mg total) by mouth up to once a day as needed. 30 tablet 1   amoxicillin  (AMOXIL ) 500 MG capsule Take 4 capsules (2,000 mg total) by mouth 1 hour prior to dental procedure (Patient not taking: Reported on 10/09/2024) 4 capsule 0   No current facility-administered medications for this visit.    Allergies[1]    Review of Systems:   General:  normal appetite, normal energy, no weight gain, no weight loss, no fever  Cardiac:  no chest pain with exertion, no chest pain at rest, no SOB with exertion, no resting SOB, no PND, no orthopnea, no palpitations, + arrhythmia, + atrial fibrillation, no LE edema, + dizzy spells, no syncope  Respiratory:  no shortness of breath, no home oxygen, no productive cough, no dry cough, no bronchitis, no wheezing, no hemoptysis, no asthma, no pain with inspiration or cough, no sleep apnea, no CPAP at night  GI:   no difficulty swallowing, no reflux, no frequent heartburn, no hiatal hernia, no abdominal pain, no constipation, no diarrhea, no hematochezia, no hematemesis, no melena  GU:   no dysuria,  no frequency, no urinary tract infection, no hematuria, no enlarged prostate, no kidney stones, no kidney disease  Vascular:  no pain suggestive of claudication, no pain in feet, no leg cramps, no varicose veins, no DVT, no non-healing foot ulcer  Neuro:   no stroke, no TIA's, no seizures, no headaches, no temporary blindness one eye,  no slurred speech, no peripheral neuropathy, no chronic pain, no instability of gait, no memory/cognitive dysfunction  Musculoskeletal: no arthritis, no joint swelling, no myalgias, no difficulty walking, normal mobility   Skin:   no rash, no itching, no skin infections, no pressure sores or ulcerations  Psych:   no anxiety, no depression, no nervousness, no unusual recent stress  Eyes:   no blurry vision, no floaters, no recent vision changes, no glasses or contacts  ENT:   + hearing loss, no loose or painful teeth, no dentures, sees  his dentist regularly  Hematologic:  no easy bruising, no abnormal bleeding, no clotting disorder, no frequent epistaxis  Endocrine:  no diabetes     Physical Exam:   BP (!) 148/77 (BP Location: Left Arm)   Pulse ROLLEN)  56   Resp 18   Ht 5' 7 (1.702 m)   Wt 137 lb (62.1 kg)   SpO2 100%   BMI 21.46 kg/m   General:  Thin,  well-appearing  HEENT:  Unremarkable, NCAT, PERLA, EOMI  Neck:   no JVD, no bruits, no adenopathy   Chest:   clear to auscultation, symmetrical breath sounds, no wheezes, no rhonchi   CV:   RRR, 3/6 systolic murmur RSB, no diastolic murmur  Abdomen:  soft, non-tender, no masses   Extremities:  warm, well-perfused, pedal pulses palpable, no lower extremity edema  Rectal/GU  Deferred  Neuro:   Grossly non-focal and symmetrical throughout  Skin:   Clean and dry, no rashes, no breakdown  Diagnostic Tests:  ECHOCARDIOGRAM REPORT       Patient Name:   James Mayo Date of Exam: 09/24/2024  Medical Rec #:  [id number]     Height:       67.0 in  Accession #:    [id number]    Weight:       134.0 lb  Date of Birth:  1951-08-24     BSA:          1.706 m  Patient Age:    73 years      BP:           114/70 mmHg  Patient Gender: M             HR:           51 bpm.  Exam Location:  [address]   Procedure: 2D Echo, Cardiac Doppler and Color Doppler (Both Spectral and  Color            Flow Doppler were utilized during procedure).   Indications:    Z95.2 S/p AVR    History:        Patient has prior history of Echocardiogram examinations,  most                 recent 01/25/2022. CAD, S/p AVR (25mm Magna pericardial  valve);                 Arrythmias:Atrial Fibrillation.    Sonographer:    Elsie Bohr RDCS  Referring Phys: [id number] ARTIST POUCH   IMPRESSIONS     1. Left ventricular ejection fraction, by estimation, is 60 to 65%. The  left ventricle has normal function. The left ventricle has no regional  wall motion abnormalities. There is severe left  ventricular hypertrophy of  the septal segment. Left  ventricular diastolic parameters were normal.   2. Right ventricular systolic function is normal. The right ventricular  size is mildly enlarged. There is normal pulmonary artery systolic  pressure. The estimated right ventricular systolic pressure is 24.9 mmHg.   3. Left atrial size was severely dilated.   4. Right atrial size was severely dilated.   5. The mitral valve is degenerative. Mild mitral valve regurgitation. No  evidence of mitral stenosis. Moderate mitral annular calcification.   6. S/p 25mm Magna pericardial valve, well seated, trivial valvular  regurgitation, no perivalvular leak, suspect either normal functioning  aortic valve prosthesis or patient prosthesis mismatch (peak velocity  3.27m/s, AT 93 msec, AT/ET ratio <0.37, DVI  0.32, EOAi 0.59cm2/m2).   7. Aortic root/ascending aorta has been repaired/replaced. There is  dilatation of the aortic root, measuring 41 mm. There is dilatation of the  ascending aorta, measuring 39 mm.   8. The inferior vena cava is  normal in size with greater than 50%  respiratory variability, suggesting right atrial pressure of 3 mmHg.   Comparison(s): A prior study was performed on 01/25/2022. LVEF 70-75%,  severe LVH, severe LAE, mild RAE, mild MR, s/p 25mm Magna pericardial  valve normal fucntion, aortic root 43mm, ascending aorta 38mm.   Conclusion(s)/Recommendation(s): Similar echo findings on prior studies  from 2021 and 2022. If clinically indicated could consider additional  imaging (i.e. cardiac CT, cardiac MRI, or TEE) for further evaluation.   FINDINGS   Left Ventricle: Left ventricular ejection fraction, by estimation, is 60  to 65%. The left ventricle has normal function. The left ventricle has no  regional wall motion abnormalities. The left ventricular internal cavity  size was normal in size. There is   severe left ventricular hypertrophy of the septal segment. Left   ventricular diastolic parameters were normal.   Right Ventricle: The right ventricular size is mildly enlarged. No  increase in right ventricular wall thickness. Right ventricular systolic  function is normal. There is normal pulmonary artery systolic pressure.  The tricuspid regurgitant velocity is 2.34   m/s, and with an assumed right atrial pressure of 3 mmHg, the estimated  right ventricular systolic pressure is 24.9 mmHg.   Left Atrium: Left atrial size was severely dilated.   Right Atrium: Right atrial size was severely dilated.   Pericardium: There is no evidence of pericardial effusion.   Mitral Valve: The mitral valve is degenerative in appearance. Moderate  mitral annular calcification. Mild mitral valve regurgitation. No evidence  of mitral valve stenosis.   Tricuspid Valve: The tricuspid valve is grossly normal. Tricuspid valve  regurgitation is mild . No evidence of tricuspid stenosis.   Aortic Valve: S/p 25mm Magna pericardial valve, well seated, trivial  valvular regurgitation, no perivalvular leak, suspect either normal  functioning aortic valve prosthesis or patient prosthesis mismatch (peak  velocity 3.37m/s, AT 93 msec, AT/ET ratio  <0.37, DVI 0.32, EOAi 0.59cm2/m2). Aortic valve mean gradient measures  29.2 mmHg. Aortic valve peak gradient measures 51.6 mmHg. Aortic valve  area, by VTI measures 1.01 cm.   Pulmonic Valve: The pulmonic valve was grossly normal. Pulmonic valve  regurgitation is trivial. No evidence of pulmonic stenosis.   Aorta: The aortic root/ascending aorta has been repaired/replaced. There  is dilatation of the aortic root, measuring 41 mm. There is dilatation of  the ascending aorta, measuring 39 mm.   Venous: The inferior vena cava is normal in size with greater than 50%  respiratory variability, suggesting right atrial pressure of 3 mmHg.   IAS/Shunts: The atrial septum is grossly normal.     LEFT VENTRICLE  PLAX 2D  LVIDd:          4.60 cm   Diastology  LVIDs:         3.00 cm   LV e' medial:    7.51 cm/s  LV PW:         1.20 cm   LV E/e' medial:  10.0  LV IVS:        2.00 cm   LV e' lateral:   14.27 cm/s  LVOT diam:     2.00 cm   LV E/e' lateral: 5.3  LV SV:         80  LV SV Index:   47  LVOT Area:     3.14 cm     RIGHT VENTRICLE            IVC  RV S prime:  9.89 cm/s  IVC diam: 1.00 cm  TAPSE (M-mode): 1.9 cm  RVSP:           24.9 mmHg  PULMONARY VEINS                             Diastolic Velocity: 45.70 cm/s                             S/D Velocity:       1.00                             Systolic Velocity:  47.50 cm/s   LEFT ATRIUM              Index        RIGHT ATRIUM           Index  LA diam:        5.80 cm  3.40 cm/m   RA Pressure: 3.00 mmHg  LA Vol (A2C):   87.0 ml  51.00 ml/m  RA Area:     24.70 cm  LA Vol (A4C):   108.0 ml 63.32 ml/m  RA Volume:   83.30 ml  48.83 ml/m  LA Biplane Vol: 99.7 ml  58.45 ml/m   AORTIC VALVE  AV Area (Vmax):    0.87 cm  AV Area (Vmean):   0.94 cm  AV Area (VTI):     1.01 cm  AV Vmax:           359.20 cm/s  AV Vmean:          249.200 cm/s  AV VTI:            0.791 m  AV Peak Grad:      51.6 mmHg  AV Mean Grad:      29.2 mmHg  LVOT Vmax:         99.36 cm/s  LVOT Vmean:        74.580 cm/s  LVOT VTI:          0.255 m  LVOT/AV VTI ratio: 0.32    AORTA  Ao Root diam: 4.10 cm  Ao Asc diam:  3.90 cm   MITRAL VALVE               TRICUSPID VALVE  MV Area (PHT): 3.96 cm    TR Peak grad:   21.9 mmHg  MV Decel Time: 191 msec    TR Vmax:        234.00 cm/s  MV E velocity: 75.06 cm/s  Estimated RAP:  3.00 mmHg  MV A velocity: 44.56 cm/s  RVSP:           24.9 mmHg  MV E/A ratio:  1.68                             SHUNTS                             Systemic VTI:  0.26 m                             Systemic Diam: 2.00 cm   Sunit Tolia  Electronically signed by Madonna Large  Signature Date/Time: 09/24/2024/5:55:12  PM        Final     Narrative &  Impression  CLINICAL DATA:  Bioprosthetic aortic valve stenosis. S/p AVR with 25 Magna Ease and ascending aortic repair with 32 mm graft 03/06/2017.   EXAM: Cardiac TAVR CT   TECHNIQUE: A non-contrast, gated CT scan was obtained with axial slices of 2.5 mm through the heart for aortic valve scoring. A 100 kV retrospective, gated, contrast cardiac scan was obtained. Gantry rotation speed was 230 msec and collimation was 0.63 mm. Nitroglycerin  was not given. The 3D dataset was reconstructed in systole with motion correction. The 3D data set was reconstructed in 5% intervals of the 0-95% of the R-R cycle. Systolic and diastolic phases were analyzed on a dedicated workstation using MPR, MIP, and VRT modes. The patient received 100 cc of contrast.   FINDINGS: Image quality: Excellent.   Noise artifact is: Limited.   Aortic valve: A 25 mm bioprosthetic aortic valve is present in the aortic position. The leaflets are thickened. The leaflet in the NCC position is severely restricted consistent with structural deterioration.   Valve in valve analysis: A valve in valve analysis was performed using a 26 mm S3 virtual transcatheter valve. The virtual valve to coronary distance (VTC) for the left main coronary artery was 9.6 mm. The VTC for the RCA was 11.0 mm. Both indicate low risk for coronary obstruction.   Deployment angle: LAO 16 CAU 24   Sinus of Valsalva Measurements:   Non-coronary: 48 mm   Right-coronary: 42 mm   Left-coronary: 44 mm   NCC to RCC: 49 mm (largest diameter)   Sinotubular Junction: 40 mm   Ascending Thoracic Aorta: S/p repair. Up to 35 mm. Normal appearance of repair.   Coronary Arteries:  Normal coronary origin.  Left dominance.   Left main: The left main is a large caliber vessel with a normal take off from the left coronary cusp that trifurcates into a LAD, LCX, and ramus intermedius. There is no plaque or stenosis.   Left anterior descending  artery: The proximal LAD contains minimal calcified plaque (<25%). The mid and distal segments are patent. The LAD gives off 1 patent diagonal branches.   Ramus intermedius: Patent with no evidence of plaque or stenosis.   Left circumflex artery: The LCX is dominant. There is minimal calcified plaque (<25%). The LCX gives off 2 patent obtuse marginal branches. The LCX terminates as a patent PDA.   Right coronary artery: The RCA is non-dominant with normal take off from the right coronary cusp. There is minimal calcified plaque (<25%).   Cardiac Morphology:   Right Atrium: Right atrial size is dilated.   Right Ventricle: The right ventricular cavity is dilated.   Left Atrium: Left atrial size is dilated with no left atrial appendage filling defect.   Left Ventricle: The ventricular cavity size is within normal limits. Normal left ventricular systolic function, LVEF 65%. No wall motion abnormalities.   Pulmonary arteries: Normal in size without proximal filling defect.   Pulmonary veins: Normal pulmonary venous drainage.   Pericardium: Normal thickness with no significant effusion or calcium present.   Mitral Valve: The mitral valve is thickened and billowing, but no prolapse.   Extra-cardiac findings: See attached radiology report for non-cardiac structures.   IMPRESSION: 1. 25 mm bioprosthetic aortic valve is present with restricted leaflet movement and evidence of structural deterioration.   2. Valve in valve analysis favorable for 26 mm S3.   3. Optimal Fluoroscopic Angle  for Delivery: LAO 16 CAU 24   4. 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   5. S/p ascending aortic repair with stable appearance of repair.   6. Minimal CAD (<25%).   7. Normal left ventricular systolic function, LVEF 65%. No wall motion abnormalities.   Darryle T. Barbaraann, MD     Electronically Signed   By: Darryle Barbaraann M.D.   On: 10/08/2024 19:29     Narrative & Impression  CLINICAL  DATA:  Prosthetic aortic valve stenosis. Evaluate vascular anatomy for potential TAVR procedure.   EXAM: CT ANGIOGRAPHY CHEST, ABDOMEN AND PELVIS   TECHNIQUE: Non-contrast CT of the chest was initially obtained.   Multidetector CT imaging through the chest, abdomen and pelvis was performed using the standard protocol during bolus administration of intravenous contrast. Multiplanar reconstructed images and MIPs were obtained and reviewed to evaluate the vascular anatomy.   RADIATION DOSE REDUCTION: This exam was performed according to the departmental dose-optimization program which includes automated exposure control, adjustment of the mA and/or kV according to patient size and/or use of iterative reconstruction technique.   CONTRAST:  OMNIPAQUE  IOHEXOL  350 MG/ML SOLN   COMPARISON:  Chest CTA 09/24/2024 and chest CT 05/29/2023   FINDINGS: CTA CHEST FINDINGS   Cardiovascular: Prosthetic aortic valve with replacement of the ascending thoracic aorta with a surgical graft. Surgical graft is patent. Aortic root is enlarged measuring approximately 4.9 cm. Conventional three-vessel arch anatomy. The arch great vessels are patent. Proximal vertebral arteries are patent. Proximal descending thoracic aorta measures 3.2 cm and stable. Negative for an aortic dissection. Distal descending thoracic aorta is tortuous. Enlarged left atrium measuring up to 5.3 cm in the AP dimension. Limited evaluation of the pulmonary arteries due to the phase of contrast. Heart is enlarged without significant pericardial fluid.   Mediastinum/Nodes: Esophagus unremarkable. No significant mediastinal, hilar or axillary lymph node enlargement.   Lungs/Pleura: Trachea and mainstem bronchi are patent.New hazy densities at the lung bases could represent atelectasis. Again noted are small focal parenchymal densities in the medial right upper lobe on image 57/310 that were present on the recent  comparison examination but new since 2024. No pleural effusions.   Musculoskeletal: Status post median sternotomy.   Review of the MIP images confirms the above findings.   CTA ABDOMEN AND PELVIS FINDINGS   VASCULAR   Aorta: Abdominal aorta is very tortuous with a S-shaped configuration. Aorta is widely patent with minimal atherosclerotic disease. Infrarenal abdominal aorta measures up to 3.2 cm just above the bifurcation.   Celiac: Patent without evidence of aneurysm, dissection, vasculitis or significant stenosis.   SMA: Patent without evidence of aneurysm, dissection, vasculitis or significant stenosis.   Renals: Both renal arteries are patent without evidence of aneurysm, dissection, vasculitis, fibromuscular dysplasia or significant stenosis.   IMA: Patent without evidence of aneurysm, dissection, vasculitis or significant stenosis.   Inflow: Bilateral iliac arteries are very tortuous. Right common iliac artery measures up to 2.3 cm. Acute angulation in the bilateral external iliac arteries due to the tortuosity. Left common iliac artery measures 1.7 cm. No evidence for dissection.   Veins: IVC is prominent for size measuring 2.8 cm in diameter. Limited evaluation of the venous structures due to the arterial phase of contrast.   Review of the MIP images confirms the above findings.   NON-VASCULAR   Hepatobiliary: The gallbladder is mildly distended without inflammatory changes.   Pancreas: 1.1 cm low-density structure in the pancreatic body that appears to be coming  off the main pancreatic duct. 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 Main pancreatic duct is mildly dilated measuring 6 mm. No acute inflammatory changes involving the pancreas.   Spleen: Normal in size without focal abnormality.   Adrenals/Urinary Tract: Adrenal glands are within normal limits. No suspicious renal lesion. Evidence for bilateral renal cysts that do not require dedicated  follow-up. No hydronephrosis. Bladder is distended with bladder wall trabeculations and multiple diverticula. 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   Stomach/Bowel: Normal appearance of the stomach. No bowel dilatation or obstruction. No acute bowel inflammation.   Lymphatic: No lymph node enlargement in the abdomen or pelvis.   Reproductive: Enlarged prostate with calcifications.   Other: Negative for ascites.  Negative for free air.   Musculoskeletal: Old posterior right rib fractures. Surgical hardware in the proximal left femur. Grade 1 anterolisthesis of L4 on L5.   Review of the MIP images confirms the above findings.   IMPRESSION: 1. 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 Surgical graft involving the ascending thoracic aorta is patent. 2. Negative for aortic dissection or stenosis. 3. 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 Significant tortuosity of the bilateral iliac arteries with acute angulation involving the bilateral external iliac arteries. 4. 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 Recommend follow-up ultrasound every 3 years. (Ref.: J Vasc Surg. 2018; 67:2-77 and J Am Coll Radiol 2013;10(10):789-794.) 5. 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 6. No acute abnormality in the chest, abdomen or pelvis. 7. Hazy densities at the lung bases probably represent atelectasis. Small densities in the medial right upper lung were probably present on the exam from 09/24/2024. These could be post inflammatory or post infectious etiology. 8. 1.1 cm low-density cystic structure in the pancreatic body. 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 Recommend follow up pre and post contrast MRI/MRCP or pancreatic protocol CT in 2 years. This recommendation follows ACR consensus guidelines: Management of Incidental Pancreatic Cysts: A White Paper of the ACR  Incidental Findings Committee. J Am Coll Radiol 2017;14:911-923. 9. Bladder wall trabeculations with multiple bladder diverticula. Prostate enlargement. 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     Electronically Signed   By: Juliene Balder M.D.   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 The aortic root measured 4.4 cm at that time with the STJ at 4.0 cm and the ascending aorta at 5.1 cm. 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 His mean gradient in 2023 was measured at 13.6 mm Hg but in 2022 it was 27 mm Hg and in 2021 it was 33 mm Hg. The mean gradient on his first postop echo on 04/14/2017, about 6 weeks postop was 10 mm Hg and then on 07/07/2017 it was 20 mm Hg. I have a difficult time making sense of these varying results. His current gated cardiac CT shows restricted mobility of the valve. He is essentially asymptomatic except for occasional dizziness with bending over. He still exercises every day without a change in his exertional tolerance except a very gradual decline over the years. His cardiac CTA shows an aortic root diameter of 4.9 cm which has increased from 4.4 cm in 2018. He is  74 years old and the risk of redo sternotomy for Bentall procedure is significant. In addition he has diffuse arterio-megaly and tortuosity. I have recommended valve in valve TAVR using a 26 mm Sapien 3 valve since this will correct the problem with significantly less surgical risk including bleeding, blood transfusion, heart block requiring a PPM, coronary anastomotic pseudoaneurysm or kinking and ischemia, stroke, organ dysfunction and death. His abdominal and pelvic CTA shows large tortuous aorto-iliac vessels that could increase the difficulty of  insertion but he does have adequate left subclavian artery access if needed.   The patient and his wife were counseled at length regarding treatment alternatives for management of severe symptomatic aortic stenosis. The risks and benefits of surgical intervention has been discussed in detail. Long-term prognosis with medical therapy was discussed. Alternative approaches such as conventional surgical aortic valve replacement, transcatheter aortic valve replacement, and palliative medical therapy were compared and contrasted at length. This discussion was placed in the context of the patient's own specific clinical presentation and past medical history. All of their questions have been addressed.   Following the decision to proceed with transcatheter aortic valve replacement, a discussion was held regarding what types of management strategies would be attempted intraoperatively in the event of life-threatening complications, including whether or not the patient would be considered a candidate for the use of cardiopulmonary bypass and/or conversion to open sternotomy for attempted surgical intervention. I don't think he is a candidate for emergent sternotomy given his prior cardiac surgery. The patient has been advised of a variety of complications that might develop including but not limited to risks of death, stroke, paravalvular leak, aortic dissection or other major vascular complications, aortic annulus rupture, device embolization, cardiac rupture or perforation, mitral regurgitation, acute myocardial infarction, arrhythmia, heart block or bradycardia requiring permanent pacemaker placement, congestive heart failure, respiratory failure, renal failure, pneumonia, infection, other late complications related to structural valve deterioration or migration, or other complications that might ultimately cause a temporary or permanent loss of functional independence or other long term morbidity. The patient provides  full informed consent for the procedure as described and all questions were answered.      Plan:  He will be discussed in detail at our multidisciplinary heart valve team meeting before making a final decision about treatment.    Dorise LOIS Fellers, MD 10/09/2024 12:37 PM       [1]  Allergies Allergen Reactions   Levofloxacin  Other (See Comments)    Potential tendon tear/calf pain

## 2024-10-09 NOTE — Progress Notes (Signed)
 Pre Surgical Assessment: 5 M Walk Test  44M=16.43ft  5 Meter Walk Test- trial 1: 4.85 seconds 5 Meter Walk Test- trial 2: 6.63 seconds 5 Meter Walk Test- trial 3: 5.93 seconds 5 Meter Walk Test Average: 5.80 seconds

## 2024-10-09 NOTE — Progress Notes (Signed)
 Procedure Type: Isolated AVR Perioperative Outcome Estimate % Operative Mortality 1.81% Morbidity & Mortality 9.3% Stroke 1.33% Renal Failure 0.957% Reoperation 6.29% Prolonged Ventilation 3.58% Deep Sternal Wound Infection 0.07% Long Hospital Stay (>14 days) 3.82% Short Hospital Stay (<6 days)* 42.4%

## 2024-10-16 ENCOUNTER — Ambulatory Visit: Admitting: Cardiovascular Disease

## 2024-10-16 ENCOUNTER — Telehealth: Payer: Self-pay | Admitting: Cardiovascular Disease

## 2024-10-16 NOTE — Telephone Encounter (Signed)
 Called [facility].  Discussed our meeting from the structural heart team.  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  He is not having any symptoms.  This will be a rather large procedure.  Given his lack of symptoms our meeting with the structural heart team has concluded that it would be better to wait for symptoms to then want the risk for redo open heart surgery.  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  I discussed this with Mr. Vitanza and his wife in great detail.  They are in agreement with our plan.  He will see me back in July with a repeat echocardiogram before that appointment.  We will follow his heart valve closely.  If this does worsen he may need to consider surgery before symptoms do develop but we will follow him closely.  He was quite aware to look out for shortness of breath and symptoms of chest pain or exercise intolerance.  He will keep us  updated.  Signed, Darryle DASEN. Barbaraann, MD, [facility]  [facility]  [address] [place], [place] [address] [phone]  11:08 AM
